# Patient Record
Sex: Female | Born: 1950 | Race: White | Hispanic: No | Marital: Married | Smoking: Never smoker
Health system: Southern US, Community
[De-identification: ages and names within clinical notes are randomized; demographics above are authoritative.]

## PROBLEM LIST (undated history)

## (undated) DIAGNOSIS — G709 Myoneural disorder, unspecified: Secondary | ICD-10-CM

## (undated) DIAGNOSIS — I10 Essential (primary) hypertension: Secondary | ICD-10-CM

## (undated) DIAGNOSIS — M1990 Unspecified osteoarthritis, unspecified site: Secondary | ICD-10-CM

## (undated) DIAGNOSIS — H539 Unspecified visual disturbance: Secondary | ICD-10-CM

## (undated) DIAGNOSIS — Z8489 Family history of other specified conditions: Secondary | ICD-10-CM

## (undated) DIAGNOSIS — F32A Depression, unspecified: Secondary | ICD-10-CM

## (undated) DIAGNOSIS — M79671 Pain in right foot: Secondary | ICD-10-CM

## (undated) DIAGNOSIS — G4700 Insomnia, unspecified: Secondary | ICD-10-CM

## (undated) DIAGNOSIS — E785 Hyperlipidemia, unspecified: Secondary | ICD-10-CM

## (undated) DIAGNOSIS — Z5189 Encounter for other specified aftercare: Secondary | ICD-10-CM

## (undated) DIAGNOSIS — R413 Other amnesia: Secondary | ICD-10-CM

## (undated) DIAGNOSIS — F329 Major depressive disorder, single episode, unspecified: Secondary | ICD-10-CM

## (undated) DIAGNOSIS — H269 Unspecified cataract: Secondary | ICD-10-CM

## (undated) HISTORY — DX: Unspecified osteoarthritis, unspecified site: M19.90

## (undated) HISTORY — DX: Myoneural disorder, unspecified: G70.9

## (undated) HISTORY — PX: FRACTURE SURGERY: SHX138

## (undated) HISTORY — DX: Major depressive disorder, single episode, unspecified: F32.9

## (undated) HISTORY — DX: Other amnesia: R41.3

## (undated) HISTORY — DX: Depression, unspecified: F32.A

## (undated) HISTORY — DX: Insomnia, unspecified: G47.00

## (undated) HISTORY — DX: Unspecified visual disturbance: H53.9

## (undated) HISTORY — DX: Encounter for other specified aftercare: Z51.89

## (undated) HISTORY — DX: Hyperlipidemia, unspecified: E78.5

## (undated) HISTORY — DX: Pain in right foot: M79.671

## (undated) HISTORY — PX: BREAST BIOPSY: SHX20

## (undated) HISTORY — PX: ROTATOR CUFF REPAIR: SHX139

## (undated) HISTORY — PX: EYE SURGERY: SHX253

## (undated) HISTORY — PX: TUBAL LIGATION: SHX77

## (undated) HISTORY — PX: REDUCTION MAMMAPLASTY: SUR839

## (undated) HISTORY — PX: COLONOSCOPY W/ POLYPECTOMY: SHX1380

## (undated) HISTORY — PX: TONSILLECTOMY: SHX5217

---

## 2005-05-06 HISTORY — PX: BREAST REDUCTION SURGERY: SHX8

## 2006-05-06 HISTORY — PX: COLONOSCOPY: SHX174

## 2011-05-07 HISTORY — PX: COLONOSCOPY W/ POLYPECTOMY: SHX1380

## 2011-11-15 HISTORY — PX: COLONOSCOPY W/ POLYPECTOMY: SHX1380

## 2011-11-15 LAB — HM COLONOSCOPY

## 2014-06-24 ENCOUNTER — Encounter: Payer: Self-pay | Admitting: Podiatrist

## 2014-06-24 ENCOUNTER — Ambulatory Visit (INDEPENDENT_AMBULATORY_CARE_PROVIDER_SITE_OTHER): Payer: BLUE CROSS/BLUE SHIELD | Admitting: Podiatrist

## 2014-06-24 ENCOUNTER — Ambulatory Visit (INDEPENDENT_AMBULATORY_CARE_PROVIDER_SITE_OTHER): Payer: BLUE CROSS/BLUE SHIELD

## 2014-06-24 VITALS — BP 154/87 | HR 69 | Resp 16

## 2014-06-24 DIAGNOSIS — S92302A Fracture of unspecified metatarsal bone(s), left foot, initial encounter for closed fracture: Secondary | ICD-10-CM

## 2014-06-24 DIAGNOSIS — M8430XA Stress fracture, unspecified site, initial encounter for fracture: Secondary | ICD-10-CM

## 2014-06-24 NOTE — Patient Instructions (Signed)
Metatarsal Stress Fracture When too much stress is put on the foot, as in running and jumping sports, the center shaft of the bones of the forefoot is very susceptible to stress fractures (break in bone). This is because of repetitive stress on the bone. This injury is more common if osteoporosis is present or if inadequate running shoes are used. Rapid increase in running distances are often the cause. Running distances should be gradually increased to avoid this problem. Shoes should be used which adequately cushion the foot. Shoes should absorb the shocks of the activity.  DIAGNOSIS  Usually the diagnosis is made by history. The foot progressively becomes sorer with activities. X-rays may be negative (show no break) within the first 2 to 3 weeks of the beginning of pain. A later X-ray may show signs of healing bone (callus formation). A bone scan or MRI will usually make the diagnosis earlier. TREATMENT AND HOME CARE INSTRUCTIONS  Treatment may or may not include a cast, removable fracture boot, or walking shoe. Casts are used for short periods of time to prevent muscle atrophy (muscle wasting).  Activities should be stopped until further advised by your caregiver.  Wear shoes with adequate shock absorbing abilities.  Alternative exercise may be undertaken while waiting for healing. These may include bicycling and swimming, or as your caregiver suggests. If you do not have a cast or splint:  You may walk on your injured foot as tolerated or advised.  Do not put any weight on your injured foot for as long as directed by your caregiver. Slowly increase the amount of time you walk on the foot as the pain allows or as advised.  Use crutches until you can bear weight without pain. A gradual increase in weight bearing may help.  Apply ice to the injury for 15-20 minutes each hour while awake for the first 2 days. Put the ice in a plastic bag and place a towel between the bag of ice and your  skin.  Only take over-the-counter or prescription medicines for pain, discomfort, or fever as directed by your caregiver. SEEK IMMEDIATE MEDICAL CARE IF:   Pain is becoming worse rather than better, or if pain is uncontrolled with medications.  You have increased swelling or redness in the foot. MAKE SURE YOU:   Understand these instructions.  Will watch your condition.  Will get help right away if you are not doing well or get worse. Document Released: 04/19/2000 Document Revised: 07/15/2011 Document Reviewed: 02/16/2008 ExitCare Patient Information 2015 [employer]. This information is not intended to replace advice given to you by your health care provider. Make sure you discuss any questions you have with your health care provider.  

## 2014-06-24 NOTE — Progress Notes (Signed)
   Subjective:    Patient ID: Brittany Salinas, female    DOB: 09-27-1950, 64 y.o.   MRN: [id number]  HPI Comments: "I was hiking and hurt my foot"  Patient c/o aching forefoot left since December 2015. She had went hiking and come home and noticed foot was hurting. Went to doc-xrayed said had fracture. She was in a boot a little over 1 month. Tried to go without the boot, was still very painful. Takes Advil prn.     Review of Systems  Eyes: Positive for visual disturbance.  Gastrointestinal: Positive for constipation.  Musculoskeletal: Positive for arthralgias.  Allergic/Immunologic: Positive for environmental allergies.  All other systems reviewed and are negative.      Objective:   Physical Exam  Patient is awake, alert, and oriented x 3.  In no acute distress.  Vascular status is intact with palpable pedal pulses at 2/4 DP and PT bilateral and capillary refill time within normal limits. Neurological sensation is also intact bilaterally via Semmes Weinstein monofilament at 5/5 sites. Light touch, vibratory sensation, Achilles tendon reflex is intact no neuroma type symptomatology is elicited.. Dermatological exam reveals skin color, turger and texture as normal. No open lesions present.  Musculature intact with dorsiflexion, plantarflexion, inversion, eversion. Pain on palpation submetatarsal 2 and 3 of the left foot are noted with pressure. No pain with tuning fork is noted. X-rays are suspicious for stress fracture second metatarsal neck left however unable to be confirmed. She states she was diagnosed previously with a fracture and therefore it likely was a stress fracture that gone on to heal.     Assessment & Plan:  Healing stress fracture left foot  Plan: Recommended continuation of the boot for 2-4 more weeks and then weaning slowly into a good supportive hiking shoe or sneaker. If her foot continues to be painful she will call and we may need to consider an MRI.

## 2014-10-12 ENCOUNTER — Other Ambulatory Visit (HOSPITAL_COMMUNITY): Payer: Self-pay | Admitting: Family Medicine

## 2014-10-12 DIAGNOSIS — Z1231 Encounter for screening mammogram for malignant neoplasm of breast: Secondary | ICD-10-CM

## 2014-10-19 ENCOUNTER — Other Ambulatory Visit (HOSPITAL_COMMUNITY): Payer: Self-pay | Admitting: Family Medicine

## 2014-10-19 ENCOUNTER — Ambulatory Visit (HOSPITAL_COMMUNITY)
Admission: RE | Admit: 2014-10-19 | Discharge: 2014-10-19 | Disposition: A | Discharge status: Home / Self Care | Payer: BLUE CROSS/BLUE SHIELD | Source: Ambulatory Visit | Attending: Family Medicine | Admitting: Family Medicine

## 2014-10-19 DIAGNOSIS — Z1231 Encounter for screening mammogram for malignant neoplasm of breast: Secondary | ICD-10-CM | POA: Insufficient documentation

## 2015-10-23 ENCOUNTER — Other Ambulatory Visit: Payer: Self-pay | Admitting: Family Medicine

## 2015-10-23 DIAGNOSIS — Z1231 Encounter for screening mammogram for malignant neoplasm of breast: Secondary | ICD-10-CM

## 2015-11-08 ENCOUNTER — Ambulatory Visit
Admission: RE | Admit: 2015-11-08 | Discharge: 2015-11-08 | Disposition: A | Discharge status: Home / Self Care | Payer: BLUE CROSS/BLUE SHIELD | Source: Ambulatory Visit | Attending: Family Medicine | Admitting: Family Medicine

## 2015-11-08 DIAGNOSIS — Z1231 Encounter for screening mammogram for malignant neoplasm of breast: Secondary | ICD-10-CM

## 2016-03-07 ENCOUNTER — Other Ambulatory Visit: Payer: Self-pay | Admitting: *Deleted

## 2016-03-07 DIAGNOSIS — M8580 Other specified disorders of bone density and structure, unspecified site: Secondary | ICD-10-CM

## 2016-03-13 ENCOUNTER — Other Ambulatory Visit: Payer: Self-pay | Admitting: Family Medicine

## 2016-03-13 ENCOUNTER — Ambulatory Visit
Admission: RE | Admit: 2016-03-13 | Discharge: 2016-03-13 | Disposition: A | Discharge status: Home / Self Care | Payer: BLUE CROSS/BLUE SHIELD | Source: Ambulatory Visit | Attending: Internal Medicine | Admitting: Internal Medicine

## 2016-03-13 DIAGNOSIS — M8580 Other specified disorders of bone density and structure, unspecified site: Secondary | ICD-10-CM

## 2016-04-18 ENCOUNTER — Encounter: Payer: Self-pay | Admitting: Neurology

## 2016-04-18 ENCOUNTER — Ambulatory Visit (INDEPENDENT_AMBULATORY_CARE_PROVIDER_SITE_OTHER): Payer: BLUE CROSS/BLUE SHIELD | Admitting: Neurology

## 2016-04-18 DIAGNOSIS — R269 Unspecified abnormalities of gait and mobility: Secondary | ICD-10-CM | POA: Diagnosis not present

## 2016-04-18 DIAGNOSIS — R413 Other amnesia: Secondary | ICD-10-CM | POA: Diagnosis not present

## 2016-04-18 DIAGNOSIS — R2681 Unsteadiness on feet: Secondary | ICD-10-CM | POA: Insufficient documentation

## 2016-04-18 DIAGNOSIS — R4189 Other symptoms and signs involving cognitive functions and awareness: Secondary | ICD-10-CM | POA: Insufficient documentation

## 2016-04-18 DIAGNOSIS — F418 Other specified anxiety disorders: Secondary | ICD-10-CM | POA: Diagnosis not present

## 2016-04-18 DIAGNOSIS — G4700 Insomnia, unspecified: Secondary | ICD-10-CM | POA: Diagnosis not present

## 2016-04-18 DIAGNOSIS — F329 Major depressive disorder, single episode, unspecified: Secondary | ICD-10-CM

## 2016-04-18 HISTORY — DX: Other symptoms and signs involving cognitive functions and awareness: R41.89

## 2016-04-18 HISTORY — DX: Unspecified abnormalities of gait and mobility: R26.9

## 2016-04-18 HISTORY — DX: Major depressive disorder, single episode, unspecified: F32.9

## 2016-04-18 NOTE — Progress Notes (Addendum)
[facility]  PATIENT: Brittany Salinas DOB: 1951/02/29  REFERRING DOCTOR OR PCP:  Ann Maki [facility]) SOURCE: patient, notes and labs from Dr. Florene Glen  _________________________________   HISTORICAL  CHIEF COMPLAINT:  Chief Complaint  Patient presents with  . Memory Loss    Sts. she has noticed more difficulty recalling past events (for ex. family vacations, family members who are deceased) over the last 4-5 yrs.  Sts. she also has difficulty remembering upcoming events. "I make lists all the time just because I'm afraid I'm going to forget something important."  Sts. she has had intermitten dizziness onset about 6 mos. ago.  Mainly when she is walking and makes a sudden turn.  Sometimes with sitting from a lying position.  MOCA and orthosatic VS done today/fim  . Dizziness    HISTORY OF PRESENT ILLNESS:  I had the pleasure seeing you patient, Brittany Salinas, at [facility] for neurologic consultation regarding her memory difficulties.   As you know, she is a 65 year old woman who has had more difficulty with memory over the last couple of years.   She has noted difficulty remembering recent events and she has trouble planning and executing a plan.   She needs to write down lists.   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    Often, if she has trouble remembering an item, she will recall with a hint or cue.   She used to read a lot but she can't recall titles and authors.  She is not getting lost but she sometimes feels she is in unfamiliar location while driving and needs to think more to get to her destination.    She has not left appliances on.    She remembers to lock her door.  She has had depression and has been off/on antidepressants x 30 years.   She stops when she feels better and then a few years later may restart.    The last time, Wellbutrin was  added 3 years ago when she felt more depressed.  She had loss of interest in pleasurable activities, apathy, increased appetite.     She is not sleeping well. Melatonin and magnesium have helped.   She averages 7 hours sleep/night.    She had left shoulder pain and had difficulty getting comfortable.   This is better but sleep is still poor. She falls asleep easily but has trouble getting back to sleep.   Sleep is wore than a few years ago.   She snores some but has never had OSA symptoms of gasping or pauses (husband has OSA).  She denies any major difficulties with her gait is sometimes feels a little off balance. She denies any difficulty with her bladder.  She has no major medical issues.    No thyroid disease or DM.    Montreal cognitive assessment was administered. She scored 26/30 losing 3 points for delayed recall. However, with category cues she was 5/5. She also lost a point for difficulty with visual spatial task.  Montreal Cognitive Assessment  04/18/2016  Visuospatial/ Executive (0/5) 4  Naming (0/3) 3  Attention: Read list of digits (0/2) 2  Attention: Read list of letters (0/1) 1  Attention: Serial 7 subtraction starting at 100 (0/3) 3  Language: Repeat phrase (0/2) 2  Language : Fluency (0/1) 1  Abstraction (0/2) 2  Delayed Recall (0/5) 2  Orientation (0/6) 6  Total 26  Adjusted Score (based on education) 26     REVIEW OF SYSTEMS: Constitutional: No fevers, chills, sweats, or change in appetite.   Insomnia Eyes: No visual changes, Salinas vision, eye pain Ear, nose and throat: No hearing loss, ear pain, nasal congestion, sore throat Cardiovascular: No chest pain, palpitations Respiratory: No shortness of breath at rest or with exertion.   No wheezes GastrointestinaI: No nausea, vomiting, diarrhea, abdominal pain, fecal incontinence Genitourinary: No dysuria, urinary retention or frequency.  No nocturia. Musculoskeletal: No neck pain, back pain Integumentary: No  rash, pruritus, skin lesions Neurological: as above Psychiatric: Notes depression at this time.  No anxiety Endocrine: No palpitations, diaphoresis, change in appetite, change in weigh or increased thirst Hematologic/Lymphatic: No anemia, purpura, petechiae. Allergic/Immunologic: No itchy/runny eyes, nasal congestion, recent allergic reactions, rashes  ALLERGIES: Allergies  Allergen Reactions  . Codeine     HOME MEDICATIONS:  Current Outpatient Prescriptions:  .  aspirin 81 MG tablet, Take 81 mg by mouth daily., Disp: , Rfl:  .  buPROPion (WELLBUTRIN XL) 300 MG 24 hr tablet, , Disp: , Rfl:  .  Multiple Vitamin (MULTIVITAMIN) capsule, Take 1 capsule by mouth daily., Disp: , Rfl:  .  NIACIN PO, Take by mouth., Disp: , Rfl:  .  Omega-3 Fatty Acids (FISH OIL PO), Take by mouth., Disp: , Rfl:  .  Red Yeast Rice Extract (RED YEAST RICE PO), Take by mouth., Disp: , Rfl:  .  DULoxetine (CYMBALTA) 60 MG capsule, Take 60 mg by mouth daily., Disp: , Rfl:   PAST MEDICAL HISTORY: Past Medical History:  Diagnosis Date  . Memory loss   . Vision abnormalities     PAST SURGICAL HISTORY: Past Surgical History:  Procedure Laterality Date  . BREAST REDUCTION SURGERY  2007  . CESAREAN SECTION     4 c-sections  . ROTATOR CUFF REPAIR Bilateral    right in 2010, left in 2017.    FAMILY HISTORY: Family History  Problem Relation Age of Onset  . Heart defect Mother   . Heart attack Father     SOCIAL HISTORY:  Social History   Social History  . Marital status: Married    Spouse name: N/A  . Number of children: N/A  . Years of education: N/A   Occupational History  . Not on file.   Social History Main Topics  . Smoking status: Never Smoker  . Smokeless tobacco: Not on file  . Alcohol use 0.0 oz/week  . Drug use: Unknown  . Sexual activity: Not on file   Other Topics Concern  . Not on file   Social History Narrative  . No narrative on file     PHYSICAL EXAM  Vitals:     04/18/16 1043  BP: (!) 158/90  Pulse: 64  Resp: 20  Weight: 176 lb 8 oz (80.1 kg)    Height:  5'5"   General: The patient is well-developed and well-nourished and in no acute distress  Eyes:  Funduscopic exam shows normal optic discs and retinal vessels.  Neck: The neck is supple, no carotid bruits are noted.  The neck is nontender.  Cardiovascular: The heart has a regular rate and rhythm with a normal S1 and S2. There were no murmurs, gallops or rubs. Lungs are clear to auscultation.  Skin: Extremities are without significant edema.  Musculoskeletal:  Back is nontender  Neurologic Exam  Mental status: The patient is alert and oriented x 3 at the time of the examination. Montreal cognitive assessment  was administered. She scored 26/30 losing 3 points for delayed recall. However, with category cues she was 5/5. She also lost a point for difficulty with visual spatial task.   Speech is normal.  Cranial nerves: Extraocular movements are full. Pupils are equal, round, and reactive to light and accomodation.  Visual fields are full.  Facial symmetry is present. There is good facial sensation to soft touch bilaterally.Facial strength is normal.  Trapezius and sternocleidomastoid strength is normal. No dysarthria is noted.  The tongue is midline, and the patient has symmetric elevation of the soft palate. No obvious hearing deficits are noted.  Motor:  Muscle bulk is normal.   Tone is normal. Strength is  5 / 5 in all 4 extremities.   Sensory: Sensory testing is intact to pinprick, soft touch and vibration sensation in arms but decreased vibration in toes.  .  Coordination: Cerebellar testing reveals good finger-nose-finger and heel-to-shin bilaterally.  Gait and station: Station is normal.   Gait is normal. Tandem gait is wide.  Romberg is negative.   Reflexes: Deep tendon reflexes are symmetric and normal in arms but increased at knees with spread.   Plantar responses are  flexor.    DIAGNOSTIC DATA (LABS, IMAGING, TESTING) - I reviewed patient records, labs, notes, testing and imaging myself where available.     ASSESSMENT AND PLAN  Memory loss - Plan: MR BRAIN WO CONTRAST, TSH, Sedimentation rate, Vitamin B12  Insomnia, unspecified type  Depression with anxiety  Gait disturbance - Plan: MR BRAIN WO CONTRAST   In summary, Brittany Salinas is a 65 year old woman who has noted decreased focus and attention over the past couple of years.   She scored a 26/30 on the Montral cognitive assessment but 3 of the 4 points that she lost was due to delayed recall. With a category cue, she was 5 out of 5 in that task implying that she has more difficulty with focus and attention and with storage of information.      Besides the mild cognitive impairment, she also had increased reflexes at her knees with spread, reduced vibration sensation in her legs, a mild gait disturbance with a wide tandem gait.    I will check an MRI of the brain to better evaluate the memory issues and the abnormal findings on examination to rule out normal pressure hydrocephalus, and MS.    Additionally we will check an ESR, TSH and B12.  Based on findings of the MRI and labs, additional studies may be necessary.    I offered to prescribe the medication to help with her insomnia but she would prefer to hold off at this time.  She will return to see me in 3-4 months or sooner if there are new or worsening neurologic symptoms.  Thank you for asking me to see Mrs. Elks for neurologic consultation. Please let me know if I can be of further assistance with her or other patients in the future.   Richard A. Felecia Shelling, MD, PhD 00/34/9179, 15:05 AM Certified in Neurology, Clinical Neurophysiology, Sleep Medicine, Pain Medicine and [facility] [address] [place], [place] [address] [phone]  Addendum:   Note faxed to Dr. Sharalyn Ink A. Felecia Shelling, MD,  PhD

## 2016-04-19 ENCOUNTER — Telehealth: Payer: Self-pay | Admitting: *Deleted

## 2016-04-19 LAB — VITAMIN B12: Vitamin B-12: 304 pg/mL (ref 232–1245)

## 2016-04-19 LAB — SEDIMENTATION RATE: SED RATE: 4 mm/h (ref 0–40)

## 2016-04-19 LAB — TSH: TSH: 3.12 u[IU]/mL (ref 0.450–4.500)

## 2016-04-19 NOTE — Telephone Encounter (Signed)
LMOM (identified vm) that per RAS, labwork done in our office is normal; she does not need to return this call unless she has questions/fim

## 2016-04-19 NOTE — Telephone Encounter (Signed)
-----   Message from Britt Bottom, MD sent at 04/19/2016 10:29 AM EST ----- Please let her know that the lab work was normal.

## 2016-04-19 NOTE — Progress Notes (Signed)
TODAY'S OV NOTES HAVE  BEEN FAXED TO [place] FAX# [phone], WITH FAX CONFIRMATION RECEIVED/fim

## 2016-05-02 ENCOUNTER — Ambulatory Visit
Admission: RE | Admit: 2016-05-02 | Discharge: 2016-05-02 | Disposition: A | Discharge status: Home / Self Care | Payer: BLUE CROSS/BLUE SHIELD | Source: Ambulatory Visit | Attending: Neurology | Admitting: Neurology

## 2016-05-02 DIAGNOSIS — R413 Other amnesia: Secondary | ICD-10-CM

## 2016-05-02 DIAGNOSIS — R269 Unspecified abnormalities of gait and mobility: Secondary | ICD-10-CM

## 2016-05-08 ENCOUNTER — Telehealth: Payer: Self-pay | Admitting: *Deleted

## 2016-05-08 NOTE — Telephone Encounter (Signed)
I have spoken with Brittany Salinas this morning and per RAS, explained that MRI brain showed atrophy, age related changes, nothing acute.  He will re-eval her at next ov and if memory is worsening, will discuss tx. options.  She verbalized understanding of same/fim

## 2016-05-08 NOTE — Telephone Encounter (Signed)
-----   Message from Britt Bottom, MD sent at 05/08/2016  9:48 AM EST ----- Please let her know that the MRI of the brain showed some atrophy and some age related changes.   Labwork was fine.    We will reevaluate her at the next visit and consider treatment if memory is worsening.

## 2016-08-21 ENCOUNTER — Encounter: Payer: Self-pay | Admitting: Neurology

## 2016-08-21 ENCOUNTER — Ambulatory Visit (INDEPENDENT_AMBULATORY_CARE_PROVIDER_SITE_OTHER): Payer: BLUE CROSS/BLUE SHIELD | Admitting: Neurology

## 2016-08-21 ENCOUNTER — Encounter (INDEPENDENT_AMBULATORY_CARE_PROVIDER_SITE_OTHER): Payer: Self-pay

## 2016-08-21 VITALS — BP 150/82 | HR 59 | Resp 16 | Ht 65.0 in | Wt 178.5 lb

## 2016-08-21 DIAGNOSIS — G4700 Insomnia, unspecified: Secondary | ICD-10-CM

## 2016-08-21 DIAGNOSIS — R413 Other amnesia: Secondary | ICD-10-CM

## 2016-08-21 DIAGNOSIS — F418 Other specified anxiety disorders: Secondary | ICD-10-CM | POA: Diagnosis not present

## 2016-08-21 DIAGNOSIS — R269 Unspecified abnormalities of gait and mobility: Secondary | ICD-10-CM

## 2016-08-21 NOTE — Progress Notes (Signed)
[facility]  PATIENT: Brittany Salinas DOB: 1950/11/09  REFERRING DOCTOR OR PCP:  Ann Maki [facility]) SOURCE: patient, notes and labs from Dr. Florene Glen  _________________________________   HISTORICAL  CHIEF COMPLAINT:  Chief Complaint  Patient presents with  . Memory Loss    Sts. she has times when she feels memory is ok, and times when she feels it isn't.  Sts. she often loses things, for example, misplaces mail once she has retrieved it from the mailbox/fim    HISTORY OF PRESENT ILLNESS:   Brittany Salinas is a 66 year old woman who Has had trouble with her memory over the past couple of years.  She has noted difficulty remembering recent events and she has trouble planning and executing a plan.   She needs to write down lists.   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    Often, if she has trouble remembering an item, she will recall with a hint or cue.   She used to read a lot but she can't recall titles and authors.  She is not getting lost but she sometimes feels she is in unfamiliar location while driving and needs to think more to get to her destination.    She has not left appliances on.    She remembers to lock her door.  She reports mild depression.   She has been off/on antidepressants x 30 years.  She is currently on Wellbutrin (has been on x several years).   Before treatment, she had loss of interest in pleasurable activities, apathy, increased appetite.     She falls asleep easily but has 1/7 nights where she wakes up and can;t fall back asleep.    Melatonin and magnesium have helped.   She averages 7 hours sleep/night.   When she wakes up, she feels refreshed.    She snores some but has never had OSA symptoms of gasping or pauses (husband has OSA).  She denies any major difficulties with her gait is sometimes feels a little off balance. She denies any difficulty  with her bladder.  She has no major medical issues.    No thyroid disease or DM.    At the last visit, Montreal cognitive assessment was administered. She scored 26/30 losing 3 points for delayed recall. However, with category cues she was 5/5. She also lost a point for difficulty with visual spatial task.  Montreal Cognitive Assessment  04/18/2016  Visuospatial/ Executive (0/5) 4  Naming (0/3) 3  Attention: Read list of digits (0/2) 2  Attention: Read list of letters (0/1) 1  Attention: Serial 7 subtraction starting at 100 (0/3) 3  Language: Repeat phrase (0/2) 2  Language : Fluency (0/1) 1  Abstraction (0/2) 2  Delayed Recall (0/5) 2  Orientation (0/6) 6  Total 26  Adjusted Score (based on education) 26     REVIEW OF SYSTEMS: Constitutional: No fevers, chills, sweats, or change in appetite.   Insomnia Eyes: No visual changes, double vision, eye pain Ear, nose and throat: No hearing loss, ear pain, nasal congestion, sore throat Cardiovascular: No chest pain, palpitations Respiratory: No shortness of breath at rest or with exertion.   No wheezes GastrointestinaI: No nausea, vomiting, diarrhea, abdominal pain, fecal incontinence Genitourinary: No dysuria, urinary retention or frequency.  No nocturia. Musculoskeletal: No neck pain, back pain Integumentary: No rash, pruritus, skin lesions Neurological: as above Psychiatric: Notes depression at this time.  No anxiety Endocrine:  No palpitations, diaphoresis, change in appetite, change in weigh or increased thirst Hematologic/Lymphatic: No anemia, purpura, petechiae. Allergic/Immunologic: No itchy/runny eyes, nasal congestion, recent allergic reactions, rashes  ALLERGIES: Allergies  Allergen Reactions  . Codeine     HOME MEDICATIONS:  Current Outpatient Prescriptions:  .  aspirin 81 MG tablet, Take 81 mg by mouth daily., Disp: , Rfl:  .  buPROPion (WELLBUTRIN XL) 300 MG 24 hr tablet, , Disp: , Rfl:  .  ESTRING 2 MG  vaginal ring, , Disp: , Rfl:  .  metoprolol succinate (TOPROL-XL) 50 MG 24 hr tablet, , Disp: , Rfl:  .  Multiple Vitamin (MULTIVITAMIN) capsule, Take 1 capsule by mouth daily., Disp: , Rfl:  .  NIACIN PO, Take by mouth., Disp: , Rfl:  .  Omega-3 Fatty Acids (FISH OIL PO), Take by mouth., Disp: , Rfl:  .  Red Yeast Rice Extract (RED YEAST RICE PO), Take by mouth., Disp: , Rfl:   PAST MEDICAL HISTORY: Past Medical History:  Diagnosis Date  . Memory loss   . Vision abnormalities     PAST SURGICAL HISTORY: Past Surgical History:  Procedure Laterality Date  . BREAST REDUCTION SURGERY  2007  . CESAREAN SECTION     4 c-sections  . ROTATOR CUFF REPAIR Bilateral    right in 2010, left in 2017.    FAMILY HISTORY: Family History  Problem Relation Age of Onset  . Heart defect Mother   . Heart attack Father     SOCIAL HISTORY:  Social History   Social History  . Marital status: Married    Spouse name: N/A  . Number of children: N/A  . Years of education: N/A   Occupational History  . Not on file.   Social History Main Topics  . Smoking status: Never Smoker  . Smokeless tobacco: Never Used  . Alcohol use 0.0 oz/week  . Drug use: Unknown  . Sexual activity: Not on file   Other Topics Concern  . Not on file   Social History Narrative  . No narrative on file     PHYSICAL EXAM  Vitals:   08/21/16 1305  BP: (!) 150/82  Pulse: (!) 59  Resp: 16  Weight: 178 lb 8 oz (81 kg)  Height: 5\' 5"  (1.651 m)    Height:  5'5"   General: The patient is well-developed and well-nourished and in no acute distress   Neurologic Exam  Mental status: The patient is alert and oriented x 3 at the time of the examination.  Attention and recall (3/3) seemed appropriate   ([id number]) during interview.   Clock slightly mis-spaced numbers.    Speech is normal.  Cranial nerves: Extraocular movements are full.  Normal facial strength and sensation..  Trapezius and  sternocleidomastoid strength is normal. No dysarthria is noted.  The tongue is midline, and the patient has symmetric elevation of the soft palate. No obvious hearing deficits are noted.  Motor:  Muscle bulk is normal.   Tone is normal. Strength is  5 / 5 in all 4 extremities.   Sensory: Sensory testing is intact to pinprick, soft touch and vibration sensation in arms but decreased vibration in toes.  .  Coordination: Cerebellar testing reveals good finger-nose-finger and heel-to-shin bilaterally.  Gait and station: Station is normal.   Gait is normal. Tandem gait is wide.  Romberg is negative.   Reflexes: Deep tendon reflexes are symmetric and normal in arms but increased at knees with spread.  DIAGNOSTIC DATA (LABS, IMAGING, TESTING) - I reviewed patient records, labs, notes, testing and imaging myself where available.     ASSESSMENT AND PLAN  Memory loss  Gait disturbance  Depression with anxiety  Insomnia, unspecified type   1.    We discussed AD/MCI trials.  They would do PET scan at screen which might definitely diagnose her. 2.   Consider Aricept  --- she prefers to hold off 3.   RTC 6 months, redo MoCA or MMSE at that visit. Call sooner if problems  Nevaen Tredway A. Felecia Shelling, MD, PhD 8/48/5927, 6:39 PM Certified in Neurology, Clinical Neurophysiology, Sleep Medicine, Pain Medicine and Neuroimaging  [facility] [address] [place], [place] [address] [phone]  Addendum:   Note faxed to Dr. Sharalyn Ink A. Felecia Shelling, MD, PhD

## 2016-11-25 ENCOUNTER — Other Ambulatory Visit: Payer: Self-pay | Admitting: Family Medicine

## 2016-11-25 DIAGNOSIS — Z1231 Encounter for screening mammogram for malignant neoplasm of breast: Secondary | ICD-10-CM

## 2016-12-06 ENCOUNTER — Ambulatory Visit
Admission: RE | Admit: 2016-12-06 | Discharge: 2016-12-06 | Disposition: A | Discharge status: Home / Self Care | Payer: BLUE CROSS/BLUE SHIELD | Source: Ambulatory Visit | Attending: Family Medicine | Admitting: Family Medicine

## 2016-12-06 DIAGNOSIS — Z1231 Encounter for screening mammogram for malignant neoplasm of breast: Secondary | ICD-10-CM

## 2017-02-19 ENCOUNTER — Telehealth: Payer: Self-pay | Admitting: *Deleted

## 2017-02-19 NOTE — Telephone Encounter (Signed)
Spoke with Suha and r/s her 02/20/17 appt. with RAS to 03/11/17 (due to provider absence.)/fim

## 2017-02-20 ENCOUNTER — Ambulatory Visit: Payer: BLUE CROSS/BLUE SHIELD | Admitting: Neurology

## 2017-03-11 ENCOUNTER — Ambulatory Visit: Payer: Self-pay | Admitting: Neurology

## 2017-05-13 ENCOUNTER — Encounter: Payer: Self-pay | Admitting: Neurology

## 2017-05-13 ENCOUNTER — Encounter (INDEPENDENT_AMBULATORY_CARE_PROVIDER_SITE_OTHER): Payer: Self-pay

## 2017-05-13 ENCOUNTER — Ambulatory Visit (INDEPENDENT_AMBULATORY_CARE_PROVIDER_SITE_OTHER): Payer: PPO | Admitting: Neurology

## 2017-05-13 DIAGNOSIS — R413 Other amnesia: Secondary | ICD-10-CM

## 2017-05-13 DIAGNOSIS — M25562 Pain in left knee: Secondary | ICD-10-CM

## 2017-05-13 DIAGNOSIS — F418 Other specified anxiety disorders: Secondary | ICD-10-CM

## 2017-05-13 DIAGNOSIS — G4700 Insomnia, unspecified: Secondary | ICD-10-CM | POA: Diagnosis not present

## 2017-05-13 HISTORY — DX: Pain in left knee: M25.562

## 2017-05-13 NOTE — Progress Notes (Signed)
[facility]  PATIENT: Brittany Salinas DOB: [birth year]  REFERRING DOCTOR OR PCP:  Ann Maki [facility]) SOURCE: patient, notes and labs from Dr. Florene Glen  _________________________________   HISTORICAL  CHIEF COMPLAINT:  Chief Complaint  Patient presents with  . Memory Loss    Sts. memory is about the same.  Good days and bad.  MOCA done today/fim    HISTORY OF PRESENT ILLNESS:   Brittany Salinas is a 68 year old woman who was reportedly difficulty with memory.  Update 05/13/2017: She feels she is doing about the same and also feels memory is doing about the same.  However, she notes that she has more good days than bad day now.   She scored 29/30 on the [facility] today (normal).    She still notes difficulty remembering what she reads.      She is sleeping better since she started taking melatonin 10 mg time release.   She falls asleep easily and stays asleep better now.      Mood is doing about the same with some depression.   She feels she gets some benefit from Welbutrin.   She has fewer tearfulness spells.     She takes fish oil, red yeast rice extract and niacin.   Her left knee has been bothering her more.      From 04/18/2016: She has noted difficulty remembering recent events and she has trouble planning and executing a plan.   She needs to write down lists.   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    Often, if she has trouble remembering an item, she will recall with a hint or cue.   She used to read a lot but she can't recall titles and authors.  She is not getting lost but she sometimes feels she is in unfamiliar location while driving and needs to think more to get to her destination.    She has not left appliances on.    She remembers to lock her door.  She reports mild depression.   She has been off/on antidepressants x 30 years.  She is currently on Wellbutrin  (has been on x several years).   Before treatment, she had loss of interest in pleasurable activities, apathy, increased appetite.     She falls asleep easily but has 1/7 nights where she wakes up and can;t fall back asleep.    Melatonin and magnesium have helped.   She averages 7 hours sleep/night.   When she wakes up, she feels refreshed.    She snores some but has never had OSA symptoms of gasping or pauses (husband has OSA).  She denies any major difficulties with her gait is sometimes feels a little off balance. She denies any difficulty with her bladder.  She has no major medical issues.    No thyroid disease or DM.    At the last visit, [place] cognitive assessment was administered. She scored 26/30 losing 3 points for delayed recall. However, with category cues she was 5/5. She also lost a point for difficulty with visual spatial task.  [place] Cognitive Assessment  05/13/2017 04/18/2016  Visuospatial/ Executive (0/5) 4 4  Naming (0/3) 3 3  Attention: Read list of digits (0/2) 2 2  Attention: Read list of letters (0/1) 1 1  Attention: Serial 7 subtraction starting at 100 (0/3) 3 3  Language: Repeat phrase (0/2) 2 2  Language : Fluency (0/1) 1 1  Abstraction (0/2) 2 2  Delayed Recall (0/5) 5 2  Orientation (0/6) 6 6  Total 29 26  Adjusted Score (based on education) 29 26     REVIEW OF SYSTEMS: Constitutional: No fevers, chills, sweats, or change in appetite.   Insomnia Eyes: No visual changes, double vision, eye pain Ear, nose and throat: No hearing loss, ear pain, nasal congestion, sore throat Cardiovascular: No chest pain, palpitations Respiratory: No shortness of breath at rest or with exertion.   No wheezes GastrointestinaI: No nausea, vomiting, diarrhea, abdominal pain, fecal incontinence Genitourinary: No dysuria, urinary retention or frequency.  No nocturia. Musculoskeletal: No neck pain, back pain Integumentary: No rash, pruritus, skin lesions Neurological: as  above Psychiatric: Notes depression at this time.  No anxiety Endocrine: No palpitations, diaphoresis, change in appetite, change in weigh or increased thirst Hematologic/Lymphatic: No anemia, purpura, petechiae. Allergic/Immunologic: No itchy/runny eyes, nasal congestion, recent allergic reactions, rashes  ALLERGIES: Allergies  Allergen Reactions  . Codeine     HOME MEDICATIONS:  Current Outpatient Medications:  .  aspirin 81 MG tablet, Take 81 mg by mouth daily., Disp: , Rfl:  .  buPROPion (WELLBUTRIN XL) 300 MG 24 hr tablet, , Disp: , Rfl:  .  ESTRING 2 MG vaginal ring, , Disp: , Rfl:  .  Melatonin 10 MG TABS, Take 10 mg by mouth at bedtime., Disp: , Rfl:  .  metoprolol succinate (TOPROL-XL) 50 MG 24 hr tablet, , Disp: , Rfl:  .  Multiple Vitamin (MULTIVITAMIN) capsule, Take 1 capsule by mouth daily., Disp: , Rfl:  .  NIACIN PO, Take by mouth., Disp: , Rfl:  .  Omega-3 Fatty Acids (FISH OIL PO), Take by mouth., Disp: , Rfl:  .  Red Yeast Rice Extract (RED YEAST RICE PO), Take by mouth., Disp: , Rfl:  .  rosuvastatin (CRESTOR) 10 MG tablet, Take 10 mg by mouth daily., Disp: , Rfl:   PAST MEDICAL HISTORY: Past Medical History:  Diagnosis Date  . Memory loss   . Vision abnormalities     PAST SURGICAL HISTORY: Past Surgical History:  Procedure Laterality Date  . BREAST BIOPSY Right    benign  . BREAST REDUCTION SURGERY  2007  . CESAREAN SECTION     4 c-sections  . REDUCTION MAMMAPLASTY Bilateral   . ROTATOR CUFF REPAIR Bilateral    right in 2010, left in 2017.    FAMILY HISTORY: Family History  Problem Relation Age of Onset  . Heart defect Mother   . Heart attack Father   . Breast cancer Maternal Aunt     SOCIAL HISTORY:  Social History   Socioeconomic History  . Marital status: Married    Spouse name: Not on file  . Number of children: Not on file  . Years of education: Not on file  . Highest education level: Not on file  Social Needs  . Financial  resource strain: Not on file  . Food insecurity - worry: Not on file  . Food insecurity - inability: Not on file  . Transportation needs - medical: Not on file  . Transportation needs - non-medical: Not on file  Occupational History  . Not on file  Tobacco Use  . Smoking status: Never Smoker  . Smokeless tobacco: Never Used  Substance and Sexual Activity  . Alcohol use: Yes    Alcohol/week: 0.0 oz  . Drug use: Not on file  . Sexual activity: Not on file  Other Topics Concern  . Not on file  Social History Narrative  . Not on file     PHYSICAL EXAM  There were no vitals filed for this visit.  Height:  5'5"   General: The patient is well-developed and well-nourished and in no acute distress   Neurologic Exam  Mental status: The patient is alert and oriented x 3 at the time of the examination.  She scored 29/30 on the MoCa test (normal)  Clock is drawn well.    Speech is normal.  Cranial nerves: Extraocular movements are full.  Normal facial strength and sensation..  Trapezius and sternocleidomastoid strength is normal. No dysarthria is noted.  The tongue is midline, and the patient has symmetric elevation of the soft palate. No obvious hearing deficits are noted.  Motor:  Muscle bulk is normal.   Tone is normal. Strength is  5 / 5 in all 4 extremities.   Sensory: Sensory testing is intact to touch  Coordination: Cerebellar testing reveals good finger-nose-finger  Gait and station: Station is normal.   Gait and tandem walk normal..   Reflexes: Deep tendon reflexes are 2 and symmetric in the arms and 3 at the knees.Marland Kitchen       DIAGNOSTIC DATA (LABS, IMAGING, TESTING) - I reviewed patient records, labs, notes, testing and imaging myself where available.     ASSESSMENT AND PLAN  Memory loss  Insomnia, unspecified type  Depression with anxiety  Left knee pain, unspecified chronicity   1.   She scored within the normal range on the [place] cognitive assessment  today. I had a long discussion with her that I believe that her cognitive Cody's were more likely due to depression and insomnia then to a degenerative process such as Alzheimer's.  2.   Continue melatonin for insomnia and Wellbutrin for depression.  3.    If left knee pain worsens she should see orthopedics  RTC prn worsening memory or other neurologic issues.  Alee Gressman A. Felecia Shelling, MD, PhD 06/14/209, 1:55 PM Certified in Neurology, Clinical Neurophysiology, Sleep Medicine, Pain Medicine and Neuroimaging  [facility] [address], [place], [place] [address] [phone]  Addendum:   Note faxed to Dr. Sharalyn Ink A. Felecia Shelling, MD, PhD

## 2017-05-29 ENCOUNTER — Telehealth: Payer: Self-pay

## 2017-05-29 NOTE — Telephone Encounter (Signed)
Copied from [place] [id number]. Topic: General - Other >> May 29, 2017 12:01 PM Yvette Rack wrote: Reason for CRM: patient would like to become a New Patient to Dr Charlett Blake states that she was referred to her by one of her patients Abel Presto not family member of patients

## 2017-05-29 NOTE — Telephone Encounter (Signed)
Please advise 

## 2017-05-29 NOTE — Telephone Encounter (Signed)
I am willing to take her on as a patient but she has to know it will be awhile for a new patient appt. If she needs something urgent she might want to try someone else in the office

## 2017-06-02 DIAGNOSIS — M25562 Pain in left knee: Secondary | ICD-10-CM | POA: Diagnosis not present

## 2017-06-03 NOTE — Telephone Encounter (Signed)
Made appt for 07/08/17 at 3:30pm with Dr. Charlett Blake. She preferred a woman and didn't want to risk getting in with Dr. Charlett Blake by seeing someone else. If this appt time is not good with Dr. Charlett Blake please let me know and I will reschedule.

## 2017-06-03 NOTE — Telephone Encounter (Signed)
[place]- you will inform Pt of Dr. Rhae Lerner decision please? If she is okay with waiting okay to schedule.

## 2017-06-03 NOTE — Telephone Encounter (Signed)
Noted! Thank you

## 2017-07-08 ENCOUNTER — Ambulatory Visit (INDEPENDENT_AMBULATORY_CARE_PROVIDER_SITE_OTHER): Payer: PPO | Admitting: Family Medicine

## 2017-07-08 ENCOUNTER — Encounter: Payer: Self-pay | Admitting: Family Medicine

## 2017-07-08 VITALS — BP 140/90 | HR 64 | Temp 98.2°F | Resp 18 | Wt 179.6 lb

## 2017-07-08 DIAGNOSIS — F329 Major depressive disorder, single episode, unspecified: Secondary | ICD-10-CM

## 2017-07-08 DIAGNOSIS — R413 Other amnesia: Secondary | ICD-10-CM | POA: Diagnosis not present

## 2017-07-08 DIAGNOSIS — G4700 Insomnia, unspecified: Secondary | ICD-10-CM

## 2017-07-08 DIAGNOSIS — F32A Depression, unspecified: Secondary | ICD-10-CM | POA: Insufficient documentation

## 2017-07-08 DIAGNOSIS — I10 Essential (primary) hypertension: Secondary | ICD-10-CM | POA: Diagnosis not present

## 2017-07-08 DIAGNOSIS — F418 Other specified anxiety disorders: Secondary | ICD-10-CM

## 2017-07-08 MED ORDER — ROSUVASTATIN CALCIUM 10 MG PO TABS: 10.0000 mg | ORAL_TABLET | Freq: Every day | ORAL | 1 refills | Status: DC

## 2017-07-08 MED ORDER — BUPROPION HCL ER (XL) 300 MG PO TB24: 300.0000 mg | ORAL_TABLET | Freq: Every day | ORAL | 1 refills | Status: DC

## 2017-07-08 MED ORDER — METOPROLOL SUCCINATE ER 50 MG PO TB24: 50.0000 mg | ORAL_TABLET | Freq: Every day | ORAL | 1 refills | Status: DC

## 2017-07-08 NOTE — Assessment & Plan Note (Addendum)
Metoprolol 50 mg daily Encouraged heart healthy diet such as the DASH diet and exercise as tolerated. Have requested records from her previous PMD.

## 2017-07-08 NOTE — Progress Notes (Signed)
Subjective:  I acted as a Education administrator for Dr. Charlett Blake. [place], [place]  Patient ID: Brittany Salinas, female    DOB: 07-18-1950, 67 y.o.   MRN: [id number]  No chief complaint on file.   HPI  Patient is in today for to establish care. She has a past medical history of hypertension, depression, insomnia, and elevated BMI. She uses Melatonin for sleep but notes it is not working as well as it used to. She has been concerned about her memory but after a work up with neurology she has accepted that it is likely more related to stress and depression than to an organic cause. She denies any recent febrile illness, injury or hospitalizations. She tries to maintain a heart healthy diet and stay active. She particularly likes working in her garden. Denies CP/palp/SOB/HA/congestion/fevers/GI or GU c/o. Taking meds as prescribed  Patient Care Team: Mosie Lukes, MD as PCP - General (Family Medicine) Teena Irani, MD (Inactive) as Consulting Physician (Gastroenterology) Jola Schmidt, MD as Consulting Physician (Ophthalmology) Reather Converse, [place] as Referring Physician (Chiropractic Medicine)   Past Medical History:  Diagnosis Date  . Arthritis    OA  . Depression   . Hyperlipidemia   . Insomnia   . Memory loss   . Vision abnormalities     Past Surgical History:  Procedure Laterality Date  . BREAST BIOPSY Right    benign  . BREAST REDUCTION SURGERY  2007  . CESAREAN SECTION     4 c-sections  . REDUCTION MAMMAPLASTY Bilateral   . ROTATOR CUFF REPAIR Bilateral    right in 2010, left in 2017.  . TONSILLECTOMY      Family History  Problem Relation Age of Onset  . Heart defect Mother        valvular heart disease  . Heart attack Father   . Hyperlipidemia Father   . Hypertension Father   . Breast cancer Maternal Aunt   . Marfan syndrome Sister   . Hypertension Brother   . Other Brother        vertigo  . Psoriasis Daughter   . Mental illness Son        depression alcohol in  past  . Depression Son   . Cancer Maternal Grandmother        stomach  . Stroke Maternal Grandfather   . Marfan syndrome Maternal Grandfather        ?  Marland Kitchen Arrhythmia Son   . Alcohol abuse Son        h/o drug abuse    Social History   Socioeconomic History  . Marital status: Married    Spouse name: Not on file  . Number of children: Not on file  . Years of education: Not on file  . Highest education level: Not on file  Social Needs  . Financial resource strain: Not on file  . Food insecurity - worry: Not on file  . Food insecurity - inability: Not on file  . Transportation needs - medical: Not on file  . Transportation needs - non-medical: Not on file  Occupational History  . Not on file  Tobacco Use  . Smoking status: Never Smoker  . Smokeless tobacco: Never Used  Substance and Sexual Activity  . Alcohol use: Yes    Alcohol/week: 0.0 oz  . Drug use: No  . Sexual activity: Not on file  Other Topics Concern  . Not on file  Social History Narrative   Works as Geophysicist/field seismologist, lives with husband  and dog, no dietary, no cigarettes, drug use. Uses occasional light alcohol    Outpatient Medications Prior to Visit  Medication Sig Dispense Refill  . aspirin 81 MG tablet Take 81 mg by mouth daily.    Marland Kitchen ESTRING 2 MG vaginal ring     . Melatonin 10 MG TABS Take 10 mg by mouth at bedtime.    . Multiple Vitamin (MULTIVITAMIN) capsule Take 1 capsule by mouth daily.    Marland Kitchen NIACIN PO Take by mouth.    . Omega-3 Fatty Acids (FISH OIL PO) Take by mouth.    Marland Kitchen buPROPion (WELLBUTRIN XL) 300 MG 24 hr tablet     . metoprolol succinate (TOPROL-XL) 50 MG 24 hr tablet     . Red Yeast Rice Extract (RED YEAST RICE PO) Take by mouth.    . rosuvastatin (CRESTOR) 10 MG tablet Take 10 mg by mouth daily.     No facility-administered medications prior to visit.     Allergies  Allergen Reactions  . Codeine     Review of Systems  Constitutional: Negative for chills, fever and  malaise/fatigue.  HENT: Negative for congestion and hearing loss.   Eyes: Negative for discharge.  Respiratory: Negative for cough, sputum production and shortness of breath.   Cardiovascular: Negative for chest pain, palpitations and leg swelling.  Gastrointestinal: Negative for abdominal pain, blood in stool, constipation, diarrhea, heartburn, nausea and vomiting.  Genitourinary: Negative for dysuria, frequency, hematuria and urgency.  Musculoskeletal: Positive for joint pain. Negative for back pain, falls and myalgias.  Skin: Negative for rash.  Neurological: Negative for dizziness, sensory change, loss of consciousness, weakness and headaches.  Endo/Heme/Allergies: Negative for environmental allergies. Does not bruise/bleed easily.  Psychiatric/Behavioral: Positive for depression and memory loss. Negative for suicidal ideas. The patient is nervous/anxious and has insomnia.        Objective:    Physical Exam  Constitutional: She is oriented to person, place, and time. She appears well-developed and well-nourished. No distress.  HENT:  Head: Normocephalic and atraumatic.  Eyes: Conjunctivae are normal.  Neck: Neck supple. No thyromegaly present.  Cardiovascular: Normal rate, regular rhythm and normal heart sounds.  No murmur heard. Pulmonary/Chest: Effort normal and breath sounds normal. No respiratory distress.  Abdominal: Soft. Bowel sounds are normal. She exhibits no distension and no mass. There is no tenderness.  Musculoskeletal: She exhibits no edema.  Lymphadenopathy:    She has no cervical adenopathy.  Neurological: She is alert and oriented to person, place, and time.  Skin: Skin is warm and dry.  Psychiatric: She has a normal mood and affect. Her behavior is normal.    BP 140/90 (BP Location: Left Arm, Patient Position: Sitting, Cuff Size: Normal)   Pulse 64   Temp 98.2 F (36.8 C) (Oral)   Resp 18   Wt 179 lb 9.6 oz (81.5 kg)   SpO2 98%   BMI 29.89 kg/m  Wt  Readings from Last 3 Encounters:  07/08/17 179 lb 9.6 oz (81.5 kg)  08/21/16 178 lb 8 oz (81 kg)  04/18/16 176 lb 8 oz (80.1 kg)   BP Readings from Last 3 Encounters:  07/08/17 140/90  08/21/16 (!) 150/82  04/18/16 (!) 158/90      There is no immunization history on file for this patient.  Health Maintenance  Topic Date Due  . Hepatitis C Screening  1950/08/09  . HIV Screening  08/29/1966  . TETANUS/TDAP  08/29/1970  . PAP SMEAR  08/28/1972  . COLONOSCOPY  08/28/2001  . PNA vac Low Risk Adult (1 of 2 - PCV13) 08/28/2016  . MAMMOGRAM  12/07/2018  . DEXA SCAN  Completed  . INFLUENZA VACCINE  Discontinued    Lab Results  Component Value Date   TSH 3.120 04/18/2016    Lab Results  Component Value Date   TSH 3.120 04/18/2016   No results found for: WBC, HGB, HCT, MCV, PLT No results found for: NA, K, CHLORIDE, CO2, GLUCOSE, BUN, CREATININE, BILITOT, ALKPHOS, AST, ALT, PROT, ALBUMIN, CALCIUM, ANIONGAP, EGFR, GFR No results found for: CHOL No results found for: HDL No results found for: LDLCALC No results found for: TRIG No results found for: CHOLHDL No results found for: HGBA1C       Assessment & Plan:   Problem List Items Addressed This Visit    Memory loss    Has had a workup with neurology and has been reassured that her memory concerns are more stress related than organic most likely. She agrees to try and stay active, eat clean, get regular exercise, sleep regularly, learn something new and report if she become more worried.       Insomnia    Encouraged good sleep hygiene such as dark, quiet room. No blue/green glowing lights such as computer screens in bedroom. No alcohol or stimulants in evening. Cut down on caffeine as able. Regular exercise is helpful but not just prior to bed time. May use Melatonin and/or L Tryptophan.       Depression with anxiety - Primary    Offered referral to [facility] for counseling. She declines medication adjustments  at this time. Continue Wellbutrin for now.       Relevant Medications   buPROPion (WELLBUTRIN XL) 300 MG 24 hr tablet   Other Relevant Orders   Ambulatory referral to [facility]: Depression   Relevant Medications   buPROPion (WELLBUTRIN XL) 300 MG 24 hr tablet   Hypertension    Metoprolol 50 mg daily Encouraged heart healthy diet such as the DASH diet and exercise as tolerated. Have requested records from her previous PMD.       Relevant Medications   metoprolol succinate (TOPROL-XL) 50 MG 24 hr tablet   rosuvastatin (CRESTOR) 10 MG tablet      I have discontinued Joy Mennen's Red Yeast Rice Extract (RED YEAST RICE PO). I have also changed her metoprolol succinate, buPROPion, and rosuvastatin. Additionally, I am having her maintain her aspirin, multivitamin, NIACIN PO, Omega-3 Fatty Acids (FISH OIL PO), ESTRING, and Melatonin.  Meds ordered this encounter  Medications  . metoprolol succinate (TOPROL-XL) 50 MG 24 hr tablet    Sig: Take 1 tablet (50 mg total) by mouth daily.    Dispense:  90 tablet    Refill:  1  . buPROPion (WELLBUTRIN XL) 300 MG 24 hr tablet    Sig: Take 1 tablet (300 mg total) by mouth daily.    Dispense:  90 tablet    Refill:  1  . rosuvastatin (CRESTOR) 10 MG tablet    Sig: Take 1 tablet (10 mg total) by mouth daily.    Dispense:  90 tablet    Refill:  1    CMA served as Education administrator during this visit. History, Physical and Plan performed by medical provider. Documentation and orders reviewed and attested to.  Penni Homans, MD

## 2017-07-08 NOTE — Patient Instructions (Addendum)
L Tryptophan caps for sleep or warm milk with cookies  Check bp daily if runs consistently over 140/90 then add a second Metoprolol each day and let us know  Preventive Care 65 Years and Older, Female Preventive care refers to lifestyle choices and visits with your health care provider that can promote health and wellness. What does preventive care include?  A yearly physical exam. This is also called an annual well check.  Dental exams once or twice a year.  Routine eye exams. Ask your health care provider how often you should have your eyes checked.  Personal lifestyle choices, including: ? Daily care of your teeth and gums. ? Regular physical activity. ? Eating a healthy diet. ? Avoiding tobacco and drug use. ? Limiting alcohol use. ? Practicing safe sex. ? Taking low-dose aspirin every day. ? Taking vitamin and mineral supplements as recommended by your health care provider. What happens during an annual well check? The services and screenings done by your health care provider during your annual well check will depend on your age, overall health, lifestyle risk factors, and family history of disease. Counseling Your health care provider may ask you questions about your:  Alcohol use.  Tobacco use.  Drug use.  Emotional well-being.  Home and relationship well-being.  Sexual activity.  Eating habits.  History of falls.  Memory and ability to understand (cognition).  Work and work Statistician.  Reproductive health.  Screening You may have the following tests or measurements:  Height, weight, and BMI.  Blood pressure.  Lipid and cholesterol levels. These may be checked every 5 years, or more frequently if you are over 62 years old.  Skin check.  Lung cancer screening. You may have this screening every year starting at age 71 if you have a 30-pack-year history of smoking and currently smoke or have quit within the past 15 years.  Fecal occult blood test  (FOBT) of the stool. You may have this test every year starting at age 50.  Flexible sigmoidoscopy or colonoscopy. You may have a sigmoidoscopy every 5 years or a colonoscopy every 10 years starting at age 39.  Hepatitis C blood test.  Hepatitis B blood test.  Sexually transmitted disease (STD) testing.  Diabetes screening. This is done by checking your blood sugar (glucose) after you have not eaten for a while (fasting). You may have this done every 1-3 years.  Bone density scan. This is done to screen for osteoporosis. You may have this done starting at age 43.  Mammogram. This may be done every 1-2 years. Talk to your health care provider about how often you should have regular mammograms.  Talk with your health care provider about your test results, treatment options, and if necessary, the need for more tests. Vaccines Your health care provider may recommend certain vaccines, such as:  Influenza vaccine. This is recommended every year.  Tetanus, diphtheria, and acellular pertussis (Tdap, Td) vaccine. You may need a Td booster every 10 years.  Varicella vaccine. You may need this if you have not been vaccinated.  Zoster vaccine. You may need this after age 93.  Measles, mumps, and rubella (MMR) vaccine. You may need at least one dose of MMR if you were born in 1957 or later. You may also need a second dose.  Pneumococcal 13-valent conjugate (PCV13) vaccine. One dose is recommended after age 21.  Pneumococcal polysaccharide (PPSV23) vaccine. One dose is recommended after age 47.  Meningococcal vaccine. You may need this if you  have certain conditions.  Hepatitis A vaccine. You may need this if you have certain conditions or if you travel or work in places where you may be exposed to hepatitis A.  Hepatitis B vaccine. You may need this if you have certain conditions or if you travel or work in places where you may be exposed to hepatitis B.  Haemophilus influenzae type b  (Hib) vaccine. You may need this if you have certain conditions.  Talk to your health care provider about which screenings and vaccines you need and how often you need them. This information is not intended to replace advice given to you by your health care provider. Make sure you discuss any questions you have with your health care provider. Document Released: 05/19/2015 Document Revised: 01/10/2016 Document Reviewed: 02/21/2015 Elsevier Interactive Patient Education  [employer].

## 2017-07-09 NOTE — Assessment & Plan Note (Signed)
Encouraged good sleep hygiene such as dark, quiet room. No blue/green glowing lights such as computer screens in bedroom. No alcohol or stimulants in evening. Cut down on caffeine as able. Regular exercise is helpful but not just prior to bed time. May use Melatonin and/or L Tryptophan.

## 2017-07-09 NOTE — Assessment & Plan Note (Signed)
Has had a workup with neurology and has been reassured that her memory concerns are more stress related than organic most likely. She agrees to try and stay active, eat clean, get regular exercise, sleep regularly, learn something new and report if she become more worried.

## 2017-07-09 NOTE — Assessment & Plan Note (Signed)
Offered referral to [facility] for counseling. She declines medication adjustments at this time. Continue Wellbutrin for now.

## 2017-08-11 DIAGNOSIS — N390 Urinary tract infection, site not specified: Secondary | ICD-10-CM | POA: Diagnosis not present

## 2017-08-18 ENCOUNTER — Ambulatory Visit: Payer: PPO | Admitting: Psychology

## 2017-10-09 ENCOUNTER — Encounter: Payer: Self-pay | Admitting: Family Medicine

## 2017-10-09 ENCOUNTER — Ambulatory Visit (INDEPENDENT_AMBULATORY_CARE_PROVIDER_SITE_OTHER): Payer: PPO | Admitting: Family Medicine

## 2017-10-09 DIAGNOSIS — F418 Other specified anxiety disorders: Secondary | ICD-10-CM

## 2017-10-09 DIAGNOSIS — M79671 Pain in right foot: Secondary | ICD-10-CM

## 2017-10-09 DIAGNOSIS — E785 Hyperlipidemia, unspecified: Secondary | ICD-10-CM | POA: Insufficient documentation

## 2017-10-09 DIAGNOSIS — K635 Polyp of colon: Secondary | ICD-10-CM | POA: Diagnosis not present

## 2017-10-09 DIAGNOSIS — I10 Essential (primary) hypertension: Secondary | ICD-10-CM

## 2017-10-09 HISTORY — DX: Pain in right foot: M79.671

## 2017-10-09 HISTORY — DX: Polyp of colon: K63.5

## 2017-10-09 LAB — COMPREHENSIVE METABOLIC PANEL
ALT: 16 U/L (ref 0–35)
AST: 18 U/L (ref 0–37)
Albumin: 4.1 g/dL (ref 3.5–5.2)
Alkaline Phosphatase: 65 U/L (ref 39–117)
BILIRUBIN TOTAL: 0.6 mg/dL (ref 0.2–1.2)
BUN: 12 mg/dL (ref 6–23)
CO2: 28 mEq/L (ref 19–32)
CREATININE: 0.78 mg/dL (ref 0.40–1.20)
Calcium: 9.3 mg/dL (ref 8.4–10.5)
Chloride: 106 mEq/L (ref 96–112)
GFR: 78.51 mL/min (ref >60.00)
Glucose, Bld: 99 mg/dL (ref 70–99)
Potassium: 4.7 mEq/L (ref 3.5–5.1)
Sodium: 141 mEq/L (ref 135–145)
Total Protein: 6.5 g/dL (ref 6.0–8.3)

## 2017-10-09 LAB — LIPID PANEL
CHOLESTEROL: 143 mg/dL (ref 0–200)
HDL: 43.4 mg/dL (ref >39.00)
LDL Cholesterol: 70 mg/dL (ref 0–99)
NonHDL: 99.57
Total CHOL/HDL Ratio: 3
Triglycerides: 148 mg/dL (ref 0.0–149.0)
VLDL: 29.6 mg/dL (ref 0.0–40.0)

## 2017-10-09 LAB — TSH: TSH: 2.88 u[IU]/mL (ref 0.35–4.50)

## 2017-10-09 LAB — CBC
HEMATOCRIT: 37.6 % (ref 36.0–46.0)
Hemoglobin: 12.7 g/dL (ref 12.0–15.0)
MCHC: 33.7 g/dL (ref 30.0–36.0)
MCV: 90.4 fl (ref 78.0–100.0)
Platelets: 211 10*3/uL (ref 150.0–400.0)
RBC: 4.16 Mil/uL (ref 3.87–5.11)
RDW: 12.7 % (ref 11.5–15.5)
WBC: 5.7 10*3/uL (ref 4.0–10.5)

## 2017-10-09 MED ORDER — METOPROLOL SUCCINATE ER 50 MG PO TB24: 50.0000 mg | ORAL_TABLET | Freq: Every day | ORAL | 1 refills | Status: DC

## 2017-10-09 MED ORDER — BUPROPION HCL ER (XL) 300 MG PO TB24: 300.0000 mg | ORAL_TABLET | Freq: Every day | ORAL | 1 refills | Status: DC

## 2017-10-09 MED ORDER — ROSUVASTATIN CALCIUM 10 MG PO TABS: 10.0000 mg | ORAL_TABLET | Freq: Every day | ORAL | 1 refills | Status: DC

## 2017-10-09 NOTE — Assessment & Plan Note (Signed)
She believes it is time for a repeat colonoscopy have requested records and am referring her back to Dr Amedeo Plenty whom she has seen in past

## 2017-10-09 NOTE — Assessment & Plan Note (Signed)
Well controlled, no changes to meds. Encouraged heart healthy diet such as the DASH diet and exercise as tolerated.  °

## 2017-10-09 NOTE — Assessment & Plan Note (Signed)
Doing better with the increased sun and time in the garden is having her first appt with [facility] tomorrow. Wellbutrin refilled

## 2017-10-09 NOTE — Progress Notes (Signed)
Subjective:  I acted as a Education administrator for Dr. Charlett Blake. [place], [place]  Patient ID: Brittany Salinas, female    DOB: [birth year], 67 y.o.   MRN: [id number]  No chief complaint on file.   HPI  Patient is in today for 3 month follow up and she is doing well. No recent febrile illness or hospitalizations. Is having trouble with increased right foot pain.  This is the foot that historically has had a bunion and some arthritis with irregular toes.  And she splints between the toes her pain is better.  This past month there is been no injury but her pain is increased is most notable at the base of the third and fourth toes.  She denies any redness or warmth.  She has not tried any significant over-the-counter meds. Denies CP/palp/SOB/HA/congestion/fevers/GI or GU c/o. Taking meds as prescribed  Patient Care Team: Mosie Lukes, MD as PCP - General (Family Medicine) Teena Irani, MD (Inactive) as Consulting Physician (Gastroenterology) Jola Schmidt, MD as Consulting Physician (Ophthalmology) Reather Converse, [place] as Referring Physician (Chiropractic Medicine)   Past Medical History:  Diagnosis Date  . Arthritis    OA  . Depression   . Hyperlipidemia   . Insomnia   . Memory loss   . Right foot pain 10/09/2017  . Vision abnormalities     Past Surgical History:  Procedure Laterality Date  . BREAST BIOPSY Right    benign  . BREAST REDUCTION SURGERY  2007  . CESAREAN SECTION     4 c-sections  . REDUCTION MAMMAPLASTY Bilateral   . ROTATOR CUFF REPAIR Bilateral    right in 2010, left in 2017.  . TONSILLECTOMY      Family History  Problem Relation Age of Onset  . Heart defect Mother        valvular heart disease  . Heart attack Father   . Hyperlipidemia Father   . Hypertension Father   . Breast cancer Maternal Aunt   . Marfan syndrome Sister   . Hypertension Brother   . Other Brother        vertigo  . Psoriasis Daughter   . Mental illness Son        depression alcohol in past    . Depression Son   . Cancer Maternal Grandmother        stomach  . Stroke Maternal Grandfather   . Marfan syndrome Maternal Grandfather        ?  Marland Kitchen Arrhythmia Son   . Alcohol abuse Son        h/o drug abuse    Social History   Socioeconomic History  . Marital status: Married    Spouse name: Not on file  . Number of children: Not on file  . Years of education: Not on file  . Highest education level: Not on file  Occupational History  . Not on file  Social Needs  . Financial resource strain: Not on file  . Food insecurity:    Worry: Not on file    Inability: Not on file  . Transportation needs:    Medical: Not on file    Non-medical: Not on file  Tobacco Use  . Smoking status: Never Smoker  . Smokeless tobacco: Never Used  Substance and Sexual Activity  . Alcohol use: Yes    Alcohol/week: 0.0 oz  . Drug use: No  . Sexual activity: Not on file  Lifestyle  . Physical activity:    Days per week: Not on  file    Minutes per session: Not on file  . Stress: Not on file  Relationships  . Social connections:    Talks on phone: Not on file    Gets together: Not on file    Attends religious service: Not on file    Active member of club or organization: Not on file    Attends meetings of clubs or organizations: Not on file    Relationship status: Not on file  . Intimate partner violence:    Fear of current or ex partner: Not on file    Emotionally abused: Not on file    Physically abused: Not on file    Forced sexual activity: Not on file  Other Topics Concern  . 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 Uses occasional light alcohol    Outpatient Medications Prior to Visit  Medication Sig Dispense Refill  . aspirin 81 MG tablet Take 81 mg by mouth daily.    Marland Kitchen ESTRING 2 MG vaginal ring     . Melatonin 10 MG TABS Take 10 mg by mouth at bedtime.    . Multiple Vitamin  (MULTIVITAMIN) capsule Take 1 capsule by mouth daily.    Marland Kitchen NIACIN PO Take by mouth.    . Omega-3 Fatty Acids (FISH OIL PO) Take by mouth.    Marland Kitchen buPROPion (WELLBUTRIN XL) 300 MG 24 hr tablet Take 1 tablet (300 mg total) by mouth daily. 90 tablet 1  . metoprolol succinate (TOPROL-XL) 50 MG 24 hr tablet Take 1 tablet (50 mg total) by mouth daily. 90 tablet 1  . rosuvastatin (CRESTOR) 10 MG tablet Take 1 tablet (10 mg total) by mouth daily. 90 tablet 1   No facility-administered medications prior to visit.     Allergies  Allergen Reactions  . Codeine     Review of Systems  Constitutional: Negative for fever and malaise/fatigue.  HENT: Positive for tinnitus. Negative for congestion.   Eyes: Negative for blurred vision.  Respiratory: Negative for shortness of breath.   Cardiovascular: Negative for chest pain, palpitations and leg swelling.  Gastrointestinal: Negative for abdominal pain, blood in stool and nausea.  Genitourinary: Negative for dysuria and frequency.  Musculoskeletal: Positive for joint pain. Negative for falls.  Skin: Negative for rash.  Neurological: Negative for dizziness, loss of consciousness and headaches.  Endo/Heme/Allergies: Negative for environmental allergies.  Psychiatric/Behavioral: Negative for depression. The patient is not nervous/anxious.        Objective:    Physical Exam  Constitutional: She is oriented to person, place, and time. She appears well-developed and well-nourished. No distress.  HENT:  Head: Normocephalic and atraumatic.  Nose: Nose normal.  Eyes: Right eye exhibits no discharge. Left eye exhibits no discharge.  Neck: Normal range of motion. Neck supple.  Cardiovascular: Normal rate and regular rhythm.  No murmur heard. Pulmonary/Chest: Effort normal and breath sounds normal.  Abdominal: Soft. Bowel sounds are normal. There is no tenderness.  Musculoskeletal: She exhibits no edema.  Neurological: She is alert and oriented to person,  place, and time.  Skin: Skin is warm and dry.  Psychiatric: She has a normal mood and affect.  Nursing note and vitals reviewed.   BP 120/70 (BP Location: Left Arm, Patient Position: Sitting, Cuff Size: Normal)   Pulse (!) 59   Temp 98.1 F (36.7 C) (Oral)   Resp 18   Wt 176 lb 6.4  oz (80 kg)   HC 18" (45.7 cm)   SpO2 98%   BMI 29.35 kg/m  Wt Readings from Last 3 Encounters:  10/09/17 176 lb 6.4 oz (80 kg)  07/08/17 179 lb 9.6 oz (81.5 kg)  08/21/16 178 lb 8 oz (81 kg)   BP Readings from Last 3 Encounters:  10/09/17 120/70  07/08/17 140/90  08/21/16 (!) 150/82      There is no immunization history on file for this patient.  Health Maintenance  Topic Date Due  . Hepatitis C Screening  27-May-1950  . TETANUS/TDAP  08/29/1970  . COLONOSCOPY  08/28/2001  . PNA vac Low Risk Adult (1 of 2 - PCV13) 08/28/2016  . MAMMOGRAM  12/07/2018  . DEXA SCAN  Completed  . INFLUENZA VACCINE  Discontinued    Lab Results  Component Value Date   TSH 3.120 04/18/2016    Lab Results  Component Value Date   TSH 3.120 04/18/2016   No results found for: WBC, HGB, HCT, MCV, PLT No results found for: NA, K, CHLORIDE, CO2, GLUCOSE, BUN, CREATININE, BILITOT, ALKPHOS, AST, ALT, PROT, ALBUMIN, CALCIUM, ANIONGAP, EGFR, GFR No results found for: CHOL No results found for: HDL No results found for: LDLCALC No results found for: TRIG No results found for: CHOLHDL No results found for: HGBA1C       Assessment & Plan:   Problem List Items Addressed This Visit    Depression with anxiety    Doing better with the increased sun and time in the garden is having her first appt with [facility] tomorrow. Wellbutrin refilled      Relevant Medications   buPROPion (WELLBUTRIN XL) 300 MG 24 hr tablet   Hypertension    Well controlled, no changes to meds. Encouraged heart healthy diet such as the DASH diet and exercise as tolerated.       Relevant Medications   metoprolol succinate (TOPROL-XL) 50 MG 24  hr tablet   rosuvastatin (CRESTOR) 10 MG tablet   Other Relevant Orders   CBC   Comprehensive metabolic panel   TSH   CBC   Comprehensive metabolic panel   TSH   Hyperlipidemia    Tolerating statin, encouraged heart healthy diet, avoid trans fats, minimize simple carbs and saturated fats. Increase exercise as tolerated      Relevant Medications   metoprolol succinate (TOPROL-XL) 50 MG 24 hr tablet   rosuvastatin (CRESTOR) 10 MG tablet   Other Relevant Orders   Lipid panel   Lipid panel   Right foot pain    Worst at base of 3rd and 4th metatarsals over past month. 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 Good shoes help but extensive walking makes it worse. No redness or warmth. Will try Lidocaine gel and ice. Use good shoes. Will consider referral to podiatry if worsens      Colon polyp    She believes it is time for a repeat colonoscopy have requested records and am referring her back to Dr Amedeo Plenty whom she has seen in past      Relevant Orders   Ambulatory referral to Gastroenterology      I am having Audley Hose maintain her aspirin, multivitamin, NIACIN PO, Omega-3 Fatty Acids (FISH OIL PO), ESTRING, Melatonin, buPROPion, metoprolol succinate, and rosuvastatin.  Meds ordered this encounter  Medications  . buPROPion (WELLBUTRIN XL) 300 MG 24 hr tablet    Sig: Take 1 tablet (300 mg total) by mouth daily.  Dispense:  90 tablet    Refill:  1  . metoprolol succinate (TOPROL-XL) 50 MG 24 hr tablet    Sig: Take 1 tablet (50 mg total) by mouth daily.    Dispense:  90 tablet    Refill:  1  . rosuvastatin (CRESTOR) 10 MG tablet    Sig: Take 1 tablet (10 mg total) by mouth daily.    Dispense:  90 tablet    Refill:  1    CMA served as Education administrator during this visit. History, Physical and Plan performed by medical provider. Documentation and orders reviewed and attested to.  Penni Homans, MD

## 2017-10-09 NOTE — Assessment & Plan Note (Signed)
Tolerating statin, encouraged heart healthy diet, avoid trans fats, minimize simple carbs and saturated fats. Increase exercise as tolerated 

## 2017-10-09 NOTE — Patient Instructions (Signed)
Lidocaine gel, ICY Hot, [place] or Aspercreme companies all make it.  Foot Pain Many things can cause foot pain. Some common causes are:  An injury.  A sprain.  Arthritis.  Blisters.  Bunions.  Follow these instructions at home: Pay attention to any changes in your symptoms. Take these actions to help with your discomfort:  If directed, put ice on the affected area: ? Put ice in a plastic bag. ? Place a towel between your skin and the bag. ? Leave the ice on for 15-20 minutes, 3?4 times a day for 2 days.  Take over-the-counter and prescription medicines only as told by your health care provider.  Wear comfortable, supportive shoes that fit you well. Do not wear high heels.  Do not stand or walk for long periods of time.  Do not lift a lot of weight. This can put added pressure on your feet.  Do stretches to relieve foot pain and stiffness as told by your health care provider.  Rub your foot gently.  Keep your feet clean and dry.  Contact a health care provider if:  Your pain does not get better after a few days of self-care.  Your pain gets worse.  You cannot stand on your foot. Get help right away if:  Your foot is numb or tingling.  Your foot or toes are swollen.  Your foot or toes turn white or blue.  You have warmth and redness along your foot. This information is not intended to replace advice given to you by your health care provider. Make sure you discuss any questions you have with your health care provider. Document Released: 05/19/2015 Document Revised: 09/28/2015 Document Reviewed: 05/18/2014 Elsevier Interactive Patient Education  [employer].

## 2017-10-09 NOTE — Assessment & Plan Note (Signed)
Worst at base of 3rd and 4th metatarsals over past month. 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 Good shoes help but extensive walking makes it worse. No redness or warmth. Will try Lidocaine gel and ice. Use good shoes. Will consider referral to podiatry if worsens

## 2017-10-10 ENCOUNTER — Ambulatory Visit (INDEPENDENT_AMBULATORY_CARE_PROVIDER_SITE_OTHER): Payer: PPO | Admitting: Psychology

## 2017-10-10 DIAGNOSIS — F331 Major depressive disorder, recurrent, moderate: Secondary | ICD-10-CM

## 2017-10-21 ENCOUNTER — Ambulatory Visit (INDEPENDENT_AMBULATORY_CARE_PROVIDER_SITE_OTHER): Payer: PPO | Admitting: Psychology

## 2017-10-21 DIAGNOSIS — F331 Major depressive disorder, recurrent, moderate: Secondary | ICD-10-CM

## 2017-10-31 ENCOUNTER — Encounter: Payer: Self-pay | Admitting: Family Medicine

## 2017-11-09 DIAGNOSIS — N390 Urinary tract infection, site not specified: Secondary | ICD-10-CM | POA: Diagnosis not present

## 2017-11-14 DIAGNOSIS — S20212A Contusion of left front wall of thorax, initial encounter: Secondary | ICD-10-CM | POA: Diagnosis not present

## 2017-11-20 ENCOUNTER — Ambulatory Visit (INDEPENDENT_AMBULATORY_CARE_PROVIDER_SITE_OTHER): Payer: PPO | Admitting: Psychology

## 2017-11-20 DIAGNOSIS — F331 Major depressive disorder, recurrent, moderate: Secondary | ICD-10-CM

## 2017-12-02 ENCOUNTER — Ambulatory Visit (INDEPENDENT_AMBULATORY_CARE_PROVIDER_SITE_OTHER): Payer: PPO | Admitting: Psychology

## 2017-12-02 DIAGNOSIS — F331 Major depressive disorder, recurrent, moderate: Secondary | ICD-10-CM | POA: Diagnosis not present

## 2017-12-16 ENCOUNTER — Ambulatory Visit (INDEPENDENT_AMBULATORY_CARE_PROVIDER_SITE_OTHER): Payer: PPO | Admitting: Psychology

## 2017-12-16 DIAGNOSIS — F331 Major depressive disorder, recurrent, moderate: Secondary | ICD-10-CM

## 2017-12-24 ENCOUNTER — Encounter: Payer: Self-pay | Admitting: Family Medicine

## 2017-12-29 NOTE — Telephone Encounter (Signed)
I am showing that she saw Dr. Amedeo Plenty at [facility], however it shows that she refused the appt when [facility] contacted her to schedule again.

## 2017-12-30 ENCOUNTER — Ambulatory Visit: Payer: PPO | Admitting: Psychology

## 2017-12-30 DIAGNOSIS — F331 Major depressive disorder, recurrent, moderate: Secondary | ICD-10-CM

## 2018-01-13 ENCOUNTER — Ambulatory Visit: Payer: PPO | Admitting: Psychology

## 2018-01-13 DIAGNOSIS — F331 Major depressive disorder, recurrent, moderate: Secondary | ICD-10-CM | POA: Diagnosis not present

## 2018-01-27 ENCOUNTER — Ambulatory Visit: Payer: PPO | Admitting: Psychology

## 2018-01-27 DIAGNOSIS — F331 Major depressive disorder, recurrent, moderate: Secondary | ICD-10-CM | POA: Diagnosis not present

## 2018-02-10 ENCOUNTER — Ambulatory Visit: Payer: PPO | Admitting: Psychology

## 2018-02-10 DIAGNOSIS — F331 Major depressive disorder, recurrent, moderate: Secondary | ICD-10-CM

## 2018-02-11 DIAGNOSIS — D235 Other benign neoplasm of skin of trunk: Secondary | ICD-10-CM | POA: Diagnosis not present

## 2018-02-11 DIAGNOSIS — D225 Melanocytic nevi of trunk: Secondary | ICD-10-CM | POA: Diagnosis not present

## 2018-02-11 DIAGNOSIS — L718 Other rosacea: Secondary | ICD-10-CM | POA: Diagnosis not present

## 2018-02-11 DIAGNOSIS — D485 Neoplasm of uncertain behavior of skin: Secondary | ICD-10-CM | POA: Diagnosis not present

## 2018-02-11 DIAGNOSIS — L821 Other seborrheic keratosis: Secondary | ICD-10-CM | POA: Diagnosis not present

## 2018-02-11 DIAGNOSIS — D2271 Melanocytic nevi of right lower limb, including hip: Secondary | ICD-10-CM | POA: Diagnosis not present

## 2018-02-16 ENCOUNTER — Telehealth: Payer: Self-pay | Admitting: *Deleted

## 2018-02-16 NOTE — Telephone Encounter (Signed)
Received Dermatopathology Report results from [facility]; forwarded to provider/SLS 10/14

## 2018-02-24 ENCOUNTER — Ambulatory Visit: Payer: PPO | Admitting: Psychology

## 2018-03-02 ENCOUNTER — Ambulatory Visit (INDEPENDENT_AMBULATORY_CARE_PROVIDER_SITE_OTHER): Payer: PPO | Admitting: Psychology

## 2018-03-02 DIAGNOSIS — F331 Major depressive disorder, recurrent, moderate: Secondary | ICD-10-CM | POA: Diagnosis not present

## 2018-03-17 ENCOUNTER — Ambulatory Visit (INDEPENDENT_AMBULATORY_CARE_PROVIDER_SITE_OTHER): Payer: PPO | Admitting: Psychology

## 2018-03-17 DIAGNOSIS — F331 Major depressive disorder, recurrent, moderate: Secondary | ICD-10-CM | POA: Diagnosis not present

## 2018-03-30 ENCOUNTER — Ambulatory Visit (INDEPENDENT_AMBULATORY_CARE_PROVIDER_SITE_OTHER): Payer: PPO | Admitting: Psychology

## 2018-03-30 DIAGNOSIS — F331 Major depressive disorder, recurrent, moderate: Secondary | ICD-10-CM | POA: Diagnosis not present

## 2018-04-13 ENCOUNTER — Encounter (HOSPITAL_BASED_OUTPATIENT_CLINIC_OR_DEPARTMENT_OTHER): Payer: Self-pay

## 2018-04-13 ENCOUNTER — Encounter: Payer: Self-pay | Admitting: Family Medicine

## 2018-04-13 ENCOUNTER — Ambulatory Visit (HOSPITAL_BASED_OUTPATIENT_CLINIC_OR_DEPARTMENT_OTHER)
Admission: RE | Admit: 2018-04-13 | Discharge: 2018-04-13 | Disposition: A | Discharge status: Home / Self Care | Payer: PPO | Source: Ambulatory Visit | Attending: Family Medicine | Admitting: Family Medicine

## 2018-04-13 ENCOUNTER — Ambulatory Visit (INDEPENDENT_AMBULATORY_CARE_PROVIDER_SITE_OTHER): Payer: PPO | Admitting: Family Medicine

## 2018-04-13 ENCOUNTER — Other Ambulatory Visit (HOSPITAL_COMMUNITY)
Admission: RE | Admit: 2018-04-13 | Discharge: 2018-04-13 | Disposition: A | Discharge status: Home / Self Care | Payer: PPO | Source: Ambulatory Visit | Attending: Family Medicine | Admitting: Family Medicine

## 2018-04-13 VITALS — BP 142/90 | HR 64 | Temp 98.1°F | Resp 18 | Ht 64.0 in | Wt 181.2 lb

## 2018-04-13 DIAGNOSIS — E785 Hyperlipidemia, unspecified: Secondary | ICD-10-CM

## 2018-04-13 DIAGNOSIS — Z0000 Encounter for general adult medical examination without abnormal findings: Secondary | ICD-10-CM | POA: Diagnosis not present

## 2018-04-13 DIAGNOSIS — Z124 Encounter for screening for malignant neoplasm of cervix: Secondary | ICD-10-CM | POA: Diagnosis not present

## 2018-04-13 DIAGNOSIS — F418 Other specified anxiety disorders: Secondary | ICD-10-CM | POA: Diagnosis not present

## 2018-04-13 DIAGNOSIS — E669 Obesity, unspecified: Secondary | ICD-10-CM

## 2018-04-13 DIAGNOSIS — I10 Essential (primary) hypertension: Secondary | ICD-10-CM

## 2018-04-13 DIAGNOSIS — Z1231 Encounter for screening mammogram for malignant neoplasm of breast: Secondary | ICD-10-CM | POA: Insufficient documentation

## 2018-04-13 DIAGNOSIS — Z1239 Encounter for other screening for malignant neoplasm of breast: Secondary | ICD-10-CM

## 2018-04-13 DIAGNOSIS — K635 Polyp of colon: Secondary | ICD-10-CM

## 2018-04-13 DIAGNOSIS — N760 Acute vaginitis: Secondary | ICD-10-CM | POA: Insufficient documentation

## 2018-04-13 DIAGNOSIS — N761 Subacute and chronic vaginitis: Secondary | ICD-10-CM

## 2018-04-13 HISTORY — DX: Obesity, unspecified: E66.9

## 2018-04-13 HISTORY — DX: Acute vaginitis: N76.0

## 2018-04-13 LAB — COMPREHENSIVE METABOLIC PANEL
ALBUMIN: 4.4 g/dL (ref 3.5–5.2)
ALT: 15 U/L (ref 0–35)
AST: 17 U/L (ref 0–37)
Alkaline Phosphatase: 73 U/L (ref 39–117)
BUN: 11 mg/dL (ref 6–23)
CALCIUM: 9.1 mg/dL (ref 8.4–10.5)
CO2: 27 mEq/L (ref 19–32)
Chloride: 105 mEq/L (ref 96–112)
Creatinine, Ser: 0.73 mg/dL (ref 0.40–1.20)
GFR: 84.61 mL/min (ref >60.00)
Glucose, Bld: 89 mg/dL (ref 70–99)
Potassium: 3.7 mEq/L (ref 3.5–5.1)
Sodium: 140 mEq/L (ref 135–145)
Total Bilirubin: 0.6 mg/dL (ref 0.2–1.2)
Total Protein: 6.9 g/dL (ref 6.0–8.3)

## 2018-04-13 LAB — LIPID PANEL
Cholesterol: 167 mg/dL (ref 0–200)
HDL: 53.9 mg/dL (ref >39.00)
NonHDL: 112.76
TRIGLYCERIDES: 222 mg/dL — AB (ref 0.0–149.0)
Total CHOL/HDL Ratio: 3
VLDL: 44.4 mg/dL — ABNORMAL HIGH (ref 0.0–40.0)

## 2018-04-13 LAB — CBC
HCT: 41.1 % (ref 36.0–46.0)
HEMOGLOBIN: 13.7 g/dL (ref 12.0–15.0)
MCHC: 33.4 g/dL (ref 30.0–36.0)
MCV: 89.9 fl (ref 78.0–100.0)
Platelets: 218 10*3/uL (ref 150.0–400.0)
RBC: 4.57 Mil/uL (ref 3.87–5.11)
RDW: 12.4 % (ref 11.5–15.5)
WBC: 7.2 10*3/uL (ref 4.0–10.5)

## 2018-04-13 LAB — TSH: TSH: 2.89 u[IU]/mL (ref 0.35–4.50)

## 2018-04-13 LAB — LDL CHOLESTEROL, DIRECT: Direct LDL: 86 mg/dL

## 2018-04-13 MED ORDER — ESTRING 2 MG VA RING: 2.0000 mg | VAGINAL_RING | VAGINAL | 3 refills | Status: DC

## 2018-04-13 NOTE — Assessment & Plan Note (Addendum)
Pap today, some thickening noted on right labia.if patient willing will refer to gynecology for further consideration.

## 2018-04-13 NOTE — Assessment & Plan Note (Addendum)
Patient encouraged to maintain heart healthy diet, regular exercise, adequate sleep. Consider daily probiotics. Take medications as prescribed. Flu shot given

## 2018-04-13 NOTE — Assessment & Plan Note (Signed)
Encouraged heart healthy diet, increase exercise, avoid trans fats, consider a krill oil cap daily 

## 2018-04-13 NOTE — Assessment & Plan Note (Signed)
On Wellbutrin and struggling some but she thinks it is part of her weight gain making her depressed and eat more.

## 2018-04-13 NOTE — Assessment & Plan Note (Signed)
Last colonoscopy at [facility] in 2013 in [place] was told to follow up in 3 years. Wants to transfer here. Will request old records again.

## 2018-04-13 NOTE — Assessment & Plan Note (Addendum)
Well controlled, no changes to meds. Encouraged heart healthy diet such as the DASH diet and exercise as tolerated. Improved  Recheck

## 2018-04-13 NOTE — Patient Instructions (Addendum)
BP<145/95 or call Shingrix 2 shots over 2-6 months at pharmacy  [place] stands for "Dietary Approaches to Stop Hypertension." The DASH eating plan is a healthy eating plan that has been shown to reduce high blood pressure (hypertension). It may also reduce your risk for type 2 diabetes, heart disease, and stroke. The DASH eating plan may also help with weight loss. What are tips for following this plan? General guidelines  Avoid eating more than 2,300 mg (milligrams) of salt (sodium) a day. If you have hypertension, you may need to reduce your sodium intake to 1,500 mg a day.  Limit alcohol intake to no more than 1 drink a day for nonpregnant women and 2 drinks a day for men. One drink equals 12 oz of beer, 5 oz of wine, or 1 oz of hard liquor.  Work with your health care provider to maintain a healthy body weight or to lose weight. Ask what an ideal weight is for you.  Get at least 30 minutes of exercise that causes your heart to beat faster (aerobic exercise) most days of the week. Activities may include walking, swimming, or biking.  Work with your health care provider or diet and nutrition specialist (dietitian) to adjust your eating plan to your individual calorie needs. Reading food labels  Check food labels for the amount of sodium per serving. Choose foods with less than 5 percent of the Daily Value of sodium. Generally, foods with less than 300 mg of sodium per serving fit into this eating plan.  To find whole grains, look for the word "whole" as the first word in the ingredient list. Shopping  Buy products labeled as "low-sodium" or "no salt added."  Buy fresh foods. Avoid canned foods and premade or frozen meals. Cooking  Avoid adding salt when cooking. Use salt-free seasonings or herbs instead of table salt or sea salt. Check with your health care provider or pharmacist before using salt substitutes.  Do not fry foods. Cook foods using healthy methods such as  baking, boiling, grilling, and broiling instead.  Cook with heart-healthy oils, such as olive, canola, soybean, or sunflower oil. Meal planning   Eat a balanced diet that includes: ? 5 or more servings of fruits and vegetables each day. At each meal, try to fill half of your plate with fruits and vegetables. ? Up to 6-8 servings of whole grains each day. ? Less than 6 oz of lean meat, poultry, or fish each day. A 3-oz serving of meat is about the same size as a deck of cards. One egg equals 1 oz. ? 2 servings of low-fat dairy each day. ? A serving of nuts, seeds, or beans 5 times each week. ? Heart-healthy fats. Healthy fats called Omega-3 fatty acids are found in foods such as flaxseeds and coldwater fish, like sardines, salmon, and mackerel.  Limit how much you eat of the following: ? Canned or prepackaged foods. ? Food that is high in trans fat, such as fried foods. ? Food that is high in saturated fat, such as fatty meat. ? Sweets, desserts, sugary drinks, and other foods with added sugar. ? Full-fat dairy products.  Do not salt foods before eating.  Try to eat at least 2 vegetarian meals each week.  Eat more home-cooked food and less restaurant, buffet, and fast food.  When eating at a restaurant, ask that your food be prepared with less salt or no salt, if possible. What foods are recommended? The items listed  may not be a complete list. Talk with your dietitian about what dietary choices are best for you. Grains Whole-grain or whole-wheat bread. Whole-grain or whole-wheat pasta. Brown rice. [place]. Bulgur. Whole-grain and low-sodium cereals. Pita bread. Low-fat, low-sodium crackers. Whole-wheat flour tortillas. Vegetables Fresh or frozen vegetables (raw, steamed, roasted, or grilled). Low-sodium or reduced-sodium tomato and vegetable juice. Low-sodium or reduced-sodium tomato sauce and tomato paste. Low-sodium or reduced-sodium canned vegetables. Fruits All fresh,  dried, or frozen fruit. Canned fruit in natural juice (without added sugar). Meat and other protein foods Skinless chicken or [place]. Ground chicken or [place]. Pork with fat trimmed off. Fish and seafood. Egg whites. Dried beans, peas, or lentils. Unsalted nuts, nut butters, and seeds. Unsalted canned beans. Lean cuts of beef with fat trimmed off. Low-sodium, lean deli meat. Dairy Low-fat (1%) or fat-free (skim) milk. Fat-free, low-fat, or reduced-fat cheeses. Nonfat, low-sodium ricotta or cottage cheese. Low-fat or nonfat yogurt. Low-fat, low-sodium cheese. Fats and oils Soft margarine without trans fats. Vegetable oil. Low-fat, reduced-fat, or light mayonnaise and salad dressings (reduced-sodium). Canola, safflower, olive, soybean, and sunflower oils. Avocado. Seasoning and other foods Herbs. Spices. Seasoning mixes without salt. Unsalted popcorn and pretzels. Fat-free sweets. What foods are not recommended? The items listed may not be a complete list. Talk with your dietitian about what dietary choices are best for you. Grains Baked goods made with fat, such as croissants, muffins, or some breads. Dry pasta or rice meal packs. Vegetables Creamed or fried vegetables. Vegetables in a cheese sauce. Regular canned vegetables (not low-sodium or reduced-sodium). Regular canned tomato sauce and paste (not low-sodium or reduced-sodium). Regular tomato and vegetable juice (not low-sodium or reduced-sodium). Angie Fava. Olives. Fruits Canned fruit in a light or heavy syrup. Fried fruit. Fruit in cream or butter sauce. Meat and other protein foods Fatty cuts of meat. Ribs. Fried meat. Berniece Salines. Sausage. Bologna and other processed lunch meats. Salami. Fatback. Hotdogs. Bratwurst. Salted nuts and seeds. Canned beans with added salt. Canned or smoked fish. Whole eggs or egg yolks. Chicken or [place] with skin. Dairy Whole or 2% milk, cream, and half-and-half. Whole or full-fat cream cheese. Whole-fat or sweetened  yogurt. Full-fat cheese. Nondairy creamers. Whipped toppings. Processed cheese and cheese spreads. Fats and oils Butter. Stick margarine. Lard. Shortening. Ghee. Bacon fat. Tropical oils, such as coconut, palm kernel, or palm oil. Seasoning and other foods Salted popcorn and pretzels. Onion salt, garlic salt, seasoned salt, table salt, and sea salt. Worcestershire sauce. Tartar sauce. Barbecue sauce. Teriyaki sauce. Soy sauce, including reduced-sodium. Steak sauce. Canned and packaged gravies. Fish sauce. Oyster sauce. Cocktail sauce. Horseradish that you find on the shelf. Ketchup. Mustard. Meat flavorings and tenderizers. Bouillon cubes. Hot sauce and Tabasco sauce. Premade or packaged marinades. Premade or packaged taco seasonings. Relishes. Regular salad dressings. Where to find more information:  National Heart, Lung, and [place]: https://wilson-eaton.com/  American Heart Association: www.heart.org Summary  The DASH eating plan is a healthy eating plan that has been shown to reduce high blood pressure (hypertension). It may also reduce your risk for type 2 diabetes, heart disease, and stroke.  With the DASH eating plan, you should limit salt (sodium) intake to 2,300 mg a day. If you have hypertension, you may need to reduce your sodium intake to 1,500 mg a day.  When on the DASH eating plan, aim to eat more fresh fruits and vegetables, whole grains, lean proteins, low-fat dairy, and heart-healthy fats.  Work with your health care provider or diet  and nutrition specialist (dietitian) to adjust your eating plan to your individual calorie needs. This information is not intended to replace advice given to you by your health care provider. Make sure you discuss any questions you have with your health care provider. Document Released: 04/11/2011 Document Revised: 04/15/2016 Document Reviewed: 04/15/2016 Elsevier Interactive Patient Education  [employer].

## 2018-04-13 NOTE — Assessment & Plan Note (Signed)
Check yeast and BV

## 2018-04-13 NOTE — Progress Notes (Signed)
Subjective:    Patient ID: Brittany Salinas, female    DOB: 10/14/1950, 67 y.o.   MRN: [id number]  No chief complaint on file.   HPI Patient is in today for annual preventative exam, gynecologic exam and follow up on chronic medical concerns including hypertension and hyperlipidemia. She is noting increased weight and decreased mood. She is tired and confirms anhedonia. She had one fall on her left side but no permanaent injury. She is noting some mild vaginal discharge but no itching or burning. She is noting some thickness on right labia but no pain. Manages her activities of daily living well. Not exercising regularly. Denies CP/palp/SOB/HA/congestion/fevers/GI or GU c/o. Taking meds as prescribed  Past Medical History:  Diagnosis Date  . Arthritis    OA  . Depression   . Hyperlipidemia   . Insomnia   . Memory loss   . Right foot pain 10/09/2017  . Vision abnormalities     Past Surgical History:  Procedure Laterality Date  . BREAST BIOPSY Right    benign  . BREAST REDUCTION SURGERY  2007  . CESAREAN SECTION     4 c-sections  . REDUCTION MAMMAPLASTY Bilateral   . ROTATOR CUFF REPAIR Bilateral    right in 2010, left in 2017.  . TONSILLECTOMY      Family History  Problem Relation Age of Onset  . Heart defect Mother        valvular heart disease  . Heart attack Father   . Hyperlipidemia Father   . Hypertension Father   . Breast cancer Maternal Aunt   . Marfan syndrome Sister   . Hypertension Brother   . Other Brother        vertigo  . Psoriasis Daughter   . Mental illness Son        depression alcohol in past  . Depression Son   . Cancer Maternal Grandmother        stomach  . Stroke Maternal Grandfather   . Marfan syndrome Maternal Grandfather        ?  Marland Kitchen Arrhythmia Son   . Alcohol abuse Son        h/o drug abuse    Social History   Socioeconomic History  . Marital status: Married    Spouse name: Not on file  . Number of children: Not on file  . Years  of education: Not on file  . Highest education level: Not on file  Occupational History  . Not on file  Social Needs  . Financial resource strain: Not on file  . Food insecurity:    Worry: Not on file    Inability: Not on file  . Transportation needs:    Medical: Not on file    Non-medical: Not on file  Tobacco Use  . Smoking status: Never Smoker  . Smokeless tobacco: Never Used  Substance and Sexual Activity  . Alcohol use: Yes    Alcohol/week: 0.0 standard drinks  . Drug use: No  . Sexual activity: Not on file  Lifestyle  . Physical activity:    Days per week: Not on file    Minutes per session: Not on file  . Stress: Not on file  Relationships  . Social connections:    Talks on phone: Not on file    Gets together: Not on file    Attends religious service: Not on file    Active member of club or organization: Not on file    Attends meetings of  clubs or organizations: Not on file    Relationship status: Not on file  . Intimate partner violence:    Fear of current or ex partner: Not on file    Emotionally abused: Not on file    Physically abused: Not on file    Forced sexual activity: Not on file  Other Topics Concern  . 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 Uses occasional light alcohol    Outpatient Medications Prior to Visit  Medication Sig Dispense Refill  . aspirin 81 MG tablet Take 81 mg by mouth daily.    Marland Kitchen buPROPion (WELLBUTRIN XL) 300 MG 24 hr tablet Take 1 tablet (300 mg total) by mouth daily. 90 tablet 1  . Melatonin 10 MG TABS Take 10 mg by mouth at bedtime.    . metoprolol succinate (TOPROL-XL) 50 MG 24 hr tablet Take 1 tablet (50 mg total) by mouth daily. 90 tablet 1  . Multiple Vitamin (MULTIVITAMIN) capsule Take 1 capsule by mouth daily.    Marland Kitchen NIACIN PO Take by mouth.    . Omega-3 Fatty Acids (FISH OIL PO) Take by mouth.    . rosuvastatin (CRESTOR) 10 MG  tablet Take 1 tablet (10 mg total) by mouth daily. 90 tablet 1  . ESTRING 2 MG vaginal ring      No facility-administered medications prior to visit.     Allergies  Allergen Reactions  . Codeine     Review of Systems  Constitutional: Positive for malaise/fatigue. Negative for chills and fever.  HENT: Negative for congestion and hearing loss.   Eyes: Negative for discharge.  Respiratory: Negative for cough, sputum production and shortness of breath.   Cardiovascular: Negative for chest pain, palpitations and leg swelling.  Gastrointestinal: Negative for abdominal pain, blood in stool, constipation, diarrhea, heartburn, nausea and vomiting.  Genitourinary: Negative for dysuria, frequency, hematuria and urgency.  Musculoskeletal: Negative for back pain, falls and myalgias.  Skin: Negative for rash.  Neurological: Negative for dizziness, sensory change, loss of consciousness, weakness and headaches.  Endo/Heme/Allergies: Negative for environmental allergies. Does not bruise/bleed easily.  Psychiatric/Behavioral: Positive for depression. Negative for suicidal ideas. The patient is not nervous/anxious and does not have insomnia.        Objective:    Physical Exam Constitutional:      General: She is not in acute distress.    Appearance: She is not diaphoretic.  HENT:     Head: Normocephalic and atraumatic.     Right Ear: External ear normal.     Left Ear: External ear normal.     Nose: Nose normal.     Mouth/Throat:     Mouth: Oropharynx is clear and moist.     Pharynx: No oropharyngeal exudate.  Eyes:     General: No scleral icterus.       Right eye: No discharge.        Left eye: No discharge.     Conjunctiva/sclera: Conjunctivae normal.     Pupils: Pupils are equal, round, and reactive to light.  Neck:     Musculoskeletal: Normal range of motion and neck supple.     Thyroid: No thyromegaly.  Cardiovascular:     Rate and Rhythm: Normal rate and regular rhythm.      Pulses: Intact distal pulses.     Heart sounds: Normal heart sounds. No murmur.  Pulmonary:     Effort: Pulmonary  effort is normal. No respiratory distress.     Breath sounds: Normal breath sounds. No wheezing or rales.  Abdominal:     General: Bowel sounds are normal. There is no distension.     Palpations: Abdomen is soft. There is no mass.     Tenderness: There is no abdominal tenderness.  Genitourinary:    Vagina: Vaginal discharge present.     Uterus: Normal.      Comments: Anterior right labia adherent and thickened. Musculoskeletal: Normal range of motion.        General: No tenderness or edema.  Lymphadenopathy:     Cervical: No cervical adenopathy.  Skin:    General: Skin is warm and dry.     Findings: No rash.  Neurological:     Mental Status: She is alert and oriented to person, place, and time.     Cranial Nerves: No cranial nerve deficit.     Coordination: Coordination normal.     Deep Tendon Reflexes: Reflexes are normal and symmetric. Reflexes normal.     BP (!) 142/90   Pulse 64   Temp 98.1 F (36.7 C) (Oral)   Resp 18   Ht 5\' 4"  (1.626 m)   Wt 181 lb 3.2 oz (82.2 kg)   SpO2 98%   BMI 31.10 kg/m  Wt Readings from Last 3 Encounters:  04/13/18 181 lb 3.2 oz (82.2 kg)  10/09/17 176 lb 6.4 oz (80 kg)  07/08/17 179 lb 9.6 oz (81.5 kg)     Lab Results  Component Value Date   WBC 7.2 04/13/2018   HGB 13.7 04/13/2018   HCT 41.1 04/13/2018   PLT 218.0 04/13/2018   GLUCOSE 89 04/13/2018   CHOL 167 04/13/2018   TRIG 222.0 (H) 04/13/2018   HDL 53.90 04/13/2018   LDLDIRECT 86.0 04/13/2018   LDLCALC 70 10/09/2017   ALT 15 04/13/2018   AST 17 04/13/2018   NA 140 04/13/2018   K 3.7 04/13/2018   CL 105 04/13/2018   CREATININE 0.73 04/13/2018   BUN 11 04/13/2018   CO2 27 04/13/2018   TSH 2.89 04/13/2018    Lab Results  Component Value Date   TSH 2.89 04/13/2018   Lab Results  Component Value Date   WBC 7.2 04/13/2018   HGB 13.7 04/13/2018    HCT 41.1 04/13/2018   MCV 89.9 04/13/2018   PLT 218.0 04/13/2018   Lab Results  Component Value Date   NA 140 04/13/2018   K 3.7 04/13/2018   CO2 27 04/13/2018   GLUCOSE 89 04/13/2018   BUN 11 04/13/2018   CREATININE 0.73 04/13/2018   BILITOT 0.6 04/13/2018   ALKPHOS 73 04/13/2018   AST 17 04/13/2018   ALT 15 04/13/2018   PROT 6.9 04/13/2018   ALBUMIN 4.4 04/13/2018   CALCIUM 9.1 04/13/2018   GFR 84.61 04/13/2018   Lab Results  Component Value Date   CHOL 167 04/13/2018   Lab Results  Component Value Date   HDL 53.90 04/13/2018   Lab Results  Component Value Date   LDLCALC 70 10/09/2017   Lab Results  Component Value Date   TRIG 222.0 (H) 04/13/2018   Lab Results  Component Value Date   CHOLHDL 3 04/13/2018   No results found for: HGBA1C     Assessment & Plan:   Problem List Items Addressed This Visit    Depression with anxiety    On Wellbutrin and struggling some but she thinks it is part of her weight gain  making her depressed and eat more.       Hypertension    Well controlled, no changes to meds. Encouraged heart healthy diet such as the DASH diet and exercise as tolerated. Improved  Recheck       Relevant Orders   TSH (Completed)   Comprehensive metabolic panel (Completed)   CBC (Completed)   Hyperlipidemia    Encouraged heart healthy diet, increase exercise, avoid trans fats, consider a krill oil cap daily      Relevant Orders   Lipid panel (Completed)   Colon polyp    Last colonoscopy at [facility] in 2013 in [place] was told to follow up in 3 years. Wants to transfer here. Will request old records again.       Relevant Orders   Ambulatory referral to Gastroenterology   Preventative health care    Patient encouraged to maintain heart healthy diet, regular exercise, adequate sleep. Consider daily probiotics. Take medications as prescribed. Flu shot given      Cervical cancer screening    Pap today, some thickening noted on right  labia.if patient willing will refer to gynecology for further consideration.       Relevant Orders   Cytology - PAP( [place]) (Completed)   Obesity    Encouraged DASH diet, decrease po intake and increase exercise as tolerated. Needs 7-8 hours of sleep nightly. Avoid trans fats, eat small, frequent meals every 4-5 hours with lean proteins, complex carbs and healthy fats. Minimize simple carbs, referred to Healthy weight and wellness      Vaginitis    Check yeast and BV       Other Visit Diagnoses    Breast cancer screening    -  Primary   Relevant Orders   MM 3D SCREEN BREAST BILATERAL (Completed)      I have changed Marionette Metheny's ESTRING. I am also having her maintain her aspirin, multivitamin, NIACIN PO, Omega-3 Fatty Acids (FISH OIL PO), Melatonin, buPROPion, metoprolol succinate, and rosuvastatin.  Meds ordered this encounter  Medications  . ESTRING 2 MG vaginal ring    Sig: Place 2 mg vaginally every 3 (three) months.    Dispense:  1 each    Refill:  3      Penni Homans, MD

## 2018-04-13 NOTE — Assessment & Plan Note (Signed)
Encouraged DASH diet, decrease po intake and increase exercise as tolerated. Needs 7-8 hours of sleep nightly. Avoid trans fats, eat small, frequent meals every 4-5 hours with lean proteins, complex carbs and healthy fats. Minimize simple carbs, referred to Healthy weight and wellness

## 2018-04-14 ENCOUNTER — Ambulatory Visit: Payer: PPO | Admitting: *Deleted

## 2018-04-16 LAB — CYTOLOGY - PAP
Adequacy: ABSENT
Candida vaginitis: NEGATIVE
Diagnosis: NEGATIVE
Neisseria Gonorrhea: NEGATIVE

## 2018-04-21 ENCOUNTER — Ambulatory Visit (INDEPENDENT_AMBULATORY_CARE_PROVIDER_SITE_OTHER): Payer: PPO | Admitting: Psychology

## 2018-04-21 DIAGNOSIS — F331 Major depressive disorder, recurrent, moderate: Secondary | ICD-10-CM | POA: Diagnosis not present

## 2018-04-27 ENCOUNTER — Other Ambulatory Visit: Payer: Self-pay | Admitting: Family Medicine

## 2018-04-27 DIAGNOSIS — H43812 Vitreous degeneration, left eye: Secondary | ICD-10-CM | POA: Diagnosis not present

## 2018-04-27 DIAGNOSIS — H4312 Vitreous hemorrhage, left eye: Secondary | ICD-10-CM | POA: Diagnosis not present

## 2018-05-01 DIAGNOSIS — H4312 Vitreous hemorrhage, left eye: Secondary | ICD-10-CM | POA: Diagnosis not present

## 2018-05-11 ENCOUNTER — Ambulatory Visit (INDEPENDENT_AMBULATORY_CARE_PROVIDER_SITE_OTHER): Payer: PPO | Admitting: Psychology

## 2018-05-11 DIAGNOSIS — F331 Major depressive disorder, recurrent, moderate: Secondary | ICD-10-CM

## 2018-05-18 DIAGNOSIS — H43812 Vitreous degeneration, left eye: Secondary | ICD-10-CM | POA: Diagnosis not present

## 2018-05-26 ENCOUNTER — Ambulatory Visit: Payer: PPO | Admitting: Psychology

## 2018-05-28 ENCOUNTER — Ambulatory Visit: Payer: PPO | Admitting: Psychology

## 2018-05-29 ENCOUNTER — Ambulatory Visit (INDEPENDENT_AMBULATORY_CARE_PROVIDER_SITE_OTHER): Payer: PPO | Admitting: Psychology

## 2018-05-29 DIAGNOSIS — F331 Major depressive disorder, recurrent, moderate: Secondary | ICD-10-CM

## 2018-06-09 DIAGNOSIS — H04123 Dry eye syndrome of bilateral lacrimal glands: Secondary | ICD-10-CM | POA: Diagnosis not present

## 2018-06-09 DIAGNOSIS — H2513 Age-related nuclear cataract, bilateral: Secondary | ICD-10-CM | POA: Diagnosis not present

## 2018-06-10 ENCOUNTER — Ambulatory Visit: Payer: PPO | Admitting: Psychology

## 2018-06-12 ENCOUNTER — Ambulatory Visit: Payer: PPO | Admitting: Psychology

## 2018-06-23 ENCOUNTER — Other Ambulatory Visit: Payer: Self-pay | Admitting: Family Medicine

## 2018-06-25 ENCOUNTER — Ambulatory Visit (INDEPENDENT_AMBULATORY_CARE_PROVIDER_SITE_OTHER): Payer: PPO | Admitting: Psychology

## 2018-06-25 DIAGNOSIS — F331 Major depressive disorder, recurrent, moderate: Secondary | ICD-10-CM

## 2018-07-01 ENCOUNTER — Encounter: Payer: Self-pay | Admitting: Family Medicine

## 2018-07-08 ENCOUNTER — Ambulatory Visit (INDEPENDENT_AMBULATORY_CARE_PROVIDER_SITE_OTHER): Payer: PPO | Admitting: Psychology

## 2018-07-08 DIAGNOSIS — F331 Major depressive disorder, recurrent, moderate: Secondary | ICD-10-CM

## 2018-07-22 ENCOUNTER — Ambulatory Visit: Payer: PPO | Admitting: Psychology

## 2018-08-06 ENCOUNTER — Other Ambulatory Visit: Payer: Self-pay | Admitting: Family Medicine

## 2018-08-06 ENCOUNTER — Ambulatory Visit: Payer: PPO | Admitting: Psychology

## 2018-11-12 ENCOUNTER — Other Ambulatory Visit: Payer: Self-pay

## 2018-11-16 ENCOUNTER — Ambulatory Visit (INDEPENDENT_AMBULATORY_CARE_PROVIDER_SITE_OTHER): Payer: PPO | Admitting: Family Medicine

## 2018-11-16 ENCOUNTER — Encounter: Payer: Self-pay | Admitting: Family Medicine

## 2018-11-16 ENCOUNTER — Other Ambulatory Visit (HOSPITAL_COMMUNITY)
Admission: RE | Admit: 2018-11-16 | Discharge: 2018-11-16 | Disposition: A | Discharge status: Home / Self Care | Payer: PPO | Source: Ambulatory Visit | Attending: Family Medicine | Admitting: Family Medicine

## 2018-11-16 ENCOUNTER — Other Ambulatory Visit: Payer: Self-pay

## 2018-11-16 VITALS — BP 140/84 | HR 58 | Temp 98.3°F | Resp 18 | Wt 178.4 lb

## 2018-11-16 DIAGNOSIS — F418 Other specified anxiety disorders: Secondary | ICD-10-CM

## 2018-11-16 DIAGNOSIS — Z124 Encounter for screening for malignant neoplasm of cervix: Secondary | ICD-10-CM | POA: Diagnosis not present

## 2018-11-16 DIAGNOSIS — N761 Subacute and chronic vaginitis: Secondary | ICD-10-CM

## 2018-11-16 DIAGNOSIS — N941 Unspecified dyspareunia: Secondary | ICD-10-CM | POA: Insufficient documentation

## 2018-11-16 DIAGNOSIS — I10 Essential (primary) hypertension: Secondary | ICD-10-CM

## 2018-11-16 HISTORY — DX: Unspecified dyspareunia: N94.10

## 2018-11-16 MED ORDER — METRONIDAZOLE 500 MG PO TABS: 500.0000 mg | ORAL_TABLET | Freq: Two times a day (BID) | ORAL | 0 refills | Status: DC

## 2018-11-16 MED ORDER — SERTRALINE HCL 50 MG PO TABS: ORAL_TABLET | ORAL | 3 refills | Status: DC

## 2018-11-16 NOTE — Assessment & Plan Note (Signed)
UTD

## 2018-11-16 NOTE — Progress Notes (Signed)
Subjective:    Patient ID: Brittany Salinas, female    DOB: [birth year], 68 y.o.   MRN: [id number]  No chief complaint on file.   HPI Patient is in today for follow-up on chronic concerns including recurrent vaginitis, depression, hypertension, hyperlipidemia and more.  She acknowledges she is having trouble with recurrent vaginitis noting a smelly yellowish discharge at times.  No belly or back pain although there has been a hint of dysuria this week off and on.  She reports her blood pressure systolic are 222L to 798X at home.  She notes she is generally not sexually active due to the disc.  Brittany Salinas.  She stopped her hormonal therapy says she did not feel they were helping and she reports no significant change. Denies CP/palp/SOB/HA/congestion/fevers/GI or GU c/o. Taking meds as prescribed. Her depression is much worse and she endorses anhedonia but not suicidal ideation. Denies CP/palp/SOB/HA/congestion/fevers/GI or GU c/o. Taking meds as prescribed  Past Medical History:  Diagnosis Date  . Arthritis    OA  . Depression   . Hyperlipidemia   . Insomnia   . Memory loss   . Right foot pain 10/09/2017  . Vision abnormalities     Past Surgical History:  Procedure Laterality Date  . BREAST BIOPSY Right    benign  . BREAST REDUCTION SURGERY  2007  . CESAREAN SECTION     4 c-sections  . REDUCTION MAMMAPLASTY Bilateral   . ROTATOR CUFF REPAIR Bilateral    right in 2010, left in 2017.  . TONSILLECTOMY      Family History  Problem Relation Age of Onset  . Heart defect Mother        valvular heart disease  . Heart attack Father   . Hyperlipidemia Father   . Hypertension Father   . Breast cancer Maternal Aunt   . Marfan syndrome Sister   . Hypertension Brother   . Other Brother        vertigo  . Psoriasis Daughter   . Mental illness Son        depression alcohol in past  . Depression Son   . Cancer Maternal Grandmother        stomach  . Stroke Maternal Grandfather   . Marfan  syndrome Maternal Grandfather        ?  Marland Kitchen Arrhythmia Son   . Alcohol abuse Son        h/o drug abuse    Social History   Socioeconomic History  . Marital status: Married    Spouse name: Not on file  . Number of children: Not on file  . Years of education: Not on file  . Highest education level: Not on file  Occupational History  . Not on file  Social Needs  . Financial resource strain: Not on file  . Food insecurity    Worry: Not on file    Inability: Not on file  . Transportation needs    Medical: Not on file    Non-medical: Not on file  Tobacco Use  . Smoking status: Never Smoker  . Smokeless tobacco: Never Used  Substance and Sexual Activity  . Alcohol use: Yes    Alcohol/week: 0.0 standard drinks  . Drug use: No  . Sexual activity: Not on file  Lifestyle  . Physical activity    Days per week: Not on file    Minutes per session: Not on file  . Stress: Not on file  Relationships  . Social connections  Talks on phone: Not on file    Gets together: Not on file    Attends religious service: Not on file    Active member of club or organization: Not on file    Attends meetings of clubs or organizations: Not on file    Relationship status: Not on file  . Intimate partner violence    Fear of current or ex partner: Not on file    Emotionally abused: Not on file    Physically abused: Not on file    Forced sexual activity: Not on file  Other Topics Concern  . 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 Uses occasional light alcohol    Outpatient Medications Prior to Visit  Medication Sig Dispense Refill  . aspirin 81 MG tablet Take 81 mg by mouth daily.    Marland Kitchen buPROPion (WELLBUTRIN XL) 300 MG 24 hr tablet TAKE 1 TABLET BY MOUTH EVERY DAY 90 tablet 1  . Melatonin 10 MG TABS Take 10 mg by mouth at bedtime.    . metoprolol succinate (TOPROL-XL) 50 MG 24 hr tablet TAKE 1 TABLET  BY MOUTH EVERY DAY 90 tablet 1  . Multiple Vitamin (MULTIVITAMIN) capsule Take 1 capsule by mouth daily.    Marland Kitchen NIACIN PO Take by mouth.    . Omega-3 Fatty Acids (FISH OIL PO) Take by mouth.    . rosuvastatin (CRESTOR) 10 MG tablet TAKE 1 TABLET BY MOUTH EVERY DAY 90 tablet 1  . ESTRING 2 MG vaginal ring Place 2 mg vaginally every 3 (three) months. 1 each 3   No facility-administered medications prior to visit.     Allergies  Allergen Reactions  . Codeine     Review of Systems  Constitutional: Positive for malaise/fatigue. Negative for fever.  HENT: Negative for congestion.   Eyes: Negative for blurred vision.  Respiratory: Negative for shortness of breath.   Cardiovascular: Negative for chest pain, palpitations and leg swelling.  Gastrointestinal: Negative for abdominal pain, blood in stool and nausea.  Genitourinary: Positive for dysuria. Negative for frequency, hematuria and urgency.  Musculoskeletal: Negative for falls.  Skin: Negative for rash.  Neurological: Negative for dizziness, loss of consciousness and headaches.  Endo/Heme/Allergies: Negative for environmental allergies.  Psychiatric/Behavioral: Positive for depression. The patient is nervous/anxious and has insomnia.        Objective:    Physical Exam Constitutional:      General: She is not in acute distress.    Appearance: She is well-developed.  HENT:     Head: Normocephalic and atraumatic.  Eyes:     Conjunctiva/sclera: Conjunctivae normal.  Neck:     Musculoskeletal: Neck supple.     Thyroid: No thyromegaly.  Cardiovascular:     Rate and Rhythm: Normal rate and regular rhythm.     Heart sounds: Normal heart sounds. No murmur.  Pulmonary:     Effort: Pulmonary effort is normal. No respiratory distress.     Breath sounds: Normal breath sounds.  Abdominal:     General: Bowel sounds are normal. There is no distension.     Palpations: Abdomen is soft. There is no mass.     Tenderness: There is no  abdominal tenderness.  Genitourinary:    General: Normal vulva.     Vagina: Vaginal discharge present.  Lymphadenopathy:     Cervical: No cervical adenopathy.  Skin:    General: Skin is warm and dry.  Neurological:     Mental Status: She is alert and oriented to person, place, and time.  Psychiatric:        Behavior: Behavior normal.     BP 140/84   Pulse (!) 58   Temp 98.3 F (36.8 C) (Oral)   Resp 18   Wt 178 lb 6.4 oz (80.9 kg)   SpO2 98%   BMI 30.62 kg/m  Wt Readings from Last 3 Encounters:  11/16/18 178 lb 6.4 oz (80.9 kg)  04/13/18 181 lb 3.2 oz (82.2 kg)  10/09/17 176 lb 6.4 oz (80 kg)    Diabetic Foot Exam - Simple   No data filed     Lab Results  Component Value Date   WBC 7.2 04/13/2018   HGB 13.7 04/13/2018   HCT 41.1 04/13/2018   PLT 218.0 04/13/2018   GLUCOSE 89 04/13/2018   CHOL 167 04/13/2018   TRIG 222.0 (H) 04/13/2018   HDL 53.90 04/13/2018   LDLDIRECT 86.0 04/13/2018   LDLCALC 70 10/09/2017   ALT 15 04/13/2018   AST 17 04/13/2018   NA 140 04/13/2018   K 3.7 04/13/2018   CL 105 04/13/2018   CREATININE 0.73 04/13/2018   BUN 11 04/13/2018   CO2 27 04/13/2018   TSH 2.89 04/13/2018    Lab Results  Component Value Date   TSH 2.89 04/13/2018   Lab Results  Component Value Date   WBC 7.2 04/13/2018   HGB 13.7 04/13/2018   HCT 41.1 04/13/2018   MCV 89.9 04/13/2018   PLT 218.0 04/13/2018   Lab Results  Component Value Date   NA 140 04/13/2018   K 3.7 04/13/2018   CO2 27 04/13/2018   GLUCOSE 89 04/13/2018   BUN 11 04/13/2018   CREATININE 0.73 04/13/2018   BILITOT 0.6 04/13/2018   ALKPHOS 73 04/13/2018   AST 17 04/13/2018   ALT 15 04/13/2018   PROT 6.9 04/13/2018   ALBUMIN 4.4 04/13/2018   CALCIUM 9.1 04/13/2018   GFR 84.61 04/13/2018   Lab Results  Component Value Date   CHOL 167 04/13/2018   Lab Results  Component Value Date   HDL 53.90 04/13/2018   Lab Results  Component Value Date   LDLCALC 70 10/09/2017   Lab  Results  Component Value Date   TRIG 222.0 (H) 04/13/2018   Lab Results  Component Value Date   CHOLHDL 3 04/13/2018   No results found for: HGBA1C     Assessment & Plan:   Problem List Items Addressed This Visit    Depression with anxiety    Worsening during the pandemic 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 She will continue Wellbutrin and we will add a low dose of Sertraline 25 mg daily x 7 days then increase to 50 mg daily report if any concerns      Relevant Medications   sertraline (ZOLOFT) 50 MG tablet   Hypertension    Well controlled, no changes to meds. Encouraged heart healthy diet such as the DASH diet and exercise as tolerated. She reports systolic BP in the 226J and 130s at home.       Cervical cancer screening    UTD      Vaginitis - Primary    greyish yellow discharge likely BV will treat with Flagyl and send culture for testing. Minimize simple carbs and use cotton undergarments.       Relevant Orders   Cervicovaginal ancillary only(  [place])   Dyspareunia in female    She felt the Estring was helping for awhile but when it stopped helping she just quit using and her symptoms are still present but tolerable. Let us know if wants to take hormonal therapies again or would like a referral to GYN for further evaluation.          I have discontinued [employer]. I am also having her start on sertraline and metroNIDAZOLE. Additionally, I am having her maintain her aspirin, multivitamin, NIACIN PO, Omega-3 Fatty Acids (FISH OIL PO), Melatonin, buPROPion, rosuvastatin, and metoprolol succinate.  Meds ordered this encounter  Medications  . sertraline (ZOLOFT) 50 MG tablet    Sig: 1/2 tab po dail y x 7 days then 1 tab po daily    Dispense:  30 tablet    Refill:  3  . metroNIDAZOLE (FLAGYL) 500 MG tablet    Sig: Take 1 tablet (500 mg total) by mouth 2 (two) times daily.    Dispense:  14 tablet     Refill:  0     Penni Homans, MD

## 2018-11-16 NOTE — Patient Instructions (Signed)
Vaginitis  Vaginitis is irritation and swelling (inflammation) of the vagina. It happens when normal bacteria and yeast in the vagina grow too much. There are many types of this condition. Treatment will depend on the type you have. Follow these instructions at home: Lifestyle  Keep your vagina area clean and dry. ? Avoid using soap. ? Rinse the area with water.  Do not do the following until your doctor says it is okay: ? Wash and clean out the vagina (douche). ? Use tampons. ? Have sex.  Wipe from front to back after going to the bathroom.  Let air reach your vagina. ? Wear cotton underwear. ? Do not wear: ? Underwear while you sleep. ? Tight pants. ? Thong underwear. ? Underwear or nylons without a cotton panel. ? Take off any wet clothing, such as bathing suits, as soon as possible.  Use gentle, non-scented products. Do not use things that can irritate the vagina, such as fabric softeners. Avoid the following products if they are scented: ? Feminine sprays. ? Detergents. ? Tampons. ? Feminine hygiene products. ? Soaps or bubble baths.  Practice safe sex and use condoms. General instructions  Take over-the-counter and prescription medicines only as told by your doctor.  If you were prescribed an antibiotic medicine, take or use it as told by your doctor. Do not stop taking or using the antibiotic even if you start to feel better.  Keep all follow-up visits as told by your doctor. This is important. Contact a doctor if:  You have pain in your belly.  You have a fever.  Your symptoms last for more than 2-3 days. Get help right away if:  You have a fever and your symptoms get worse all of a sudden. Summary  Vaginitis is irritation and swelling of the vagina. It can happen when the normal bacteria and yeast in the vagina grow too much. There are many types.  Treatment will depend on the type you have.  Do not douche, use tampons , or have sex until your health  care provider approves. When you can return to sex, practice safe sex and use condoms. This information is not intended to replace advice given to you by your health care provider. Make sure you discuss any questions you have with your health care provider. Document Released: 07/19/2008 Document Revised: 04/04/2017 Document Reviewed: 05/14/2016 Elsevier Patient Education  2020 [employer].

## 2018-11-16 NOTE — Assessment & Plan Note (Signed)
She felt the Estring was helping for awhile but when it stopped helping she just quit using and her symptoms are still present but tolerable. Let us know if wants to take hormonal therapies again or would like a referral to GYN for further evaluation.

## 2018-11-16 NOTE — Assessment & Plan Note (Signed)
greyish yellow discharge likely BV will treat with Flagyl and send culture for testing. Minimize simple carbs and use cotton undergarments.

## 2018-11-16 NOTE — Assessment & Plan Note (Signed)
Well controlled, no changes to meds. Encouraged heart healthy diet such as the DASH diet and exercise as tolerated. She reports systolic BP in the 030D and 130s at home.

## 2018-11-16 NOTE — Assessment & Plan Note (Signed)
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 She will continue Wellbutrin and we will add a low dose of Sertraline 25 mg daily x 7 days then increase to 50 mg daily report if any concerns

## 2018-11-18 LAB — CERVICOVAGINAL ANCILLARY ONLY
Bacterial vaginitis: POSITIVE — AB
Candida vaginitis: NEGATIVE

## 2018-11-26 ENCOUNTER — Telehealth: Payer: PPO | Admitting: Family

## 2018-11-26 DIAGNOSIS — M545 Low back pain, unspecified: Secondary | ICD-10-CM

## 2018-11-26 MED ORDER — BACLOFEN 10 MG PO TABS: 10.0000 mg | ORAL_TABLET | Freq: Three times a day (TID) | ORAL | 0 refills | Status: DC | PRN

## 2018-11-26 NOTE — Progress Notes (Signed)
Greater than 5 minutes, yet less than 10 minutes of time have been spent researching, coordinating, and implementing care for this patient today.  Thank you for the details you included in the comment boxes. Those details are very helpful in determining the best course of treatment for you and help [place] to provide the best care.  We are sorry that you are not feeling well.  Here is how we plan to help!  Based on what you have shared with me it looks like you mostly have acute back pain.  Acute back pain is defined as musculoskeletal pain that can resolve in 1-3 weeks with conservative treatment.  I have prescribed Baclofen 10 mg every eight hours as needed which is a muscle relaxer  Some patients experience stomach irritation or in increased heartburn with anti-inflammatory drugs.  Please keep in mind that muscle relaxer's can cause fatigue and should not be taken while at work or driving.  Back pain is very common.  The pain often gets better over time.  The cause of back pain is usually not dangerous.  Most people can learn to manage their back pain on their own.  Home Care  Stay active.  Start with short walks on flat ground if you can.  Try to walk farther each day.  Do not sit, drive or stand in one place for more than 30 minutes.  Do not stay in bed.  Do not avoid exercise or work.  Activity can help your back heal faster.  Be careful when you bend or lift an object.  Bend at your knees, keep the object close to you, and do not twist.  Sleep on a firm mattress.  Lie on your side, and bend your knees.  If you lie on your back, put a pillow under your knees.  Only take medicines as told by your doctor.  Put ice on the injured area.  Put ice in a plastic bag  Place a towel between your skin and the bag  Leave the ice on for 15-20 minutes, 3-4 times a day for the first 2-3 days. 210 After that, you can switch between ice and heat packs.  Ask your doctor about back exercises or  massage.  Avoid feeling anxious or stressed.  Find good ways to deal with stress, such as exercise.  Get Help Right Way If:  Your pain does not go away with rest or medicine.  Your pain does not go away in 1 week.  You have new problems.  You do not feel well.  The pain spreads into your legs.  You cannot control when you poop (bowel movement) or pee (urinate)  You feel sick to your stomach (nauseous) or throw up (vomit)  You have belly (abdominal) pain.  You feel like you may pass out (faint).  If you develop a fever.  Make Sure you:  Understand these instructions.  Will watch your condition  Will get help right away if you are not doing well or get worse.  Your e-visit answers were reviewed by a board certified advanced clinical practitioner to complete your personal care plan.  Depending on the condition, your plan could have included both over the counter or prescription medications.  If there is a problem please reply  once you have received a response from your provider.  Your safety is important to [place].  If you have drug allergies check your prescription carefully.    You can use MyChart to ask questions about  today's visit, request a non-urgent call back, or ask for a work or school excuse for 24 hours related to this e-Visit. If it has been greater than 24 hours you will need to follow up with your provider, or enter a new e-Visit to address those concerns.  You will get an e-mail in the next two days asking about your experience.  I hope that your e-visit has been valuable and will speed your recovery. Thank you for using e-visits.

## 2018-12-08 ENCOUNTER — Other Ambulatory Visit: Payer: Self-pay | Admitting: Family Medicine

## 2018-12-15 ENCOUNTER — Telehealth: Payer: PPO | Admitting: Family

## 2018-12-15 DIAGNOSIS — M5440 Lumbago with sciatica, unspecified side: Secondary | ICD-10-CM

## 2018-12-15 NOTE — Progress Notes (Signed)
Based on what you shared with me, I feel your condition warrants further evaluation and I recommend that you be seen for a face to face office visit.  Because your back pain continues, we need to see you.   NOTE: If you entered your credit card information for this eVisit, you will not be charged. You may see a "hold" on your card for the $35 but that hold will drop off and you will not have a charge processed.  If you are having a true medical emergency please call 911.     For an urgent face to face visit, [place] has five urgent care centers for your convenience:    [email or web address] to reserve your spot online an avoid wait times  [facility] (New Address!) [address] [place], [place] [address] *Just off [address], across the road from [place] hours of operation: Monday-Friday, 12 PM to 6 PM  Closed Saturday & Sunday   The following sites will take your insurance:  . [facility]    [phone]                  Get Driving Directions  [address], [place] [address] . 10 am to 8 pm Monday-Friday . 12 pm to 8 pm Saturday-Sunday   . [facility] at [place]                  Get Driving Directions  [address], [place], [place] [address] . 8 am to 8 pm Monday-Friday . 9 am to 6 pm Saturday . 11 am to 6 pm Sunday   . [facility] at [place]                  Get Driving Directions   [address], [place] [address] . 8 am to 8 pm Monday-Friday . 8 am to 4 pm Saturday-Sunday    . [facility] at [place]                    Get Driving Directions  [phone]  [address] [place], [place] [address]  . Monday-Friday, 12 PM to 6 PM    Your e-visit answers were reviewed by a board certified advanced clinical practitioner to complete  your personal care plan.  Thank you for using e-Visits.

## 2018-12-18 ENCOUNTER — Other Ambulatory Visit: Payer: Self-pay | Admitting: Family Medicine

## 2019-01-08 ENCOUNTER — Other Ambulatory Visit: Payer: Self-pay | Admitting: Family Medicine

## 2019-01-26 ENCOUNTER — Ambulatory Visit: Payer: PPO | Admitting: Family Medicine

## 2019-01-26 ENCOUNTER — Other Ambulatory Visit: Payer: Self-pay

## 2019-01-26 ENCOUNTER — Ambulatory Visit (INDEPENDENT_AMBULATORY_CARE_PROVIDER_SITE_OTHER): Payer: PPO | Admitting: Family Medicine

## 2019-01-26 DIAGNOSIS — E785 Hyperlipidemia, unspecified: Secondary | ICD-10-CM

## 2019-01-26 DIAGNOSIS — Z23 Encounter for immunization: Secondary | ICD-10-CM

## 2019-01-26 DIAGNOSIS — L0291 Cutaneous abscess, unspecified: Secondary | ICD-10-CM

## 2019-01-26 DIAGNOSIS — I10 Essential (primary) hypertension: Secondary | ICD-10-CM

## 2019-01-26 DIAGNOSIS — F418 Other specified anxiety disorders: Secondary | ICD-10-CM | POA: Diagnosis not present

## 2019-01-26 DIAGNOSIS — R42 Dizziness and giddiness: Secondary | ICD-10-CM | POA: Diagnosis not present

## 2019-01-26 LAB — COMPREHENSIVE METABOLIC PANEL
ALT: 21 U/L (ref 0–35)
AST: 22 U/L (ref 0–37)
Albumin: 4.2 g/dL (ref 3.5–5.2)
Alkaline Phosphatase: 64 U/L (ref 39–117)
BUN: 13 mg/dL (ref 6–23)
CO2: 27 mEq/L (ref 19–32)
Calcium: 9.4 mg/dL (ref 8.4–10.5)
Chloride: 106 mEq/L (ref 96–112)
Creatinine, Ser: 0.73 mg/dL (ref 0.40–1.20)
GFR: 79.42 mL/min (ref >60.00)
Glucose, Bld: 84 mg/dL (ref 70–99)
Potassium: 4.4 mEq/L (ref 3.5–5.1)
Sodium: 139 mEq/L (ref 135–145)
Total Bilirubin: 0.6 mg/dL (ref 0.2–1.2)
Total Protein: 6.7 g/dL (ref 6.0–8.3)

## 2019-01-26 LAB — SEDIMENTATION RATE: Sed Rate: 10 mm/hr (ref 0–30)

## 2019-01-26 LAB — CBC WITH DIFFERENTIAL/PLATELET
Basophils Absolute: 0 10*3/uL (ref 0.0–0.1)
Basophils Relative: 0.7 % (ref 0.0–3.0)
Eosinophils Absolute: 0.2 10*3/uL (ref 0.0–0.7)
Eosinophils Relative: 2.4 % (ref 0.0–5.0)
HCT: 39.3 % (ref 36.0–46.0)
Hemoglobin: 13.2 g/dL (ref 12.0–15.0)
Lymphocytes Relative: 31.2 % (ref 12.0–46.0)
Lymphs Abs: 2.2 10*3/uL (ref 0.7–4.0)
MCHC: 33.5 g/dL (ref 30.0–36.0)
MCV: 90.2 fl (ref 78.0–100.0)
Monocytes Absolute: 0.5 10*3/uL (ref 0.1–1.0)
Monocytes Relative: 6.7 % (ref 3.0–12.0)
Neutro Abs: 4.2 10*3/uL (ref 1.4–7.7)
Neutrophils Relative %: 59 % (ref 43.0–77.0)
Platelets: 225 10*3/uL (ref 150.0–400.0)
RBC: 4.35 Mil/uL (ref 3.87–5.11)
RDW: 12.9 % (ref 11.5–15.5)
WBC: 7.1 10*3/uL (ref 4.0–10.5)

## 2019-01-26 LAB — LIPID PANEL
Cholesterol: 170 mg/dL (ref 0–200)
HDL: 52.4 mg/dL (ref >39.00)
LDL Cholesterol: 83 mg/dL (ref 0–99)
NonHDL: 117.5
Total CHOL/HDL Ratio: 3
Triglycerides: 171 mg/dL — ABNORMAL HIGH (ref 0.0–149.0)
VLDL: 34.2 mg/dL (ref 0.0–40.0)

## 2019-01-26 LAB — TSH: TSH: 2.91 u[IU]/mL (ref 0.35–4.50)

## 2019-01-26 MED ORDER — MECLIZINE HCL 12.5 MG PO TABS: 12.5000 mg | ORAL_TABLET | Freq: Three times a day (TID) | ORAL | 0 refills | Status: DC | PRN

## 2019-01-26 NOTE — Assessment & Plan Note (Signed)
Well controlled, no changes to meds. Encouraged heart healthy diet such as the DASH diet and exercise as tolerated.  °

## 2019-01-26 NOTE — Progress Notes (Signed)
Subjective:    Patient ID: Mariame Durrette, female    DOB: 1951/01/12, 68 y.o.   MRN: [id number]  No chief complaint on file.   HPI Patient is in today for evaluation of chronic medical concerns including hyperlipidemia, hypertension, depression and anxiety. She feels well she has been pleased with her response to the addition of Sertraline. She notes an improvement in anxiety and anhedonia. She does still have intermittent issues with disequilibrium that has led to some nausea and debility. No falls or injury. Denies CP/palp/SOB/HA/congestion/fevers or GU c/o. Taking meds as prescribed  Past Medical History:  Diagnosis Date  . Arthritis    OA  . Depression   . Hyperlipidemia   . Insomnia   . Memory loss   . Right foot pain 10/09/2017  . Vision abnormalities     Past Surgical History:  Procedure Laterality Date  . BREAST BIOPSY Right    benign  . BREAST REDUCTION SURGERY  2007  . CESAREAN SECTION     4 c-sections  . REDUCTION MAMMAPLASTY Bilateral   . ROTATOR CUFF REPAIR Bilateral    right in 2010, left in 2017.  . TONSILLECTOMY      Family History  Problem Relation Age of Onset  . Heart defect Mother        valvular heart disease  . Heart attack Father   . Hyperlipidemia Father   . Hypertension Father   . Breast cancer Maternal Aunt   . Marfan syndrome Sister   . Hypertension Brother   . Other Brother        vertigo  . Psoriasis Daughter   . Mental illness Son        depression alcohol in past  . Depression Son   . Cancer Maternal Grandmother        stomach  . Stroke Maternal Grandfather   . Marfan syndrome Maternal Grandfather        ?  Marland Kitchen Arrhythmia Son   . Alcohol abuse Son        h/o drug abuse    Social History   Socioeconomic History  . Marital status: Married    Spouse name: Not on file  . Number of children: Not on file  . Years of education: Not on file  . Highest education level: Not on file  Occupational History  . Not on file  Social  Needs  . Financial resource strain: Not on file  . Food insecurity    Worry: Not on file    Inability: Not on file  . Transportation needs    Medical: Not on file    Non-medical: Not on file  Tobacco Use  . Smoking status: Never Smoker  . Smokeless tobacco: Never Used  Substance and Sexual Activity  . Alcohol use: Yes    Alcohol/week: 0.0 standard drinks  . Drug use: No  . Sexual activity: Not on file  Lifestyle  . Physical activity    Days per week: Not on file    Minutes per session: Not on file  . Stress: Not on file  Relationships  . Social Herbalist on phone: Not on file    Gets together: Not on file    Attends religious service: Not on file    Active member of club or organization: Not on file    Attends meetings of clubs or organizations: Not on file    Relationship status: Not on file  . Intimate partner violence  Fear of current or ex partner: Not on file    Emotionally abused: Not on file    Physically abused: Not on file    Forced sexual activity: Not on file  Other Topics Concern  . 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 Uses occasional light alcohol    Outpatient Medications Prior to Visit  Medication Sig Dispense Refill  . aspirin 81 MG tablet Take 81 mg by mouth daily.    . baclofen (LIORESAL) 10 MG tablet Take 1 tablet (10 mg total) by mouth every 8 (eight) hours as needed for muscle spasms. (Patient not taking: Reported on 01/27/2019) 30 each 0  . buPROPion (WELLBUTRIN XL) 300 MG 24 hr tablet TAKE 1 TABLET BY MOUTH EVERY DAY 90 tablet 1  . Melatonin 10 MG TABS Take 10 mg by mouth at bedtime.    . metoprolol succinate (TOPROL-XL) 50 MG 24 hr tablet TAKE 1 TABLET BY MOUTH EVERY DAY 90 tablet 1  . Multiple Vitamin (MULTIVITAMIN) capsule Take 1 capsule by mouth daily.    Marland Kitchen NIACIN PO Take by mouth.    . Omega-3 Fatty Acids (FISH OIL PO) Take by mouth.     . rosuvastatin (CRESTOR) 10 MG tablet TAKE 1 TABLET BY MOUTH EVERY DAY 90 tablet 1  . sertraline (ZOLOFT) 50 MG tablet TAKE 1/2 TABLET BY MOUTH EVERY DAY FOR 7 DAYS, THEN 1 TAB DAILY 90 tablet 2  . metroNIDAZOLE (FLAGYL) 500 MG tablet Take 1 tablet (500 mg total) by mouth 2 (two) times daily. (Patient not taking: Reported on 01/27/2019) 14 tablet 0   No facility-administered medications prior to visit.     Allergies  Allergen Reactions  . Codeine     Review of Systems  Constitutional: Negative for fever and malaise/fatigue.  HENT: Negative for congestion.   Eyes: Negative for blurred vision.  Respiratory: Negative for shortness of breath.   Cardiovascular: Negative for chest pain, palpitations and leg swelling.  Gastrointestinal: Positive for nausea. Negative for abdominal pain, blood in stool and vomiting.  Genitourinary: Negative for dysuria and frequency.  Musculoskeletal: Negative for falls.  Skin: Negative for rash.  Neurological: Positive for dizziness. Negative for tingling, loss of consciousness and headaches.  Endo/Heme/Allergies: Negative for environmental allergies.  Psychiatric/Behavioral: Negative for depression. The patient is not nervous/anxious.        Objective:    Physical Exam Vitals signs and nursing note reviewed.  Constitutional:      General: She is not in acute distress.    Appearance: She is well-developed.  HENT:     Head: Normocephalic and atraumatic.     Nose: Nose normal.  Eyes:     General:        Right eye: No discharge.        Left eye: No discharge.     Pupils: Pupils are equal, round, and reactive to light.     Comments: 1 beat of nystagmus noted with left lateral gaze  Neck:     Musculoskeletal: Normal range of motion and neck supple.  Cardiovascular:     Rate and Rhythm: Normal rate and regular rhythm.     Heart sounds: No murmur.  Pulmonary:     Effort: Pulmonary effort is normal.     Breath sounds: Normal breath sounds.   Abdominal:     General: Bowel sounds are normal.     Palpations: Abdomen is  soft.     Tenderness: There is no abdominal tenderness.  Skin:    General: Skin is warm and dry.  Neurological:     Mental Status: She is alert and oriented to person, place, and time.     There were no vitals taken for this visit. Wt Readings from Last 3 Encounters:  11/16/18 178 lb 6.4 oz (80.9 kg)  04/13/18 181 lb 3.2 oz (82.2 kg)  10/09/17 176 lb 6.4 oz (80 kg)    Diabetic Foot Exam - Simple   No data filed     Lab Results  Component Value Date   WBC 7.1 01/26/2019   HGB 13.2 01/26/2019   HCT 39.3 01/26/2019   PLT 225.0 01/26/2019   GLUCOSE 84 01/26/2019   CHOL 170 01/26/2019   TRIG 171.0 (H) 01/26/2019   HDL 52.40 01/26/2019   LDLDIRECT 86.0 04/13/2018   LDLCALC 83 01/26/2019   ALT 21 01/26/2019   AST 22 01/26/2019   NA 139 01/26/2019   K 4.4 01/26/2019   CL 106 01/26/2019   CREATININE 0.73 01/26/2019   BUN 13 01/26/2019   CO2 27 01/26/2019   TSH 2.91 01/26/2019    Lab Results  Component Value Date   TSH 2.91 01/26/2019   Lab Results  Component Value Date   WBC 7.1 01/26/2019   HGB 13.2 01/26/2019   HCT 39.3 01/26/2019   MCV 90.2 01/26/2019   PLT 225.0 01/26/2019   Lab Results  Component Value Date   NA 139 01/26/2019   K 4.4 01/26/2019   CO2 27 01/26/2019   GLUCOSE 84 01/26/2019   BUN 13 01/26/2019   CREATININE 0.73 01/26/2019   BILITOT 0.6 01/26/2019   ALKPHOS 64 01/26/2019   AST 22 01/26/2019   ALT 21 01/26/2019   PROT 6.7 01/26/2019   ALBUMIN 4.2 01/26/2019   CALCIUM 9.4 01/26/2019   GFR 79.42 01/26/2019   Lab Results  Component Value Date   CHOL 170 01/26/2019   Lab Results  Component Value Date   HDL 52.40 01/26/2019   Lab Results  Component Value Date   LDLCALC 83 01/26/2019   Lab Results  Component Value Date   TRIG 171.0 (H) 01/26/2019   Lab Results  Component Value Date   CHOLHDL 3 01/26/2019   No results found for: HGBA1C      Assessment & Plan:   Problem List Items Addressed This Visit    Depression with anxiety    Doing well on the combination of Wellbutrin and Sertraline. No changes today.      Hypertension    Well controlled, no changes to meds. Encouraged heart healthy diet such as the DASH diet and exercise as tolerated.       Relevant Orders   CBC with Differential/Platelet (Completed)   TSH (Completed)   Hyperlipidemia    Encouraged heart healthy diet, increase exercise, avoid trans fats, consider a krill oil cap daily      Relevant Orders   Comprehensive metabolic panel (Completed)   Lipid panel (Completed)   RESOLVED: Abscess   Vertigo    Started on Meclizine prn and encouraged to hydrate and alter activity to move slower. Referred to PT for disequilibrium        Other Visit Diagnoses    Needs flu shot    -  Primary   Relevant Orders   Flu Vaccine QUAD High Dose(Fluad) (Completed)   Dizziness       Relevant Orders   CBC with Differential/Platelet (  Completed)   Sedimentation rate (Completed)   Ambulatory referral to Physical Therapy      I have discontinued Kortnee Sentell's metroNIDAZOLE. I am also having her start on meclizine. Additionally, I am having her maintain her aspirin, multivitamin, NIACIN PO, Omega-3 Fatty Acids (FISH OIL PO), Melatonin, metoprolol succinate, baclofen, rosuvastatin, buPROPion, and sertraline.  Meds ordered this encounter  Medications  . meclizine (ANTIVERT) 12.5 MG tablet    Sig: Take 1 tablet (12.5 mg total) by mouth 3 (three) times daily as needed for dizziness.    Dispense:  30 tablet    Refill:  0     Penni Homans, MD

## 2019-01-26 NOTE — Patient Instructions (Signed)
Dizziness Dizziness is a common problem. It is a feeling of unsteadiness or light-headedness. You may feel like you are about to faint. Dizziness can lead to injury if you stumble or fall. Anyone can become dizzy, but dizziness is more common in older adults. This condition can be caused by a number of things, including medicines, dehydration, or illness. Follow these instructions at home: Eating and drinking  Drink enough fluid to keep your urine clear or pale yellow. This helps to keep you from becoming dehydrated. Try to drink more clear fluids, such as water.  Do not drink alcohol.  Limit your caffeine intake if told to do so by your health care provider. Check ingredients and nutrition facts to see if a food or beverage contains caffeine.  Limit your salt (sodium) intake if told to do so by your health care provider. Check ingredients and nutrition facts to see if a food or beverage contains sodium. Activity  Avoid making quick movements. ? Rise slowly from chairs and steady yourself until you feel okay. ? In the morning, first sit up on the side of the bed. When you feel okay, stand slowly while you hold onto something until you know that your balance is fine.  If you need to stand in one place for a long time, move your legs often. Tighten and relax the muscles in your legs while you are standing.  Do not drive or use heavy machinery if you feel dizzy.  Avoid bending down if you feel dizzy. Place items in your home so that they are easy for you to reach without leaning over. Lifestyle  Do not use any products that contain nicotine or tobacco, such as cigarettes and e-cigarettes. If you need help quitting, ask your health care provider.  Try to reduce your stress level by using methods such as yoga or meditation. Talk with your health care provider if you need help to manage your stress. General instructions  Watch your dizziness for any changes.  Take over-the-counter and  prescription medicines only as told by your health care provider. Talk with your health care provider if you think that your dizziness is caused by a medicine that you are taking.  Tell a friend or a family member that you are feeling dizzy. If he or she notices any changes in your behavior, have this person call your health care provider.  Keep all follow-up visits as told by your health care provider. This is important. Contact a health care provider if:  Your dizziness does not go away.  Your dizziness or light-headedness gets worse.  You feel nauseous.  You have reduced hearing.  You have new symptoms.  You are unsteady on your feet or you feel like the room is spinning. Get help right away if:  You vomit or have diarrhea and are unable to eat or drink anything.  You have problems talking, walking, swallowing, or using your arms, hands, or legs.  You feel generally weak.  You are not thinking clearly or you have trouble forming sentences. It may take a friend or family member to notice this.  You have chest pain, abdominal pain, shortness of breath, or sweating.  Your vision changes.  You have any bleeding.  You have a severe headache.  You have neck pain or a stiff neck.  You have a fever. These symptoms may represent a serious problem that is an emergency. Do not wait to see if the symptoms will go away. Get medical help   right away. Call your local emergency services (911 in the [place] Do not drive yourself to the hospital. Summary  Dizziness is a feeling of unsteadiness or light-headedness. This condition can be caused by a number of things, including medicines, dehydration, or illness.  Anyone can become dizzy, but dizziness is more common in older adults.  Drink enough fluid to keep your urine clear or pale yellow. Do not drink alcohol.  Avoid making quick movements if you feel dizzy. Monitor your dizziness for any changes. This information is not intended to  replace advice given to you by your health care provider. Make sure you discuss any questions you have with your health care provider. Document Released: 10/16/2000 Document Revised: 04/25/2017 Document Reviewed: 05/25/2016 Elsevier Patient Education  2020 [employer]  

## 2019-01-26 NOTE — Assessment & Plan Note (Deleted)
Has ruptured on her back. Did enlarge after last visit and has now shrunk again after opening and draining copious amounts of pus. No systemic symptoms but notable pain which is now improving. She awaits surgery consult to have lesion surgically removed. Started on Bactrim DS bid and continue antibiotics. Open to air at home and cover when out. Clean with mild soap and water and H2O2

## 2019-01-26 NOTE — Assessment & Plan Note (Signed)
Encouraged heart healthy diet, increase exercise, avoid trans fats, consider a krill oil cap daily 

## 2019-01-27 ENCOUNTER — Ambulatory Visit: Payer: PPO | Attending: Family Medicine | Admitting: Physical Therapy

## 2019-01-27 ENCOUNTER — Encounter: Payer: Self-pay | Admitting: Physical Therapy

## 2019-01-27 DIAGNOSIS — R2681 Unsteadiness on feet: Secondary | ICD-10-CM | POA: Insufficient documentation

## 2019-01-27 DIAGNOSIS — R42 Dizziness and giddiness: Secondary | ICD-10-CM

## 2019-01-27 NOTE — Therapy (Signed)
[facility] [address]  [place], [place], [address] Phone: [phone]   Fax:  [phone]  Physical Therapy Evaluation  Patient Details  Name: Brittany Salinas MRN: [id number] Date of Birth: 1950-12-04 Referring Provider (PT): Penni Homans, MD   Encounter Date: 01/27/2019  PT End of Session - 01/27/19 1529    Visit Number  1    Number of Visits  13    Date for PT Re-Evaluation  03/10/19    Authorization Type  [employer]    PT Start Time  1447    PT Stop Time  1524    PT Time Calculation (min)  37 min    Activity Tolerance  Other (comment)   limited by dizziness   Behavior During Therapy  [facility] for tasks assessed/performed       Past Medical History:  Diagnosis Date  . Arthritis    OA  . Depression   . Hyperlipidemia   . Insomnia   . Memory loss   . Right foot pain 10/09/2017  . Vision abnormalities     Past Surgical History:  Procedure Laterality Date  . BREAST BIOPSY Right    benign  . BREAST REDUCTION SURGERY  2007  . CESAREAN SECTION     4 c-sections  . REDUCTION MAMMAPLASTY Bilateral   . ROTATOR CUFF REPAIR Bilateral    right in 2010, left in 2017.  . TONSILLECTOMY      There were no vitals filed for this visit.   Subjective Assessment - 01/27/19 1448    Subjective  Patient reporting dizziness since September 14th. Was leaning over and standing up repeatedly when working in her garden. Dizziness lasted until she went to bed that day. Felt normal when she woke up, until she sat up in bed. Denies spinning but feels like she is not sure where she is in space. Sensation is becoming less frequent. Feels like this comes on spontaneously and without a position change. Sudden changes in head movement would sometimes bring this on. Has been getting nauseous. Denies recent illness, infection, fall. Has taken meclizine once with slight improvement. Denies tinnitus, hearing loss, otalgia. Denies dizziness  or pain currently.    Pertinent History  vision abnormalities, R foot pain, memory loss, HLD, depression, B RTC repair    Limitations  Reading;Lifting;Standing;Walking;House hold activities    Patient Stated Goals  feel balanced again    Currently in Pain?  No/denies         [facility] PT Assessment - 01/27/19 1456      Assessment   Medical Diagnosis  Dizziness    Referring Provider (PT)  Penni Homans, MD    Onset Date/Surgical Date  01/18/19    Next MD Visit  not scheduled    Prior Therapy  no      Precautions   Precautions  None      Balance Screen   Has the patient fallen in the past 6 months  No    Has the patient had a decrease in activity level because of a fear of falling?   No    Is the patient reluctant to leave their home because of a fear of falling?   No      Home Environment   Living Environment  Private residence    Living Arrangements  Spouse/significant other    Available Help at Discharge  Family    Type of [place]  Stairs to enter    [employer] of Steps  1    Entrance Stairs-Rails  None    Home Layout  Two level    Alternate Level Stairs-Number of Steps  12    Alternate Level Stairs-Rails  Right      Prior Function   Level of Independence  Independent    Vocation  Part time employment    Vocation Requirements  working on farm- crawling, walking, bending over    Leisure  reading and knitting      Ambulation/Gait   Assistive device  None    Gait Pattern  Within Functional Limits;Step-through pattern    Ambulation Surface  Level;Indoor    Gait velocity  mildly decreased            Vestibular Assessment - 01/27/19 1500      Oculomotor Exam   Spontaneous  Absent    Gaze-induced   Absent    Smooth Pursuits  Intact   mild dizziness with change in direction   Saccades  Undershoots   slight undershooting superiorly and dizziness   Comment  convergence- mild dizziness and blurriness ~1 foot away      Oculomotor  Exam-Fixation Suppressed    Left Head Impulse  negative    Right Head Impulse  mildly positive      Vestibulo-Ocular Reflex   VOR 1 Head Only (x 1 viewing)  horizontal VOR- difficulty focusing with R head turn; vertical: dizziness    VOR Cancellation  Corrective saccades   mild corrective saccades with R head turn          Objective measurements completed on examination: See above findings.     PT Education - 01/27/19 1527    Education Details  prognosis, POC, HEP; edu on vestibular hypofunction    Person(s) Educated  Patient    Methods  Explanation;Demonstration;Tactile cues;Verbal cues;Handout    Comprehension  Verbalized understanding;Returned demonstration       PT Short Term Goals - 01/27/19 1539      PT SHORT TERM GOAL #1   Title  Patient to be independent with initial HEP.    Time  3    Period  Weeks    Status  New    Target Date  02/17/19        PT Long Term Goals - 01/27/19 1539      PT LONG TERM GOAL #1   Title  Patient to be independent with advanced HEP.    Time  6    Period  Weeks    Status  New    Target Date  03/10/19      PT LONG TERM GOAL #2   Title  Patient to report 0/10 dizziness with standing horizontal and vertical VOR.    Time  6    Period  Weeks    Status  New    Target Date  03/10/19      PT LONG TERM GOAL #3   Title  Patient to report 0/10 dizziness with bed mobility.    Time  6    Period  Weeks    Status  New    Target Date  03/10/19      PT LONG TERM GOAL #4   Title  Patient to score >19/24 on DGI in order to decrease risk of falls.    Time  6    Period  Weeks    Status  New    Target Date  03/10/19  PT LONG TERM GOAL #5   Title  Patient to demonstrate mild sway with M-CTSIB condition EC/foam surface in order to improve ability to manuever in the dark and on unstable surfaces.    Time  6    Period  Weeks    Status  New    Target Date  03/10/19             Plan - 01/27/19 1530    Clinical Impression  Statement  Patient is a 68y/o F presenting to OPPT with c/o dizziness for the past 9 days. Dizziness lasts nearly all day, with exacerbation when getting in/out of bed, bending forward and standing up, and quick head turns. Denies recent infection, fall, tinnitus, hearing loss, otalgia. Oculomotor exam revealed dizziness with smooth pursuits, abnormal saccadic testing vertically, convergence insufficiency, dizziness and difficulty focusing with R horizontal VOR, dizziness with vertical VOR, corrective saccades with R VOR cancellation, and mildly positive R head impulse test. Required intermittent supine rest breaks d/t dizziness. Patient educated on vestibular hypofunction and VOR-focused HEP. Patient reported understanding. Would benefit form skilled PT services 2x/week for 6 weeks to address aforementioned impairments.    Personal Factors and Comorbidities  Age;Comorbidity 3+;Time since onset of injury/illness/exacerbation;Fitness;Past/Current Experience;Profession    Comorbidities  vision abnormalities, R foot pain, memory loss, HLD, depression, B RTC repair    Examination-Activity Limitations  Bed Mobility;Bend;Squat;Caring for Others;Stairs;Carry;Stand;Transfers;Dressing;Lift;Reach Overhead;Locomotion Level    Examination-Participation Restrictions  Church;Cleaning;Shop;Community Activity;Volunteer;Driving;Yard Work;Interpersonal Relationship;Laundry;Meal Prep    Stability/Clinical Decision Making  Evolving/Moderate complexity    Clinical Decision Making  Moderate    Rehab Potential  Good    PT Frequency  2x / week    PT Duration  6 weeks    PT Treatment/Interventions  ADLs/Self Care Home Management;Canalith Repostioning;Moist Heat;Cryotherapy;Balance training;Therapeutic exercise;Therapeutic activities;Functional mobility training;Stair training;Gait training;DME Instruction;Neuromuscular re-education;Patient/family education;Vestibular    PT Next Visit Plan  reassess HEP; assess DGI and M-CTSIB     Consulted and Agree with Plan of Care  Patient       Patient will benefit from skilled therapeutic intervention in order to improve the following deficits and impairments:  Decreased activity tolerance, Decreased balance, Difficulty walking, Dizziness  Visit Diagnosis: Dizziness and giddiness  Unsteadiness on feet     Problem List Patient Active Problem List   Diagnosis Date Noted  . Abscess 01/26/2019  . Dyspareunia in female 11/16/2018  . Preventative health care 04/13/2018  . Cervical cancer screening 04/13/2018  . Obesity 04/13/2018  . Vaginitis 04/13/2018  . Hyperlipidemia 10/09/2017  . Right foot pain 10/09/2017  . Colon polyp 10/09/2017  . Hypertension 07/08/2017  . Left knee pain 05/13/2017  . Memory loss 04/18/2016  . Insomnia 04/18/2016  . Depression with anxiety 04/18/2016  . Gait disturbance 04/18/2016     Janene Harvey, PT, DPT 01/27/19 3:45 PM   [facility] [address]  [place], [place], [address] Phone: [phone]   Fax:  [phone]  Name: Flossie Haston MRN: [id number] Date of Birth: July 02, 1950

## 2019-01-29 DIAGNOSIS — R42 Dizziness and giddiness: Secondary | ICD-10-CM | POA: Insufficient documentation

## 2019-01-29 HISTORY — DX: Dizziness and giddiness: R42

## 2019-01-29 NOTE — Assessment & Plan Note (Signed)
Started on Meclizine prn and encouraged to hydrate and alter activity to move slower. Referred to PT for disequilibrium

## 2019-01-29 NOTE — Assessment & Plan Note (Signed)
Doing well on the combination of Wellbutrin and Sertraline. No changes today.

## 2019-02-02 ENCOUNTER — Encounter: Payer: Self-pay | Admitting: Physical Therapy

## 2019-02-02 ENCOUNTER — Ambulatory Visit: Payer: PPO | Admitting: Physical Therapy

## 2019-02-02 ENCOUNTER — Other Ambulatory Visit: Payer: Self-pay

## 2019-02-02 DIAGNOSIS — R42 Dizziness and giddiness: Secondary | ICD-10-CM

## 2019-02-02 DIAGNOSIS — R2681 Unsteadiness on feet: Secondary | ICD-10-CM

## 2019-02-02 NOTE — Therapy (Signed)
[facility] [address]  [place], [place], [address] Phone: [phone]   Fax:  [phone]  Physical Therapy Treatment  Patient Details  Name: Brittany Salinas MRN: [id number] Date of Birth: [birth year] Referring Provider (PT): Penni Homans, MD   Encounter Date: 02/02/2019  PT End of Session - 02/02/19 1405    Visit Number  2    Number of Visits  13    Date for PT Re-Evaluation  03/10/19    Authorization Type  [employer]    PT Start Time  1313    PT Stop Time  1406    PT Time Calculation (min)  53 min    Activity Tolerance  Patient tolerated treatment well    Behavior During Therapy  [facility] for tasks assessed/performed       Past Medical History:  Diagnosis Date  . Arthritis    OA  . Depression   . Hyperlipidemia   . Insomnia   . Memory loss   . Right foot pain 10/09/2017  . Vision abnormalities     Past Surgical History:  Procedure Laterality Date  . BREAST BIOPSY Right    benign  . BREAST REDUCTION SURGERY  2007  . CESAREAN SECTION     4 c-sections  . REDUCTION MAMMAPLASTY Bilateral   . ROTATOR CUFF REPAIR Bilateral    right in 2010, left in 2017.  . TONSILLECTOMY      Vitals:    Subjective Assessment - 02/02/19 1314    Subjective  Feels that she is getting better and is having shorter episodes of dizziness. Infrequently has feeling of imbalance. 0/10 dizziness currently. Performing HEP 2x daily.    Pertinent History  vision abnormalities, R foot pain, memory loss, HLD, depression, B RTC repair    Patient Stated Goals  feel balanced again    Currently in Pain?  No/denies         [facility] PT Assessment - 02/02/19 0001      Balance   Balance Assessed  Yes      Static Standing Balance   Static Standing Balance -  Activities   Romberg - Eyes Opened;Romberg - Eyes Closed;Romberg - Eyes Opened, Foam;Romberg - Eyes Closed , Foam    Static Standing - Comment/# of Minutes  midl sway with all  conditions, with exception of EC/foam surface demonstrating mild-moderate sway to R      Standardized Balance Assessment   Standardized Balance Assessment  Dynamic Gait Index      Dynamic Gait Index   Level Surface  Normal    Change in Gait Speed  Normal    Gait with Horizontal Head Turns  Mild Impairment    Gait with Vertical Head Turns  Normal    Gait and Pivot Turn  Moderate Impairment    Step Over Obstacle  Normal    Step Around Obstacles  Normal    Steps  Mild Impairment    Total Score  20         Vestibular Assessment - 02/02/19 0001      Positional Testing   Dix-Hallpike  Dix-Hallpike Right;Dix-Hallpike Left      Dix-Hallpike Right   Dix-Hallpike Right Symptoms  No nystagmus   negative for dizziness and nystagmus     Dix-Hallpike Left   Dix-Hallpike Left Symptoms  No nystagmus   negative for dizziness and nystagmus               Vestibular Treatment/Exercise -  02/02/19 0001      Vestibular Treatment/Exercise   Vestibular Treatment Provided  Gaze;Habituation    Habituation Exercises  [employer]    Gaze Exercises  X1 Viewing Horizontal;X1 Viewing Vertical      [name]   Number of Reps   2    Symptom Description   4-5/10 dizziness to each direction      X1 Viewing Horizontal   Foot Position  sitting, standing    Reps  10    Comments  3/10 dizziness sitting, 2.5 standing      X1 Viewing Vertical   Foot Position  sitting, standing    Reps  10    Comments  no dizziness sitting, 1/10 dizziness standing            PT Education - 02/02/19 1404    Education Details  update to HEP; advised ot perform previously adminstered HEP in standing; edu on test results and clinical importance/interpretation    Person(s) Educated  Patient    Methods  Explanation;Demonstration;Tactile cues;Verbal cues;Handout    Comprehension  Verbalized understanding;Returned demonstration       PT Short Term Goals - 02/02/19 1415      PT SHORT TERM GOAL #1    Title  Patient to be independent with initial HEP.    Time  3    Period  Weeks    Status  Achieved    Target Date  02/17/19        PT Long Term Goals - 02/02/19 1415      PT LONG TERM GOAL #1   Title  Patient to be independent with advanced HEP.    Time  6    Period  Weeks    Status  On-going      PT LONG TERM GOAL #2   Title  Patient to report 0/10 dizziness with standing horizontal and vertical VOR.    Time  6    Period  Weeks    Status  On-going      PT LONG TERM GOAL #3   Title  Patient to report 0/10 dizziness with bed mobility.    Time  6    Period  Weeks    Status  On-going      PT LONG TERM GOAL #4   Title  Patient to score >19/24 on DGI in order to decrease risk of falls.    Time  6    Period  Weeks    Status  Achieved   20/24     PT LONG TERM GOAL #5   Title  Patient to demonstrate mild sway with M-CTSIB condition EC/foam surface in order to improve ability to manuever in the dark and on unstable surfaces.    Time  6    Period  Weeks    Status  Partially Met   demonstrated mild-moderate sway           Plan - 02/02/19 1407    Clinical Impression Statement  Patient arrived to session with report of improvement in length of dizziness episodes. Patient scored 20/24 on DGI, scoring her out of the fall risk category. Demonstrated mild-moderate sway with last condition of M-CTSIB, with most sway to R. Tested R and L Dix Hallpike to clear for BPPV, which were both negative for symptoms and nystagmus. Patient did c/o dizziness on return from supine, thus tested orthostatics which were also negative. Reviewed sitting and progressed to standing, VOR exercises. Patient able to tolerate quick head  movements with mild c/o dizziness. Introduced [employer] which patient was more symptomatic with. Updated HEP with [name] and advised patient to perform previously administered HEP in standing. Patient reported understanding. Allowed for sitting rest break for  symptoms to fully dissipate in order to let patient leave session safely.    Comorbidities  vision abnormalities, R foot pain, memory loss, HLD, depression, B RTC repair    PT Treatment/Interventions  ADLs/Self Care Home Management;Canalith Repostioning;Moist Heat;Cryotherapy;Balance training;Therapeutic exercise;Therapeutic activities;Functional mobility training;Stair training;Gait training;DME Instruction;Neuromuscular re-education;Patient/family education;Vestibular    PT Next Visit Plan  progress gaze stabilization and habituation exercises    Consulted and Agree with Plan of Care  Patient       Patient will benefit from skilled therapeutic intervention in order to improve the following deficits and impairments:  Decreased activity tolerance, Decreased balance, Difficulty walking, Dizziness  Visit Diagnosis: Dizziness and giddiness  Unsteadiness on feet     Problem List Patient Active Problem List   Diagnosis Date Noted  . Vertigo 01/29/2019  . Dyspareunia in female 11/16/2018  . Preventative health care 04/13/2018  . Cervical cancer screening 04/13/2018  . Obesity 04/13/2018  . Vaginitis 04/13/2018  . Hyperlipidemia 10/09/2017  . Right foot pain 10/09/2017  . Colon polyp 10/09/2017  . Hypertension 07/08/2017  . Left knee pain 05/13/2017  . Memory loss 04/18/2016  . Insomnia 04/18/2016  . Depression with anxiety 04/18/2016  . Gait disturbance 04/18/2016    Janene Harvey, PT, DPT 02/02/19 2:20 PM   [facility] [address]  [place], [place], [address] Phone: [phone]   Fax:  [phone]  Name: Melani Brisbane MRN: [id number] Date of Birth: 06-29-1950

## 2019-02-04 ENCOUNTER — Other Ambulatory Visit: Payer: Self-pay | Admitting: Family Medicine

## 2019-02-05 ENCOUNTER — Encounter: Payer: Self-pay | Admitting: Physical Therapy

## 2019-02-05 ENCOUNTER — Other Ambulatory Visit: Payer: Self-pay

## 2019-02-05 ENCOUNTER — Ambulatory Visit: Payer: PPO | Attending: Family Medicine | Admitting: Physical Therapy

## 2019-02-05 DIAGNOSIS — R42 Dizziness and giddiness: Secondary | ICD-10-CM | POA: Diagnosis not present

## 2019-02-05 DIAGNOSIS — R2681 Unsteadiness on feet: Secondary | ICD-10-CM

## 2019-02-05 NOTE — Therapy (Signed)
[facility] [address]  [place], [place], [address] Phone: [phone]   Fax:  [phone]  Physical Therapy Treatment  Patient Details  Name: Brittany Salinas MRN: [id number] Date of Birth: February 24, 1951 Referring Provider (PT): Penni Homans, MD   Encounter Date: 02/05/2019  PT End of Session - 02/05/19 1112    Visit Number  3    Number of Visits  13    Date for PT Re-Evaluation  03/10/19    Authorization Type  [employer]    PT Start Time  0934    PT Stop Time  1018    PT Time Calculation (min)  44 min    Activity Tolerance  Patient tolerated treatment well    Behavior During Therapy  [facility] for tasks assessed/performed       Past Medical History:  Diagnosis Date  . Arthritis    OA  . Depression   . Hyperlipidemia   . Insomnia   . Memory loss   . Right foot pain 10/09/2017  . Vision abnormalities     Past Surgical History:  Procedure Laterality Date  . BREAST BIOPSY Right    benign  . BREAST REDUCTION SURGERY  2007  . CESAREAN SECTION     4 c-sections  . REDUCTION MAMMAPLASTY Bilateral   . ROTATOR CUFF REPAIR Bilateral    right in 2010, left in 2017.  . TONSILLECTOMY      There were no vitals filed for this visit.  Subjective Assessment - 02/05/19 0933    Subjective  Reports that symptoms are about the same. 0/10 dizziness currently.    Pertinent History  vision abnormalities, R foot pain, memory loss, HLD, depression, B RTC repair    Patient Stated Goals  feel balanced again    Currently in Pain?  No/denies                       [facility] Adult PT Treatment/Exercise - 02/05/19 0001      Neuro Re-ed    Neuro Re-ed Details   leaning on R/L forearm + gaze stabilization x5 each side EO and EC; STS on foam + gaze stabilization x10; STS on foam with EC x10; sitting anterior habituation 3x each side with 10 sec hold   4/10 dizziness with anterior habiutuation     Vestibular  Treatment/Exercise - 02/05/19 0001      Vestibular Treatment/Exercise   Habituation Exercises  Brittany Salinas   Number of Reps   3    Symptom Description   1x EO, 2x EC   2-3/10 dizziness with EO; 4-5/10 dizziness with EC           PT Education - 02/05/19 1010    Education Details  update to HEP    Person(s) Educated  Patient    Methods  Explanation;Demonstration;Tactile cues;Verbal cues;Handout    Comprehension  Verbalized understanding;Returned demonstration       PT Short Term Goals - 02/02/19 1415      PT SHORT TERM GOAL #1   Title  Patient to be independent with initial HEP.    Time  3    Period  Weeks    Status  Achieved    Target Date  02/17/19        PT Long Term Goals - 02/02/19 1415      PT LONG TERM GOAL #1   Title  Patient to  be independent with advanced HEP.    Time  6    Period  Weeks    Status  On-going      PT LONG TERM GOAL #2   Title  Patient to report 0/10 dizziness with standing horizontal and vertical VOR.    Time  6    Period  Weeks    Status  On-going      PT LONG TERM GOAL #3   Title  Patient to report 0/10 dizziness with bed mobility.    Time  6    Period  Weeks    Status  On-going      PT LONG TERM GOAL #4   Title  Patient to score >19/24 on DGI in order to decrease risk of falls.    Time  6    Period  Weeks    Status  Achieved   20/24     PT LONG TERM GOAL #5   Title  Patient to demonstrate mild sway with M-CTSIB condition EC/foam surface in order to improve ability to manuever in the dark and on unstable surfaces.    Time  6    Period  Weeks    Status  Partially Met   demonstrated mild-moderate sway           Plan - 02/05/19 1112    Clinical Impression Statement  Patient reporting no new complaints. Reviewed Brittany Salinas with patient reporting improvement in dizziness compared to previous session. Progressed this exercise to eyes closed with patient reporting increased severity of symptoms.  Challenged gaze stabilization in sitting with patient tolerating this well. Again, with more difficulty and c/o disorientation with EC. Introduced STS on foam which patient performed at good speed and demonstrated good stability. Continued to maintain good stability with EC, but reporting more challenge. Introduced sitting anterior habituation exercise as patient continues to report most dizziness episodes with standing back up from leaning forward. Updated HEP with exercises that were well-tolerated today- patient reported understanding. Advised patient to have a seat in the waiting room until dizziness dissipated completely before driving home. Patient agreeable.    Comorbidities  vision abnormalities, R foot pain, memory loss, HLD, depression, B RTC repair    PT Treatment/Interventions  ADLs/Self Care Home Management;Canalith Repostioning;Moist Heat;Cryotherapy;Balance training;Therapeutic exercise;Therapeutic activities;Functional mobility training;Stair training;Gait training;DME Instruction;Neuromuscular re-education;Patient/family education;Vestibular    PT Next Visit Plan  progress gaze stabilization and habituation exercises    Consulted and Agree with Plan of Care  Patient       Patient will benefit from skilled therapeutic intervention in order to improve the following deficits and impairments:  Decreased activity tolerance, Decreased balance, Difficulty walking, Dizziness  Visit Diagnosis: Dizziness and giddiness  Unsteadiness on feet     Problem List Patient Active Problem List   Diagnosis Date Noted  . Vertigo 01/29/2019  . Dyspareunia in female 11/16/2018  . Preventative health care 04/13/2018  . Cervical cancer screening 04/13/2018  . Obesity 04/13/2018  . Vaginitis 04/13/2018  . Hyperlipidemia 10/09/2017  . Right foot pain 10/09/2017  . Colon polyp 10/09/2017  . Hypertension 07/08/2017  . Left knee pain 05/13/2017  . Memory loss 04/18/2016  . Insomnia 04/18/2016  .  Depression with anxiety 04/18/2016  . Gait disturbance 04/18/2016     Janene Harvey, PT, DPT 02/05/19 11:15 AM   [facility] [address]  [place], [place], [address] Phone: [phone]   Fax:  [phone]  Name: Brittany Salinas MRN: [id number] Date of Birth: 1950-07-31

## 2019-02-05 NOTE — Patient Instructions (Addendum)
Brittany Salinas with EC 2x5 each side Leaning side to side on forearm with EC 2x5 each side STS with EC 2x10 Sitting anterior habituation 2x3 each side with 10 sec hold

## 2019-02-15 ENCOUNTER — Encounter: Payer: Self-pay | Admitting: Physical Therapy

## 2019-02-15 ENCOUNTER — Ambulatory Visit: Payer: PPO | Admitting: Physical Therapy

## 2019-02-15 ENCOUNTER — Other Ambulatory Visit: Payer: Self-pay

## 2019-02-15 DIAGNOSIS — R42 Dizziness and giddiness: Secondary | ICD-10-CM

## 2019-02-15 DIAGNOSIS — R2681 Unsteadiness on feet: Secondary | ICD-10-CM

## 2019-02-15 NOTE — Therapy (Signed)
[facility] [address]  [place], [place], [address] Phone: [phone]   Fax:  [phone]  Physical Therapy Treatment  Patient Details  Name: Brittany Salinas MRN: [id number] Date of Birth: [birth year] Referring Provider (PT): Penni Homans, MD   Encounter Date: 02/15/2019  PT End of Session - 02/15/19 1221    Visit Number  4    Number of Visits  13    Date for PT Re-Evaluation  03/10/19    Authorization Type  [employer]    PT Start Time  0933    PT Stop Time  1015    PT Time Calculation (min)  42 min    Equipment Utilized During Treatment  Gait belt    Activity Tolerance  Patient tolerated treatment well    Behavior During Therapy  [facility] for tasks assessed/performed       Past Medical History:  Diagnosis Date  . Arthritis    OA  . Depression   . Hyperlipidemia   . Insomnia   . Memory loss   . Right foot pain 10/09/2017  . Vision abnormalities     Past Surgical History:  Procedure Laterality Date  . BREAST BIOPSY Right    benign  . BREAST REDUCTION SURGERY  2007  . CESAREAN SECTION     4 c-sections  . REDUCTION MAMMAPLASTY Bilateral   . ROTATOR CUFF REPAIR Bilateral    right in 2010, left in 2017.  . TONSILLECTOMY      There were no vitals filed for this visit.  Subjective Assessment - 02/15/19 0937    Subjective  Reports that she is noticing a slow but steady progression. Still has moments when dizziness sneaks up on her. 0/10 dizziness currently.    Pertinent History  vision abnormalities, R foot pain, memory loss, HLD, depression, B RTC repair    Patient Stated Goals  feel balanced again    Currently in Pain?  No/denies        Treatment:  Laruth Bouchard Daroff 1x each side EO- 1/10 dizziness   Laruth Bouchard Daroff 1x each side EC- 2/10 dizziness   Leaning R/L on forearm + gaze stabilization + feet on dynadisc 3x each side- 0/10 dizziness  Leaning R/L on forearm with EC + feet on dynadisc 3x each  side- 2/10 dizziness  Sitting anterior habituation EO 2x each side- 1/10 dizziness  Sitting anterior habituation EC 2x each side- 3/10 dizziness and cues to tuck chin further into chest  STS on foam with EC 10x- 3/10 dizziness and visible consistent wt shift posteriorly   Stepping forward/back with R/L foot + head turns up/down x10 each side- 3/10 dizziness and CGA  Walking around gym to retrieve 10 cones- good tolerance                        PT Education - 02/15/19 1221    Education Details  re-adminsitered and reviewed previous HEP as patient lost it    Person(s) Educated  Patient    Methods  Explanation;Demonstration;Tactile cues;Verbal cues;Handout    Comprehension  Verbalized understanding;Returned demonstration       PT Short Term Goals - 02/02/19 1415      PT SHORT TERM GOAL #1   Title  Patient to be independent with initial HEP.    Time  3    Period  Weeks    Status  Achieved    Target Date  02/17/19  PT Long Term Goals - 02/02/19 1415      PT LONG TERM GOAL #1   Title  Patient to be independent with advanced HEP.    Time  6    Period  Weeks    Status  On-going      PT LONG TERM GOAL #2   Title  Patient to report 0/10 dizziness with standing horizontal and vertical VOR.    Time  6    Period  Weeks    Status  On-going      PT LONG TERM GOAL #3   Title  Patient to report 0/10 dizziness with bed mobility.    Time  6    Period  Weeks    Status  On-going      PT LONG TERM GOAL #4   Title  Patient to score >19/24 on DGI in order to decrease risk of falls.    Time  6    Period  Weeks    Status  Achieved   20/24     PT LONG TERM GOAL #5   Title  Patient to demonstrate mild sway with M-CTSIB condition EC/foam surface in order to improve ability to manuever in the dark and on unstable surfaces.    Time  6    Period  Weeks    Status  Partially Met   demonstrated mild-moderate sway           Plan - 02/15/19 1222     Clinical Impression Statement  Patient reporting slow but steady progression in her dizziness, noting that each week is better than the last. Now able to work in the garden and clean the house with less dizziness. Reviewed and re-administered previous HEP handout as patient reporting that she lost it. Patient reported understanding. Patient overall tolerating gaze stabilization and habituation exercises much better today, with good improvement in exercise tolerance. Reported most challenge with anterior habituation and dynamic balance activity with head turns. Patient without complaints at end of session. Patient is progressing well towards goals.    Comorbidities  vision abnormalities, R foot pain, memory loss, HLD, depression, B RTC repair    PT Treatment/Interventions  ADLs/Self Care Home Management;Canalith Repostioning;Moist Heat;Cryotherapy;Balance training;Therapeutic exercise;Therapeutic activities;Functional mobility training;Stair training;Gait training;DME Instruction;Neuromuscular re-education;Patient/family education;Vestibular    PT Next Visit Plan  progress gaze stabilization and habituation exercises    Consulted and Agree with Plan of Care  Patient       Patient will benefit from skilled therapeutic intervention in order to improve the following deficits and impairments:  Decreased activity tolerance, Decreased balance, Difficulty walking, Dizziness  Visit Diagnosis: Dizziness and giddiness  Unsteadiness on feet     Problem List Patient Active Problem List   Diagnosis Date Noted  . Vertigo 01/29/2019  . Dyspareunia in female 11/16/2018  . Preventative health care 04/13/2018  . Cervical cancer screening 04/13/2018  . Obesity 04/13/2018  . Vaginitis 04/13/2018  . Hyperlipidemia 10/09/2017  . Right foot pain 10/09/2017  . Colon polyp 10/09/2017  . Hypertension 07/08/2017  . Left knee pain 05/13/2017  . Memory loss 04/18/2016  . Insomnia 04/18/2016  . Depression with  anxiety 04/18/2016  . Gait disturbance 04/18/2016     Janene Harvey, PT, DPT 02/15/19 12:28 PM   [facility] [address]  [place], [place], [address] Phone: [phone]   Fax:  [phone]  Name: Brittany Salinas MRN: [id number] Date of Birth: 20-Jun-1950

## 2019-02-16 DIAGNOSIS — I781 Nevus, non-neoplastic: Secondary | ICD-10-CM | POA: Diagnosis not present

## 2019-02-16 DIAGNOSIS — L821 Other seborrheic keratosis: Secondary | ICD-10-CM | POA: Diagnosis not present

## 2019-02-16 DIAGNOSIS — D225 Melanocytic nevi of trunk: Secondary | ICD-10-CM | POA: Diagnosis not present

## 2019-02-16 DIAGNOSIS — L718 Other rosacea: Secondary | ICD-10-CM | POA: Diagnosis not present

## 2019-02-16 DIAGNOSIS — D2271 Melanocytic nevi of right lower limb, including hip: Secondary | ICD-10-CM | POA: Diagnosis not present

## 2019-02-17 ENCOUNTER — Ambulatory Visit: Payer: PPO | Admitting: Physical Therapy

## 2019-02-17 ENCOUNTER — Encounter: Payer: Self-pay | Admitting: Physical Therapy

## 2019-02-17 ENCOUNTER — Other Ambulatory Visit: Payer: Self-pay

## 2019-02-17 DIAGNOSIS — R42 Dizziness and giddiness: Secondary | ICD-10-CM

## 2019-02-17 DIAGNOSIS — R2681 Unsteadiness on feet: Secondary | ICD-10-CM

## 2019-02-17 NOTE — Patient Instructions (Addendum)
Standing anterior habituation with eyes open- 2x3 reps each side with chair at side as needed for stability

## 2019-02-17 NOTE — Therapy (Signed)
[facility] [address]  [place], [place], [address] Phone: [phone]   Fax:  [phone]  Physical Therapy Treatment  Patient Details  Name: Brittany Salinas MRN: [id number] Date of Birth: [birth year] Referring Provider (PT): Penni Homans, MD   Encounter Date: 02/17/2019  PT End of Session - 02/17/19 1015    Visit Number  5    Number of Visits  13    Date for PT Re-Evaluation  03/10/19    Authorization Type  [employer]    PT Start Time  0935    PT Stop Time  1014    PT Time Calculation (min)  39 min    Equipment Utilized During Treatment  Gait belt    Activity Tolerance  Patient tolerated treatment well    Behavior During Therapy  [facility] for tasks assessed/performed       Past Medical History:  Diagnosis Date  . Arthritis    OA  . Depression   . Hyperlipidemia   . Insomnia   . Memory loss   . Right foot pain 10/09/2017  . Vision abnormalities     Past Surgical History:  Procedure Laterality Date  . BREAST BIOPSY Right    benign  . BREAST REDUCTION SURGERY  2007  . CESAREAN SECTION     4 c-sections  . REDUCTION MAMMAPLASTY Bilateral   . ROTATOR CUFF REPAIR Bilateral    right in 2010, left in 2017.  . TONSILLECTOMY      There were no vitals filed for this visit.  Subjective Assessment - 02/17/19 0934    Subjective  Nothing new since last session. 0/10 dizziness currently. Performed the exercises once since last session and they went well. no questions.    Pertinent History  vision abnormalities, R foot pain, memory loss, HLD, depression, B RTC repair    Patient Stated Goals  feel balanced again    Currently in Pain?  No/denies        Treatment:  Sitting anterior habituation EC 10"x3 each side- 1-2/10 dizziness   Standing anterior habituation EO 10"x3 each side- 2-3/10 dizziness   Standing marching on foam + gaze stabilization 2x20 with CGA- no dizziness but evident unsteadiness   STS  on foam EC x10- good stability and no dizziness  STS on foam EC + vertical head turns x10 with CGA- c/o feeling off balance   Cornhole with agility ladder and beanbags x1 to each side- 4-5/10 dizziness             PT Education - 02/17/19 1014    Education Details  update to HEP    Person(s) Educated  Patient    Methods  Explanation;Demonstration;Tactile cues;Verbal cues;Handout    Comprehension  Verbalized understanding;Returned demonstration       PT Short Term Goals - 02/02/19 1415      PT SHORT TERM GOAL #1   Title  Patient to be independent with initial HEP.    Time  3    Period  Weeks    Status  Achieved    Target Date  02/17/19        PT Long Term Goals - 02/02/19 1415      PT LONG TERM GOAL #1   Title  Patient to be independent with advanced HEP.    Time  6    Period  Weeks    Status  On-going      PT LONG TERM GOAL #2  Title  Patient to report 0/10 dizziness with standing horizontal and vertical VOR.    Time  6    Period  Weeks    Status  On-going      PT LONG TERM GOAL #3   Title  Patient to report 0/10 dizziness with bed mobility.    Time  6    Period  Weeks    Status  On-going      PT LONG TERM GOAL #4   Title  Patient to score >19/24 on DGI in order to decrease risk of falls.    Time  6    Period  Weeks    Status  Achieved   20/24     PT LONG TERM GOAL #5   Title  Patient to demonstrate mild sway with M-CTSIB condition EC/foam surface in order to improve ability to manuever in the dark and on unstable surfaces.    Time  6    Period  Weeks    Status  Partially Met   demonstrated mild-moderate sway           Plan - 02/17/19 1015    Clinical Impression Statement  Patient without new complaints this session. Able to perform sitting anterior habituation with EC with much improved tolerance. Able to progress to standing anterior habituation with mild increase symptoms but good overall stability and tolerance. Worked on dynamic  balance on foam with patient demonstrating unsteadiness which improved with increased reps. Continued to challenge motion senility with cornhole game, which patient was most symptomatic with, but demonstrated good balance. Required sitting rest break to allow dizziness and nausea to subside. Updated HEP with standing anterior habituation as this was well-tolerated today. Patient reported understanding and with no further complaints at end of session.    Comorbidities  vision abnormalities, R foot pain, memory loss, HLD, depression, B RTC repair    PT Treatment/Interventions  ADLs/Self Care Home Management;Canalith Repostioning;Moist Heat;Cryotherapy;Balance training;Therapeutic exercise;Therapeutic activities;Functional mobility training;Stair training;Gait training;DME Instruction;Neuromuscular re-education;Patient/family education;Vestibular    PT Next Visit Plan  progress gaze stabilization and habituation exercises    Consulted and Agree with Plan of Care  Patient       Patient will benefit from skilled therapeutic intervention in order to improve the following deficits and impairments:  Decreased activity tolerance, Decreased balance, Difficulty walking, Dizziness  Visit Diagnosis: Dizziness and giddiness  Unsteadiness on feet     Problem List Patient Active Problem List   Diagnosis Date Noted  . Vertigo 01/29/2019  . Dyspareunia in female 11/16/2018  . Preventative health care 04/13/2018  . Cervical cancer screening 04/13/2018  . Obesity 04/13/2018  . Vaginitis 04/13/2018  . Hyperlipidemia 10/09/2017  . Right foot pain 10/09/2017  . Colon polyp 10/09/2017  . Hypertension 07/08/2017  . Left knee pain 05/13/2017  . Memory loss 04/18/2016  . Insomnia 04/18/2016  . Depression with anxiety 04/18/2016  . Gait disturbance 04/18/2016     Janene Harvey, PT, DPT 02/17/19 10:16 AM   [facility] [address]  [place], [place], [address] Phone: [phone]   Fax:  [phone]  Name: Brentney Goldbach MRN: [id number] Date of Birth: 22-Oct-1950

## 2019-03-01 ENCOUNTER — Encounter: Payer: Self-pay | Admitting: Physical Therapy

## 2019-03-01 ENCOUNTER — Ambulatory Visit: Payer: PPO | Admitting: Physical Therapy

## 2019-03-01 ENCOUNTER — Other Ambulatory Visit: Payer: Self-pay

## 2019-03-01 DIAGNOSIS — R42 Dizziness and giddiness: Secondary | ICD-10-CM | POA: Diagnosis not present

## 2019-03-01 DIAGNOSIS — R2681 Unsteadiness on feet: Secondary | ICD-10-CM

## 2019-03-01 NOTE — Therapy (Signed)
[facility] [address]  [place], [place], [address] Phone: [phone]   Fax:  [phone]  Physical Therapy Treatment  Patient Details  Name: Brittany Salinas MRN: [id number] Date of Birth: [birth year] Referring Provider (PT): Penni Homans, MD   Encounter Date: 03/01/2019  PT End of Session - 03/01/19 1208    Visit Number  6    Number of Visits  13    Date for PT Re-Evaluation  03/10/19    Authorization Type  [employer]    PT Start Time  0937   pt late   PT Stop Time  1015    PT Time Calculation (min)  38 min    Equipment Utilized During Treatment  Gait belt    Activity Tolerance  Patient tolerated treatment well    Behavior During Therapy  [facility] for tasks assessed/performed       Past Medical History:  Diagnosis Date  . Arthritis    OA  . Depression   . Hyperlipidemia   . Insomnia   . Memory loss   . Right foot pain 10/09/2017  . Vision abnormalities     Past Surgical History:  Procedure Laterality Date  . BREAST BIOPSY Right    benign  . BREAST REDUCTION SURGERY  2007  . CESAREAN SECTION     4 c-sections  . REDUCTION MAMMAPLASTY Bilateral   . ROTATOR CUFF REPAIR Bilateral    right in 2010, left in 2017.  . TONSILLECTOMY      There were no vitals filed for this visit.  Subjective Assessment - 03/01/19 0938    Subjective  Has been doing well. 0/10 dizziness currently. Had to do a lot walking on her vacation but occasionally she noticed herself weaving, which was a problem prior to vertigo onset. Episodes of dizziness are becoming less frequent. Had one bad morning while on vacation.    Pertinent History  vision abnormalities, R foot pain, memory loss, HLD, depression, B RTC repair    Patient Stated Goals  feel balanced again    Currently in Pain?  No/denies         Treatment:   standing anterior habituation EO 3x each side with 10" hold each- 1-2/10 dizziness and good stability   STS  on foam EC x10- c/o unsteadiness and observed posterior weight shift  Marching EC with CGA 2x20- c/o feeling "very unsteady and a little dizzy"  Standing on foam + horizontal VOR x10 with CGA- 2/10 dizziness and unsteadiness  Standing on foam + vertical VOR x10 with CGA- 0/10 dizziness and unsteadiness  Standing on foam + horizontal head turns x10 with CGA- 0/10 dizziness and improved stability  Standing on foam + vertical head turns x10 with CGA- 2/10 dizziness   Posterior stepping over pincushion in agility ladder + picking up cushion with CGA- c/o more unsteadiness when stepping back with L LE                        PT Short Term Goals - 02/02/19 1415      PT SHORT TERM GOAL #1   Title  Patient to be independent with initial HEP.    Time  3    Period  Weeks    Status  Achieved    Target Date  02/17/19        PT Long Term Goals - 02/02/19 1415      PT LONG TERM GOAL #  1   Title  Patient to be independent with advanced HEP.    Time  6    Period  Weeks    Status  On-going      PT LONG TERM GOAL #2   Title  Patient to report 0/10 dizziness with standing horizontal and vertical VOR.    Time  6    Period  Weeks    Status  On-going      PT LONG TERM GOAL #3   Title  Patient to report 0/10 dizziness with bed mobility.    Time  6    Period  Weeks    Status  On-going      PT LONG TERM GOAL #4   Title  Patient to score >19/24 on DGI in order to decrease risk of falls.    Time  6    Period  Weeks    Status  Achieved   20/24     PT LONG TERM GOAL #5   Title  Patient to demonstrate mild sway with M-CTSIB condition EC/foam surface in order to improve ability to manuever in the dark and on unstable surfaces.    Time  6    Period  Weeks    Status  Partially Met   demonstrated mild-moderate sway           Plan - 03/01/19 1209    Clinical Impression Statement  Patient reporting no new symptoms. Noting that episodes of dizziness have become less  frequent. Admits to limited compliance with HEP while on vacation, thus advised patient to return to performing HEP daily. Worked on anterior habituation exercises which were well-tolerated today. Introduced standing marching with EC with patient reporting mild dizziness and evident unsteadiness. Increased challenge with stance VOR exercises with addition of foam pad. Patient reporting more dizziness with horizontal vs. vertical had turns. Performed these same exercises with EC, with patient reporting decreased dizziness with EC during horizontal head turns. Patient overall reporting more unsteadiness than dizziness this session. Reported no complaints at end of session. Demonstrating continued improvement in exercise tolerance.    Comorbidities  vision abnormalities, R foot pain, memory loss, HLD, depression, B RTC repair    PT Treatment/Interventions  ADLs/Self Care Home Management;Canalith Repostioning;Moist Heat;Cryotherapy;Balance training;Therapeutic exercise;Therapeutic activities;Functional mobility training;Stair training;Gait training;DME Instruction;Neuromuscular re-education;Patient/family education;Vestibular    PT Next Visit Plan  progress gaze stabilization and habituation exercises    Consulted and Agree with Plan of Care  Patient       Patient will benefit from skilled therapeutic intervention in order to improve the following deficits and impairments:  Decreased activity tolerance, Decreased balance, Difficulty walking, Dizziness  Visit Diagnosis: Dizziness and giddiness  Unsteadiness on feet     Problem List Patient Active Problem List   Diagnosis Date Noted  . Vertigo 01/29/2019  . Dyspareunia in female 11/16/2018  . Preventative health care 04/13/2018  . Cervical cancer screening 04/13/2018  . Obesity 04/13/2018  . Vaginitis 04/13/2018  . Hyperlipidemia 10/09/2017  . Right foot pain 10/09/2017  . Colon polyp 10/09/2017  . Hypertension 07/08/2017  . Left knee pain  05/13/2017  . Memory loss 04/18/2016  . Insomnia 04/18/2016  . Depression with anxiety 04/18/2016  . Gait disturbance 04/18/2016     Janene Harvey, PT, DPT 03/01/19 12:11 PM   [facility] [address]  [place], [place], [address] Phone: [phone]   Fax:  [phone]  Name: Cherokee Boccio MRN: [id number] Date  of Birth: Feb 11, 1951

## 2019-03-04 ENCOUNTER — Other Ambulatory Visit: Payer: Self-pay

## 2019-03-04 ENCOUNTER — Ambulatory Visit: Payer: PPO | Admitting: Physical Therapy

## 2019-03-04 ENCOUNTER — Encounter: Payer: Self-pay | Admitting: Physical Therapy

## 2019-03-04 DIAGNOSIS — R42 Dizziness and giddiness: Secondary | ICD-10-CM

## 2019-03-04 DIAGNOSIS — R2681 Unsteadiness on feet: Secondary | ICD-10-CM

## 2019-03-04 NOTE — Therapy (Signed)
[facility] [address]  [place], [place], [address] Phone: [phone]   Fax:  [phone]  Physical Therapy Treatment  Patient Details  Name: Brittany Salinas MRN: [id number] Date of Birth: [birth year] Referring Provider (PT): Penni Homans, MD   Encounter Date: 03/04/2019  PT End of Session - 03/04/19 1022    Visit Number  7    Number of Visits  13    Date for PT Re-Evaluation  03/10/19    Authorization Type  [employer]    PT Start Time  0933    PT Stop Time  1017    PT Time Calculation (min)  44 min    Equipment Utilized During Treatment  Gait belt    Activity Tolerance  Patient tolerated treatment well    Behavior During Therapy  [facility] for tasks assessed/performed       Past Medical History:  Diagnosis Date  . Arthritis    OA  . Depression   . Hyperlipidemia   . Insomnia   . Memory loss   . Right foot pain 10/09/2017  . Vision abnormalities     Past Surgical History:  Procedure Laterality Date  . BREAST BIOPSY Right    benign  . BREAST REDUCTION SURGERY  2007  . CESAREAN SECTION     4 c-sections  . REDUCTION MAMMAPLASTY Bilateral   . ROTATOR CUFF REPAIR Bilateral    right in 2010, left in 2017.  . TONSILLECTOMY      There were no vitals filed for this visit.  Subjective Assessment - 03/04/19 0934    Subjective  Felt a little discouraged after last appointment because she feels that her dizziness is no longer the pain problem, but rather her wobbliness.0/10 dizziness at baseline.    Pertinent History  vision abnormalities, R foot pain, memory loss, HLD, depression, B RTC repair    Patient Stated Goals  feel balanced again        Treatment:   standing anterior habituation EC 3x each side with CGA- 1/10 dizziness and c/o imbalance towards R  marching EC 2x20- rotation towards L and narrow BOS  walking with EC and CGA 2x86f- good stability   Walking backwards with EC and CGA 2x354f  increased hesitation  standing on foam EC head nod up/down 2x10- 1/10 dizziness  standing on foam EC head turns R/L 2x10- cues to decrease speed d/t heavy ant/pos sway and 3/10 dizziness  stand on foam + turning head R/L to 2 targets x20- 0/10 dizziness   1 foot on foam + head turns R/L to 2 targets x20 each LE- 0/10 dizziness on R, 2/10 dizziness and wobbliness on L  Rotating back to pick up beanbag from floor + step back into agility ladder square- 3/10 dizziness        PT Education - 03/04/19 1022    Education Details  update to HE[facility] DennisonEducated  Patient    Methods  Explanation;Demonstration;Tactile cues;Verbal cues;Handout    Comprehension  Verbalized understanding;Returned demonstration       PT Short Term Goals - 02/02/19 1415      PT SHORT TERM GOAL #1   Title  Patient to be independent with initial HEP.    Time  3    Period  Weeks    Status  Achieved    Target Date  02/17/19        PT Long Term Goals - 02/02/19 1415  PT LONG TERM GOAL #1   Title  Patient to be independent with advanced HEP.    Time  6    Period  Weeks    Status  On-going      PT LONG TERM GOAL #2   Title  Patient to report 0/10 dizziness with standing horizontal and vertical VOR.    Time  6    Period  Weeks    Status  On-going      PT LONG TERM GOAL #3   Title  Patient to report 0/10 dizziness with bed mobility.    Time  6    Period  Weeks    Status  On-going      PT LONG TERM GOAL #4   Title  Patient to score >19/24 on DGI in order to decrease risk of falls.    Time  6    Period  Weeks    Status  Achieved   20/24     PT LONG TERM GOAL #5   Title  Patient to demonstrate mild sway with M-CTSIB condition EC/foam surface in order to improve ability to manuever in the dark and on unstable surfaces.    Time  6    Period  Weeks    Status  Partially Met   demonstrated mild-moderate sway           Plan - 03/04/19 1023    Clinical Impression Statement  Patient  reporting discouragement since last session d/t feeling like her imbalance is now more of an issue than dizziness. Believe patient's imbalance is not worsening, but rather that she is more aware of imbalance d/t good improvement in dizziness symptoms. Increased challenge with anterior habituation by having patient close eyes, which patient tolerated well. Patient continued to report imbalance with marching with eyes closed, which improved with cues to widen BOS. Patient required cues to decrease speed with standing on foam head turns d/t severe anterior/posterior sway. Updated HEP with walking with eyes closed and walking backwards as patient was safe but hesitant with this activity. Patient reported understanding. No complaints at end of session.    Comorbidities  vision abnormalities, R foot pain, memory loss, HLD, depression, B RTC repair    PT Treatment/Interventions  ADLs/Self Care Home Management;Canalith Repostioning;Moist Heat;Cryotherapy;Balance training;Therapeutic exercise;Therapeutic activities;Functional mobility training;Stair training;Gait training;DME Instruction;Neuromuscular re-education;Patient/family education;Vestibular    PT Next Visit Plan  progress gaze stabilization and habituation exercises    Consulted and Agree with Plan of Care  Patient       Patient will benefit from skilled therapeutic intervention in order to improve the following deficits and impairments:  Decreased activity tolerance, Decreased balance, Difficulty walking, Dizziness  Visit Diagnosis: Dizziness and giddiness  Unsteadiness on feet     Problem List Patient Active Problem List   Diagnosis Date Noted  . Vertigo 01/29/2019  . Dyspareunia in female 11/16/2018  . Preventative health care 04/13/2018  . Cervical cancer screening 04/13/2018  . Obesity 04/13/2018  . Vaginitis 04/13/2018  . Hyperlipidemia 10/09/2017  . Right foot pain 10/09/2017  . Colon polyp 10/09/2017  . Hypertension 07/08/2017   . Left knee pain 05/13/2017  . Memory loss 04/18/2016  . Insomnia 04/18/2016  . Depression with anxiety 04/18/2016  . Gait disturbance 04/18/2016     Janene Harvey, PT, DPT 03/04/19 10:32 AM   [facility] [place], [place], [address] Phone: [phone]   Fax:  [phone]  Name:  Brittany Salinas MRN: [id number] Date of Birth: 1950-05-22

## 2019-03-08 ENCOUNTER — Encounter: Payer: PPO | Admitting: Physical Therapy

## 2019-03-10 ENCOUNTER — Encounter: Payer: Self-pay | Admitting: Physical Therapy

## 2019-03-10 ENCOUNTER — Ambulatory Visit: Payer: PPO | Attending: Family Medicine | Admitting: Physical Therapy

## 2019-03-10 ENCOUNTER — Other Ambulatory Visit: Payer: Self-pay

## 2019-03-10 DIAGNOSIS — R42 Dizziness and giddiness: Secondary | ICD-10-CM | POA: Diagnosis not present

## 2019-03-10 DIAGNOSIS — R2681 Unsteadiness on feet: Secondary | ICD-10-CM | POA: Diagnosis not present

## 2019-03-10 NOTE — Therapy (Addendum)
[facility] [address]  [place], [place], [address] Phone: [phone]   Fax:  [phone]  Physical Therapy Treatment  Patient Details  Name: Brittany Salinas MRN: [id number] Date of Birth: 1950/11/27 Referring Provider (PT): Penni Homans, MD   Encounter Date: 03/10/2019  PT End of Session - 03/10/19 1132    Visit Number  8    Number of Visits  13    Date for PT Re-Evaluation  03/10/19    Authorization Type  [employer]    PT Start Time  0936    PT Stop Time  1014    PT Time Calculation (min)  38 min    Equipment Utilized During Treatment  Gait belt    Activity Tolerance  Patient tolerated treatment well    Behavior During Therapy  [facility] for tasks assessed/performed       Past Medical History:  Diagnosis Date  . Arthritis    OA  . Depression   . Hyperlipidemia   . Insomnia   . Memory loss   . Right foot pain 10/09/2017  . Vision abnormalities     Past Surgical History:  Procedure Laterality Date  . BREAST BIOPSY Right    benign  . BREAST REDUCTION SURGERY  2007  . CESAREAN SECTION     4 c-sections  . REDUCTION MAMMAPLASTY Bilateral   . ROTATOR CUFF REPAIR Bilateral    right in 2010, left in 2017.  . TONSILLECTOMY      There were no vitals filed for this visit.  Subjective Assessment - 03/10/19 0936    Subjective  Reports that she has been doing okay- about the same. Reports 70% improvement in dizziness since intiial eval. Feels that she still struggles with balance and being able to walk in a straight line. No dizzinress currently.    Pertinent History  vision abnormalities, R foot pain, memory loss, HLD, depression, B RTC repair    Patient Stated Goals  feel balanced again         [facility] PT Assessment - 03/10/19 0001      Static Standing Balance   Static Standing Balance -  Activities   Romberg - Eyes Opened;Romberg - Eyes Closed;Romberg - Eyes Opened, Foam;Romberg - Eyes Closed , Foam     Static Standing - Comment/# of Minutes  M-CTSIB condition EC/foam mild-moderate sway      Dynamic Gait Index   Level Surface  Normal    Change in Gait Speed  Normal    Gait with Horizontal Head Turns  Normal    Gait with Vertical Head Turns  Normal    Gait and Pivot Turn  Moderate Impairment    Step Over Obstacle  Normal    Step Around Obstacles  Mild Impairment    Steps  Normal    Total Score  21         Treatment:  standing vertical VOR x10- 0/10 dizziness  standing horizontal VOR x10- 2/10 dizziness  gait training with CGA with instruction to follow vertical lines for improvement in equilibrium x152f  forward/backward stepping with up/down head turns to targets with 180 degree pivot turn after each rep x5 each side- cues to decrease speed and ensure steadiness before each rep         PT Education - 03/10/19 1110    Education Details  update to HEP; discussion on objective progress with PT and remaining limitations.    Person(s) Educated  Patient  Methods  Explanation;Demonstration;Tactile cues;Verbal cues;Handout    Comprehension  Verbalized understanding;Returned demonstration       PT Short Term Goals - 03/10/19 0935      PT SHORT TERM GOAL #1   Title  Patient to be independent with initial HEP.    Time  3    Period  Weeks    Status  Achieved    Target Date  02/17/19        PT Long Term Goals - 03/10/19 0935      PT LONG TERM GOAL #1   Title  Patient to be independent with advanced HEP.    Time  6    Period  Weeks    Status  Achieved      PT LONG TERM GOAL #2   Title  Patient to report 0/10 dizziness with standing horizontal and vertical VOR.    Time  6    Period  Weeks    Status  Partially Met   0/10 with vertical, 2/10 with horizontal     PT LONG TERM GOAL #3   Title  Patient to report 0/10 dizziness with bed mobility.    Time  6    Period  Weeks    Status  Achieved   reports no dizziness     PT LONG TERM GOAL #4   Title  Patient  to score >19/24 on DGI in order to decrease risk of falls.    Time  6    Period  Weeks    Status  Achieved   20/24     PT LONG TERM GOAL #5   Title  Patient to demonstrate mild sway with M-CTSIB condition EC/foam surface in order to improve ability to manuever in the dark and on unstable surfaces.    Time  6    Period  Weeks    Status  Partially Met   demonstrated mild-moderate sway           Plan - 03/10/19 1152    Clinical Impression Statement  Patient reporting 70% improvement in dizziness since initial eval. Feels that she still struggles with balance and being able to walk in a straight line. Patient has been compliant with HEP and notes that she feels that her exercises are still appropriately challenging for her. Notes that she no longer has dizziness with bed mobility. Reporting 0/10 dizziness with vertical VOR, but with 2/10 dizziness remaining with horizontal VOR. Patient increased her score on DGI today, scoring 21/24 which indicates a decreased risk of falls. Patient did markedly have trouble with the turning component of this test and also reports continued trouble with maintaining a straight line when walking. Thus, worked on gait training with use of vertical lines for improvement in awareness of midline and standing turning activities. Patient reported good benefit from these exercises, thus updated them into HEP. Patient has demonstrated good progress with dizziness with remaining intermittent imbalance. Believe patient will continue to progress with continued compliance with updated HEP. Patient in agreement. Placing patient on 30 day hold at this time.    Comorbidities  vision abnormalities, R foot pain, memory loss, HLD, depression, B RTC repair    PT Treatment/Interventions  ADLs/Self Care Home Management;Canalith Repostioning;Moist Heat;Cryotherapy;Balance training;Therapeutic exercise;Therapeutic activities;Functional mobility training;Stair training;Gait training;DME  Instruction;Neuromuscular re-education;Patient/family education;Vestibular    PT Next Visit Plan  30 day hold at this time    Consulted and Agree with Plan of Care  Patient       Patient  will benefit from skilled therapeutic intervention in order to improve the following deficits and impairments:  Decreased activity tolerance, Decreased balance, Difficulty walking, Dizziness  Visit Diagnosis: Dizziness and giddiness  Unsteadiness on feet     Problem List Patient Active Problem List   Diagnosis Date Noted  . Vertigo 01/29/2019  . Dyspareunia in female 11/16/2018  . Preventative health care 04/13/2018  . Cervical cancer screening 04/13/2018  . Obesity 04/13/2018  . Vaginitis 04/13/2018  . Hyperlipidemia 10/09/2017  . Right foot pain 10/09/2017  . Colon polyp 10/09/2017  . Hypertension 07/08/2017  . Left knee pain 05/13/2017  . Memory loss 04/18/2016  . Insomnia 04/18/2016  . Depression with anxiety 04/18/2016  . Gait disturbance 04/18/2016     Janene Harvey, PT, DPT 03/10/19 11:57 AM   [facility] [place], [place], [address] Phone: [phone]   Fax:  [phone]  Name: Brittany Salinas MRN: [id number] Date of Birth: 04-26-1951  PHYSICAL THERAPY DISCHARGE SUMMARY  Visits from Start of Care: 8  Current functional level related to goals / functional outcomes: See above clinical impression    Remaining deficits: Dizziness with horizontal head turns, imbalance on compliant surface   Education / Equipment: HEP  Plan: Patient agrees to discharge.  Patient goals were partially met. Patient is being discharged due to meeting the stated rehab goals.  ?????     Janene Harvey, PT, DPT 05/19/19 9:38 AM

## 2019-03-10 NOTE — Patient Instructions (Signed)
Standing on couch cushion with EC and feet together 3x30" at counter top R/L forward/backward stepping with head turns to targets up/down and 180 degree pivot turn after each rep Walking with gaze fixed on vertical lines

## 2019-03-11 ENCOUNTER — Encounter: Payer: PPO | Admitting: Physical Therapy

## 2019-04-19 DIAGNOSIS — Z20828 Contact with and (suspected) exposure to other viral communicable diseases: Secondary | ICD-10-CM | POA: Diagnosis not present

## 2019-05-17 ENCOUNTER — Other Ambulatory Visit: Payer: Self-pay | Admitting: Family Medicine

## 2019-05-17 MED ORDER — MECLIZINE HCL 12.5 MG PO TABS: 12.5000 mg | ORAL_TABLET | Freq: Three times a day (TID) | ORAL | 0 refills | Status: AC | PRN

## 2019-06-24 ENCOUNTER — Other Ambulatory Visit: Payer: Self-pay | Admitting: Family Medicine

## 2019-06-26 ENCOUNTER — Ambulatory Visit: Payer: PPO | Attending: Internal Medicine

## 2019-06-26 DIAGNOSIS — Z23 Encounter for immunization: Secondary | ICD-10-CM | POA: Insufficient documentation

## 2019-06-26 NOTE — Progress Notes (Signed)
   Covid-19 Vaccination Clinic  Name:  Brittany Salinas    MRN: [id number] DOB: Oct 13, 1950  06/26/2019  [name] was observed post Covid-19 immunization for 15 minutes without incidence. She was provided with Vaccine Information Sheet and instruction to access the V-Safe system.   [name] was instructed to call 911 with any severe reactions post vaccine: Marland Kitchen Difficulty breathing  . Swelling of your face and throat  . A fast heartbeat  . A bad rash all over your body  . Dizziness and weakness    Immunizations Administered    Name Date Dose VIS Date Route   Pfizer COVID-19 Vaccine 06/26/2019  9:03 AM 0.3 mL 04/16/2019 Intramuscular   Manufacturer: [place]   Lot: [id number]   [place]: [id number]

## 2019-07-19 ENCOUNTER — Ambulatory Visit: Payer: PPO | Attending: Internal Medicine

## 2019-07-19 DIAGNOSIS — Z23 Encounter for immunization: Secondary | ICD-10-CM

## 2019-07-19 NOTE — Progress Notes (Signed)
   Covid-19 Vaccination Clinic  Name:  Brittany Salinas    MRN: [id number] DOB: [birth year]  07/19/2019  [name] was observed post Covid-19 immunization for 15 minutes without incident. She was provided with Vaccine Information Sheet and instruction to access the V-Safe system.   [name] was instructed to call 911 with any severe reactions post vaccine: Marland Kitchen Difficulty breathing  . Swelling of face and throat  . A fast heartbeat  . A bad rash all over body  . Dizziness and weakness   Immunizations Administered    Name Date Dose VIS Date Route   Pfizer COVID-19 Vaccine 07/19/2019 10:02 AM 0.3 mL 04/16/2019 Intramuscular   Manufacturer: [place]   Lot: [id number]   [place]: [id number]

## 2019-07-20 ENCOUNTER — Ambulatory Visit: Payer: Self-pay

## 2019-08-15 ENCOUNTER — Other Ambulatory Visit: Payer: Self-pay | Admitting: Family Medicine

## 2019-10-15 DIAGNOSIS — H524 Presbyopia: Secondary | ICD-10-CM | POA: Diagnosis not present

## 2019-10-15 DIAGNOSIS — H2513 Age-related nuclear cataract, bilateral: Secondary | ICD-10-CM | POA: Diagnosis not present

## 2019-10-20 ENCOUNTER — Other Ambulatory Visit: Payer: Self-pay | Admitting: *Deleted

## 2019-10-20 MED ORDER — SERTRALINE HCL 50 MG PO TABS: 50.0000 mg | ORAL_TABLET | Freq: Every day | ORAL | 0 refills | Status: DC

## 2019-12-28 ENCOUNTER — Other Ambulatory Visit: Payer: Self-pay | Admitting: Family Medicine

## 2020-01-12 ENCOUNTER — Other Ambulatory Visit: Payer: Self-pay | Admitting: Family Medicine

## 2020-01-12 DIAGNOSIS — H2512 Age-related nuclear cataract, left eye: Secondary | ICD-10-CM | POA: Diagnosis not present

## 2020-01-12 DIAGNOSIS — H25812 Combined forms of age-related cataract, left eye: Secondary | ICD-10-CM | POA: Diagnosis not present

## 2020-01-28 ENCOUNTER — Telehealth: Payer: Self-pay | Admitting: Family Medicine

## 2020-01-28 DIAGNOSIS — N390 Urinary tract infection, site not specified: Secondary | ICD-10-CM | POA: Diagnosis not present

## 2020-01-28 NOTE — Telephone Encounter (Signed)
Error

## 2020-02-03 DIAGNOSIS — W010XXA Fall on same level from slipping, tripping and stumbling without subsequent striking against object, initial encounter: Secondary | ICD-10-CM | POA: Diagnosis not present

## 2020-02-03 DIAGNOSIS — I10 Essential (primary) hypertension: Secondary | ICD-10-CM | POA: Diagnosis not present

## 2020-02-03 DIAGNOSIS — Z7982 Long term (current) use of aspirin: Secondary | ICD-10-CM | POA: Diagnosis not present

## 2020-02-03 DIAGNOSIS — S42402A Unspecified fracture of lower end of left humerus, initial encounter for closed fracture: Secondary | ICD-10-CM | POA: Diagnosis not present

## 2020-02-07 ENCOUNTER — Other Ambulatory Visit: Payer: Self-pay

## 2020-02-07 ENCOUNTER — Other Ambulatory Visit: Payer: Self-pay | Admitting: Orthopedic Surgery

## 2020-02-07 ENCOUNTER — Ambulatory Visit
Admission: RE | Admit: 2020-02-07 | Discharge: 2020-02-07 | Disposition: A | Discharge status: Home / Self Care | Payer: PPO | Source: Ambulatory Visit | Attending: Orthopedic Surgery | Admitting: Orthopedic Surgery

## 2020-02-07 DIAGNOSIS — M25522 Pain in left elbow: Secondary | ICD-10-CM

## 2020-02-07 DIAGNOSIS — S42492A Other displaced fracture of lower end of left humerus, initial encounter for closed fracture: Secondary | ICD-10-CM | POA: Diagnosis not present

## 2020-02-07 DIAGNOSIS — M7989 Other specified soft tissue disorders: Secondary | ICD-10-CM | POA: Diagnosis not present

## 2020-02-07 DIAGNOSIS — S42402A Unspecified fracture of lower end of left humerus, initial encounter for closed fracture: Secondary | ICD-10-CM | POA: Diagnosis not present

## 2020-02-09 ENCOUNTER — Other Ambulatory Visit (HOSPITAL_COMMUNITY)
Admission: RE | Admit: 2020-02-09 | Discharge: 2020-02-09 | Disposition: A | Discharge status: Home / Self Care | Payer: PPO | Source: Ambulatory Visit | Attending: Orthopedic Surgery | Admitting: Orthopedic Surgery

## 2020-02-09 DIAGNOSIS — Z01812 Encounter for preprocedural laboratory examination: Secondary | ICD-10-CM | POA: Insufficient documentation

## 2020-02-09 DIAGNOSIS — Z20822 Contact with and (suspected) exposure to covid-19: Secondary | ICD-10-CM | POA: Diagnosis not present

## 2020-02-09 LAB — SARS CORONAVIRUS 2 (TAT 6-24 HRS): SARS Coronavirus 2: NEGATIVE

## 2020-02-10 ENCOUNTER — Encounter (HOSPITAL_COMMUNITY): Payer: Self-pay | Admitting: Orthopedic Surgery

## 2020-02-11 ENCOUNTER — Other Ambulatory Visit: Payer: Self-pay | Admitting: Family Medicine

## 2020-02-11 DIAGNOSIS — E785 Hyperlipidemia, unspecified: Secondary | ICD-10-CM | POA: Diagnosis not present

## 2020-02-11 DIAGNOSIS — Z885 Allergy status to narcotic agent status: Secondary | ICD-10-CM | POA: Diagnosis not present

## 2020-02-11 DIAGNOSIS — S42472A Displaced transcondylar fracture of left humerus, initial encounter for closed fracture: Secondary | ICD-10-CM | POA: Diagnosis not present

## 2020-02-11 DIAGNOSIS — Z8249 Family history of ischemic heart disease and other diseases of the circulatory system: Secondary | ICD-10-CM | POA: Diagnosis not present

## 2020-02-11 DIAGNOSIS — G4700 Insomnia, unspecified: Secondary | ICD-10-CM | POA: Diagnosis not present

## 2020-02-11 DIAGNOSIS — Z20822 Contact with and (suspected) exposure to covid-19: Secondary | ICD-10-CM | POA: Diagnosis present

## 2020-02-11 DIAGNOSIS — S42492A Other displaced fracture of lower end of left humerus, initial encounter for closed fracture: Secondary | ICD-10-CM | POA: Diagnosis present

## 2020-02-11 DIAGNOSIS — S42402A Unspecified fracture of lower end of left humerus, initial encounter for closed fracture: Secondary | ICD-10-CM | POA: Diagnosis present

## 2020-02-11 DIAGNOSIS — Z823 Family history of stroke: Secondary | ICD-10-CM | POA: Diagnosis not present

## 2020-02-11 DIAGNOSIS — Z83438 Family history of other disorder of lipoprotein metabolism and other lipidemia: Secondary | ICD-10-CM | POA: Diagnosis not present

## 2020-02-11 DIAGNOSIS — I10 Essential (primary) hypertension: Secondary | ICD-10-CM | POA: Diagnosis present

## 2020-02-11 DIAGNOSIS — Z803 Family history of malignant neoplasm of breast: Secondary | ICD-10-CM | POA: Diagnosis not present

## 2020-02-11 DIAGNOSIS — W19XXXA Unspecified fall, initial encounter: Secondary | ICD-10-CM | POA: Diagnosis present

## 2020-02-11 NOTE — Anesthesia Preprocedure Evaluation (Addendum)
Anesthesia Evaluation  Patient identified by MRN, date of birth, ID band Patient awake    Reviewed: Allergy & Precautions, NPO status , Patient's Chart, lab work & pertinent test results, reviewed documented beta blocker date and time   History of Anesthesia Complications Negative for: history of anesthetic complications  Airway Mallampati: II  TM Distance: >3 FB Neck ROM: Full    Dental  (+) Dental Advisory Given, Chipped   Pulmonary neg pulmonary ROS,    Pulmonary exam normal        Cardiovascular hypertension, Pt. on home beta blockers and Pt. on medications Normal cardiovascular exam     Neuro/Psych PSYCHIATRIC DISORDERS Anxiety Depression negative neurological ROS     GI/Hepatic negative GI ROS, Neg liver ROS,   Endo/Other   Obesity   Renal/GU negative Renal ROS     Musculoskeletal  (+) Arthritis , Osteoarthritis,    Abdominal   Peds  Hematology negative hematology ROS (+)   Anesthesia Other Findings Covid test negative   Reproductive/Obstetrics                            Anesthesia Physical Anesthesia Plan  ASA: II  Anesthesia Plan: General   Post-op Pain Management:    Induction: Intravenous  PONV Risk Score and Plan: 3 and Treatment may vary due to age or medical condition, Ondansetron, Dexamethasone and Midazolam  Airway Management Planned: LMA  Additional Equipment: None  Intra-op Plan:   Post-operative Plan: Extubation in OR  Informed Consent: I have reviewed the patients History and Physical, chart, labs and discussed the procedure including the risks, benefits and alternatives for the proposed anesthesia with the patient or authorized representative who has indicated his/her understanding and acceptance.     Dental advisory given  Plan Discussed with: CRNA, Anesthesiologist and Surgeon  Anesthesia Plan Comments: (Surgeon requests no block given concerns  for ulnar nerve. Consented patient for possible postop block as indicated by pain control.)      Anesthesia Quick Evaluation

## 2020-02-11 NOTE — Pre-Procedure Instructions (Signed)
Brittany Salinas  02/11/2020     Your procedure is scheduled on Saturday, October 9th.  Report to  The Emergency Department registration desk 6:00 A.M.                .Your surgery or procedure is scheduled to begin at 7:30 AM   Call this number if you have problems the morning of surgery: [phone]  This is the number for the Pre- Surgical Desk.    Remember:  Do not eat  after midnight.  You may drink clear liquids until 4:30 AM.   Clear liquids allowed are:                     Water, Juice (non-citric and without pulp - diabetics please choose diet or no sugar options), Carbonated beverages - (diabetics please choose diet or no sugar options), Clear Tea, Black Coffee only (no creamer, milk or cream including half and half), Plain Jell-O only (diabetics please choose diet or no sugar options), Gatorade (diabetics please choose diet or no sugar options) and Plain Popsicles only   Drink Pre- Surgery  Saturday morning before 4:30 AM   Nothing to drink after 4:30 AM  Take these medicines the morning of surgery with A SIP OF WATER:  buPROPion (WELLBUTRIN XL)             sertraline (ZOLOFT)  Take if needed: meclizine (ANTIVERT)  methocarbamol (ROBAXIN)  oxyCODONE (OXY IR/ROXICODON)   STOP taking Aspirin, Aspirin Products (Goody Powder, Excedrin Migraine), Ibuprofen (Advil), Naproxen (Aleve), Vitamins and Herbal Products (ie Fish Oil).    Special instructions:    [facility]- Preparing For Surgery  Before surgery, you can play an important role. Because skin is not sterile, your skin needs to be as free of germs as possible. You can reduce the number of germs on your skin by washing with CHG (chlorahexidine gluconate) Soap before surgery.  CHG is an antiseptic cleaner which kills germs and bonds with the skin to continue killing germs even after washing.    Oral Hygiene is also important to reduce your risk of infection.  Remember - BRUSH YOUR TEETH THE MORNING OF SURGERY WITH YOUR  REGULAR TOOTHPASTE  Please do not use if you have an allergy to CHG or antibacterial soaps. If your skin becomes reddened/irritated stop using the CHG.  Do not shave (including legs and underarms) for at least 48 hours prior to first CHG shower. It is OK to shave your face.  Please follow these instructions carefully.   1. Shower the NIGHT BEFORE SURGERY and the MORNING OF SURGERY with CHG.   2. If you chose to wash your hair, wash your hair first as usual with your normal shampoo.  3. After you shampoo, wash your face and private area with the soap you use at home, then rinse your hair and body thoroughly to remove the shampoo and soap.  4. Use CHG as you would any other liquid soap. You can apply CHG directly to the skin and wash gently with a scrungie or a clean washcloth.   5. Apply the CHG Soap to your body ONLY FROM THE NECK DOWN.  Do not use on open wounds or open sores. Avoid contact with your eyes, ears, mouth and genitals (private parts).   6. Wash thoroughly, paying special attention to the area where your surgery will be performed.  7. Thoroughly rinse your body with warm water from the neck down.  8. DO  NOT shower/wash with your normal soap after using and rinsing off the CHG Soap.  9. Pat yourself dry with a CLEAN TOWEL.  10. Wear CLEAN PAJAMAS to bed the night before surgery, wear comfortable clothes the morning of surgery  11. Place CLEAN SHEETS on your bed the night of your first shower and DO NOT SLEEP WITH PETS.  Day of Surgery: Shower as instructed above. Do not apply any deodorants/lotions, powders or colognes.  Please wear clean clothes to the hospital/surgery center.   Remember to brush your teeth WITH YOUR REGULAR TOOTHPASTE.  Do not wear jewelry, make-up or nail polish.  Do not shave 48 hours prior to surgery.  Men may shave face and neck.  Do not bring valuables to the hospital.  [facility] is not responsible for any belongings or  valuables.  Contacts, dentures or bridgework may not be worn into surgery.  Leave your suitcase in the car.  After surgery it may be brought to your room.  For patients admitted to the hospital, discharge time will be determined by your treatment team.  Patients discharged the day of surgery will not be allowed to drive home.   Please read over the fact sheets that you were given.

## 2020-02-12 ENCOUNTER — Encounter (HOSPITAL_COMMUNITY): Payer: Self-pay | Admitting: Orthopedic Surgery

## 2020-02-12 ENCOUNTER — Ambulatory Visit (HOSPITAL_COMMUNITY): Payer: PPO

## 2020-02-12 ENCOUNTER — Encounter (HOSPITAL_COMMUNITY)
Admission: RE | Disposition: A | Discharge status: Home / Self Care | Payer: Self-pay | Source: Ambulatory Visit | Attending: Orthopedic Surgery

## 2020-02-12 ENCOUNTER — Other Ambulatory Visit: Payer: Self-pay

## 2020-02-12 ENCOUNTER — Ambulatory Visit (HOSPITAL_COMMUNITY): Payer: PPO | Admitting: Anesthesiology

## 2020-02-12 ENCOUNTER — Inpatient Hospital Stay (HOSPITAL_COMMUNITY)
Admission: RE | Admit: 2020-02-12 | Discharge: 2020-02-14 | DRG: 494 | Disposition: A | Discharge status: Home / Self Care | Payer: PPO | Source: Ambulatory Visit | Attending: Orthopedic Surgery | Admitting: Orthopedic Surgery

## 2020-02-12 DIAGNOSIS — G4700 Insomnia, unspecified: Secondary | ICD-10-CM | POA: Diagnosis not present

## 2020-02-12 DIAGNOSIS — I10 Essential (primary) hypertension: Secondary | ICD-10-CM | POA: Diagnosis present

## 2020-02-12 DIAGNOSIS — Z20822 Contact with and (suspected) exposure to covid-19: Secondary | ICD-10-CM | POA: Diagnosis present

## 2020-02-12 DIAGNOSIS — S42402A Unspecified fracture of lower end of left humerus, initial encounter for closed fracture: Secondary | ICD-10-CM | POA: Diagnosis present

## 2020-02-12 DIAGNOSIS — Z885 Allergy status to narcotic agent status: Secondary | ICD-10-CM

## 2020-02-12 DIAGNOSIS — S42492A Other displaced fracture of lower end of left humerus, initial encounter for closed fracture: Principal | ICD-10-CM | POA: Diagnosis present

## 2020-02-12 DIAGNOSIS — Z803 Family history of malignant neoplasm of breast: Secondary | ICD-10-CM

## 2020-02-12 DIAGNOSIS — E785 Hyperlipidemia, unspecified: Secondary | ICD-10-CM | POA: Diagnosis not present

## 2020-02-12 DIAGNOSIS — Z83438 Family history of other disorder of lipoprotein metabolism and other lipidemia: Secondary | ICD-10-CM

## 2020-02-12 DIAGNOSIS — Z823 Family history of stroke: Secondary | ICD-10-CM

## 2020-02-12 DIAGNOSIS — Z8249 Family history of ischemic heart disease and other diseases of the circulatory system: Secondary | ICD-10-CM

## 2020-02-12 DIAGNOSIS — W19XXXA Unspecified fall, initial encounter: Secondary | ICD-10-CM | POA: Diagnosis present

## 2020-02-12 DIAGNOSIS — S42472A Displaced transcondylar fracture of left humerus, initial encounter for closed fracture: Secondary | ICD-10-CM | POA: Diagnosis not present

## 2020-02-12 HISTORY — DX: Family history of other specified conditions: Z84.89

## 2020-02-12 HISTORY — DX: Essential (primary) hypertension: I10

## 2020-02-12 HISTORY — PX: ORIF ELBOW FRACTURE: SHX5031

## 2020-02-12 HISTORY — DX: Unspecified fracture of lower end of left humerus, initial encounter for closed fracture: S42.402A

## 2020-02-12 HISTORY — DX: Unspecified cataract: H26.9

## 2020-02-12 LAB — BASIC METABOLIC PANEL
Anion gap: 11 (ref 5–15)
BUN: 12 mg/dL (ref 8–23)
CO2: 22 mmol/L (ref 22–32)
Calcium: 8.9 mg/dL (ref 8.9–10.3)
Chloride: 104 mmol/L (ref 98–111)
Creatinine, Ser: 0.76 mg/dL (ref 0.44–1.00)
GFR, Estimated: >60 mL/min (ref >60)
Glucose, Bld: 93 mg/dL (ref 70–99)
Potassium: 4.6 mmol/L (ref 3.5–5.1)
Sodium: 137 mmol/L (ref 135–145)

## 2020-02-12 LAB — CBC
HCT: 35.9 % — ABNORMAL LOW (ref 36.0–46.0)
Hemoglobin: 11.5 g/dL — ABNORMAL LOW (ref 12.0–15.0)
MCH: 30 pg (ref 26.0–34.0)
MCHC: 32 g/dL (ref 30.0–36.0)
MCV: 93.7 fL (ref 80.0–100.0)
Platelets: 317 10*3/uL (ref 150–400)
RBC: 3.83 MIL/uL — ABNORMAL LOW (ref 3.87–5.11)
RDW: 11.9 % (ref 11.5–15.5)
WBC: 6.3 10*3/uL (ref 4.0–10.5)
nRBC: 0 % (ref 0.0–0.2)

## 2020-02-12 SURGERY — OPEN REDUCTION INTERNAL FIXATION (ORIF) ELBOW/OLECRANON FRACTURE
Anesthesia: General | Site: Elbow | Laterality: Left

## 2020-02-12 MED ORDER — FENTANYL CITRATE (PF) 100 MCG/2ML IJ SOLN
25.0000 ug | INTRAMUSCULAR | Status: DC | PRN
  Administered 2020-02-12 (×4): 25 ug via INTRAVENOUS

## 2020-02-12 MED ORDER — DEXAMETHASONE SODIUM PHOSPHATE 4 MG/ML IJ SOLN
INTRAMUSCULAR | Status: DC | PRN
  Administered 2020-02-12: 5 mg via INTRAVENOUS

## 2020-02-12 MED ORDER — HYDROMORPHONE HCL 1 MG/ML IJ SOLN
INTRAMUSCULAR | Status: AC
Start: 2020-02-12 — End: 2020-02-13
  Filled 2020-02-12: qty 1

## 2020-02-12 MED ORDER — ONDANSETRON HCL 4 MG/2ML IJ SOLN
INTRAMUSCULAR | Status: AC
  Filled 2020-02-12: qty 2

## 2020-02-12 MED ORDER — ASCORBIC ACID 500 MG PO TABS: 1000.0000 mg | ORAL_TABLET | Freq: Every day | ORAL | Status: DC

## 2020-02-12 MED ORDER — BUPROPION HCL ER (XL) 300 MG PO TB24
300.0000 mg | ORAL_TABLET | Freq: Every day | ORAL | Status: DC
  Filled 2020-02-12: qty 1

## 2020-02-12 MED ORDER — DOCUSATE SODIUM 100 MG PO CAPS
100.0000 mg | ORAL_CAPSULE | Freq: Two times a day (BID) | ORAL | Status: DC
  Administered 2020-02-12 – 2020-02-14 (×4): 100 mg via ORAL
  Filled 2020-02-12 (×4): qty 1

## 2020-02-12 MED ORDER — FENTANYL CITRATE (PF) 100 MCG/2ML IJ SOLN
INTRAMUSCULAR | Status: AC
  Filled 2020-02-12: qty 2

## 2020-02-12 MED ORDER — CHLORHEXIDINE GLUCONATE 0.12 % MT SOLN
OROMUCOSAL | Status: AC
  Administered 2020-02-12: 15 mL via OROMUCOSAL
  Filled 2020-02-12: qty 15

## 2020-02-12 MED ORDER — METHOCARBAMOL 500 MG PO TABS
ORAL_TABLET | ORAL | Status: AC
  Filled 2020-02-12: qty 1

## 2020-02-12 MED ORDER — METHOCARBAMOL 1000 MG/10ML IJ SOLN
500.0000 mg | Freq: Four times a day (QID) | INTRAVENOUS | Status: DC | PRN
  Filled 2020-02-12: qty 5

## 2020-02-12 MED ORDER — PROPOFOL 10 MG/ML IV BOLUS
INTRAVENOUS | Status: AC
  Filled 2020-02-12: qty 40

## 2020-02-12 MED ORDER — ACETAMINOPHEN 500 MG PO TABS
ORAL_TABLET | ORAL | Status: AC
  Filled 2020-02-12: qty 2

## 2020-02-12 MED ORDER — HYDROMORPHONE HCL 1 MG/ML IJ SOLN
0.5000 mg | INTRAMUSCULAR | Status: DC | PRN
  Administered 2020-02-12: 1 mg via INTRAVENOUS
  Filled 2020-02-12: qty 1

## 2020-02-12 MED ORDER — FAMOTIDINE 20 MG PO TABS: 20.0000 mg | ORAL_TABLET | Freq: Two times a day (BID) | ORAL | Status: DC | PRN

## 2020-02-12 MED ORDER — LACTATED RINGERS IV SOLN: INTRAVENOUS | Status: DC

## 2020-02-12 MED ORDER — OXYCODONE HCL 5 MG PO TABS
ORAL_TABLET | ORAL | Status: AC
  Filled 2020-02-12: qty 2

## 2020-02-12 MED ORDER — ASCORBIC ACID 500 MG PO TABS
1000.0000 mg | ORAL_TABLET | Freq: Every day | ORAL | Status: DC
  Administered 2020-02-12 – 2020-02-14 (×3): 1000 mg via ORAL
  Filled 2020-02-12 (×3): qty 2

## 2020-02-12 MED ORDER — MIDAZOLAM HCL 5 MG/5ML IJ SOLN
INTRAMUSCULAR | Status: DC | PRN
  Administered 2020-02-12: 2 mg via INTRAVENOUS

## 2020-02-12 MED ORDER — POTASSIUM CHLORIDE 2 MEQ/ML IV SOLN
INTRAVENOUS | Status: DC
  Filled 2020-02-12 (×2): qty 1000

## 2020-02-12 MED ORDER — CEFAZOLIN SODIUM-DEXTROSE 2-4 GM/100ML-% IV SOLN
INTRAVENOUS | Status: AC
  Filled 2020-02-12: qty 100

## 2020-02-12 MED ORDER — HYDROMORPHONE HCL 1 MG/ML IJ SOLN
0.2500 mg | INTRAMUSCULAR | Status: DC | PRN
  Administered 2020-02-12: 0.5 mg via INTRAVENOUS

## 2020-02-12 MED ORDER — ONDANSETRON HCL 4 MG/2ML IJ SOLN: 4.0000 mg | Freq: Four times a day (QID) | INTRAMUSCULAR | Status: DC | PRN

## 2020-02-12 MED ORDER — METHOCARBAMOL 500 MG PO TABS: 500.0000 mg | ORAL_TABLET | Freq: Four times a day (QID) | ORAL | Status: DC | PRN

## 2020-02-12 MED ORDER — PROPOFOL 10 MG/ML IV BOLUS
INTRAVENOUS | Status: DC | PRN
  Administered 2020-02-12: 200 mg via INTRAVENOUS

## 2020-02-12 MED ORDER — ACETAMINOPHEN 325 MG PO TABS: 325.0000 mg | ORAL_TABLET | Freq: Four times a day (QID) | ORAL | Status: DC | PRN

## 2020-02-12 MED ORDER — METOPROLOL SUCCINATE ER 50 MG PO TB24
50.0000 mg | ORAL_TABLET | Freq: Every day | ORAL | Status: DC
  Administered 2020-02-12: 50 mg via ORAL
  Filled 2020-02-12: qty 1

## 2020-02-12 MED ORDER — ACETAMINOPHEN 500 MG PO TABS
1000.0000 mg | ORAL_TABLET | Freq: Four times a day (QID) | ORAL | Status: AC
  Administered 2020-02-12 – 2020-02-13 (×4): 1000 mg via ORAL
  Filled 2020-02-12 (×3): qty 2

## 2020-02-12 MED ORDER — FENTANYL CITRATE (PF) 250 MCG/5ML IJ SOLN
INTRAMUSCULAR | Status: AC
  Filled 2020-02-12: qty 5

## 2020-02-12 MED ORDER — ORAL CARE MOUTH RINSE: 15.0000 mL | Freq: Once | OROMUCOSAL | Status: AC

## 2020-02-12 MED ORDER — FENTANYL CITRATE (PF) 100 MCG/2ML IJ SOLN
INTRAMUSCULAR | Status: DC | PRN
  Administered 2020-02-12 (×2): 25 ug via INTRAVENOUS
  Administered 2020-02-12: 50 ug via INTRAVENOUS
  Administered 2020-02-12: 25 ug via INTRAVENOUS
  Administered 2020-02-12 (×2): 50 ug via INTRAVENOUS
  Administered 2020-02-12: 25 ug via INTRAVENOUS

## 2020-02-12 MED ORDER — SERTRALINE HCL 50 MG PO TABS
50.0000 mg | ORAL_TABLET | Freq: Every day | ORAL | Status: DC
  Administered 2020-02-13 – 2020-02-14 (×2): 50 mg via ORAL
  Filled 2020-02-12 (×3): qty 1

## 2020-02-12 MED ORDER — ONDANSETRON HCL 4 MG/2ML IJ SOLN
4.0000 mg | Freq: Once | INTRAMUSCULAR | Status: AC | PRN
  Administered 2020-02-12: 4 mg via INTRAVENOUS

## 2020-02-12 MED ORDER — LACTATED RINGERS IV SOLN: INTRAVENOUS | Status: DC | PRN

## 2020-02-12 MED ORDER — OXYCODONE HCL 5 MG PO TABS
10.0000 mg | ORAL_TABLET | ORAL | Status: DC | PRN
  Administered 2020-02-12: 15 mg via ORAL
  Administered 2020-02-13: 10 mg via ORAL
  Filled 2020-02-12: qty 2
  Filled 2020-02-12: qty 3

## 2020-02-12 MED ORDER — ALPRAZOLAM 0.5 MG PO TABS: 0.5000 mg | ORAL_TABLET | Freq: Four times a day (QID) | ORAL | Status: DC | PRN

## 2020-02-12 MED ORDER — CEFAZOLIN SODIUM-DEXTROSE 1-4 GM/50ML-% IV SOLN
1.0000 g | INTRAVENOUS | Status: AC
  Administered 2020-02-12: 1 g via INTRAVENOUS
  Filled 2020-02-12: qty 50

## 2020-02-12 MED ORDER — ONDANSETRON HCL 4 MG/2ML IJ SOLN
INTRAMUSCULAR | Status: DC | PRN
  Administered 2020-02-12: 4 mg via INTRAVENOUS

## 2020-02-12 MED ORDER — METHOCARBAMOL 500 MG PO TABS
500.0000 mg | ORAL_TABLET | Freq: Four times a day (QID) | ORAL | Status: DC | PRN
  Administered 2020-02-12 – 2020-02-13 (×3): 500 mg via ORAL
  Filled 2020-02-12 (×2): qty 1

## 2020-02-12 MED ORDER — OXYCODONE HCL 5 MG PO TABS
5.0000 mg | ORAL_TABLET | ORAL | Status: DC | PRN
  Administered 2020-02-12 – 2020-02-13 (×5): 10 mg via ORAL
  Filled 2020-02-12 (×4): qty 2

## 2020-02-12 MED ORDER — MIDAZOLAM HCL 2 MG/2ML IJ SOLN
INTRAMUSCULAR | Status: AC
  Filled 2020-02-12: qty 2

## 2020-02-12 MED ORDER — OXYCODONE HCL 5 MG PO TABS: 5.0000 mg | ORAL_TABLET | Freq: Once | ORAL | Status: DC | PRN

## 2020-02-12 MED ORDER — LIDOCAINE 2% (20 MG/ML) 5 ML SYRINGE
INTRAMUSCULAR | Status: DC | PRN
  Administered 2020-02-12: 60 mg via INTRAVENOUS

## 2020-02-12 MED ORDER — CEFAZOLIN SODIUM-DEXTROSE 2-4 GM/100ML-% IV SOLN: 2.0000 g | INTRAVENOUS | Status: DC

## 2020-02-12 MED ORDER — OXYCODONE HCL 5 MG/5ML PO SOLN: 5.0000 mg | Freq: Once | ORAL | Status: DC | PRN

## 2020-02-12 MED ORDER — CHLORHEXIDINE GLUCONATE 0.12 % MT SOLN: 15.0000 mL | Freq: Once | OROMUCOSAL | Status: AC

## 2020-02-12 MED ORDER — 0.9 % SODIUM CHLORIDE (POUR BTL) OPTIME
TOPICAL | Status: DC | PRN
  Administered 2020-02-12: 1000 mL

## 2020-02-12 MED ORDER — PROMETHAZINE HCL 12.5 MG RE SUPP
12.5000 mg | Freq: Four times a day (QID) | RECTAL | Status: DC | PRN
  Filled 2020-02-12: qty 1

## 2020-02-12 MED ORDER — ASPIRIN EC 81 MG PO TBEC
81.0000 mg | DELAYED_RELEASE_TABLET | Freq: Every day | ORAL | Status: DC
  Administered 2020-02-13 – 2020-02-14 (×2): 81 mg via ORAL
  Filled 2020-02-12 (×3): qty 1

## 2020-02-12 MED ORDER — ONDANSETRON HCL 4 MG PO TABS: 4.0000 mg | ORAL_TABLET | Freq: Four times a day (QID) | ORAL | Status: DC | PRN

## 2020-02-12 MED ORDER — CEFAZOLIN SODIUM-DEXTROSE 1-4 GM/50ML-% IV SOLN
1.0000 g | Freq: Three times a day (TID) | INTRAVENOUS | Status: DC
  Administered 2020-02-12 – 2020-02-13 (×4): 1 g via INTRAVENOUS
  Filled 2020-02-12 (×6): qty 50

## 2020-02-12 SURGICAL SUPPLY — 78 items
BIT DRILL 2.0 (BIT) ×1
BIT DRILL 2.0MM (BIT) ×1
BIT DRILL 2.5X2.75 QC CALB (BIT) ×3 IMPLANT
BIT DRILL 2XNS DISP SS SM FRAG (BIT) ×1 IMPLANT
BIT DRILL 4.8X200 CANN (BIT) ×3 IMPLANT
BIT DRILL CALIBRATED 2.7 (BIT) ×2 IMPLANT
BIT DRILL CALIBRATED 2.7MM (BIT) ×1
BIT DRL 2XNS DISP SS SM FRAG (BIT) ×1
BLADE AVERAGE 25MMX9MM (BLADE) ×1
BLADE AVERAGE 25X9 (BLADE) ×2 IMPLANT
BLADE OSCIL/SAGITTAL W/10 ST (BLADE) ×2 IMPLANT
BLADE OSCIL/SAGITTAL W/10MM ST (BLADE) ×1
BNDG COHESIVE 4X5 TAN STRL (GAUZE/BANDAGES/DRESSINGS) ×3 IMPLANT
BNDG ELASTIC 3X5.8 VLCR STR LF (GAUZE/BANDAGES/DRESSINGS) ×3 IMPLANT
BNDG ELASTIC 4X5.8 VLCR STR LF (GAUZE/BANDAGES/DRESSINGS) ×3 IMPLANT
BNDG ESMARK 4X9 LF (GAUZE/BANDAGES/DRESSINGS) ×3 IMPLANT
BNDG GAUZE ELAST 4 BULKY (GAUZE/BANDAGES/DRESSINGS) ×9 IMPLANT
CORD BIPOLAR FORCEPS 12FT (ELECTRODE) ×3 IMPLANT
COVER MAYO STAND STRL (DRAPES) ×3 IMPLANT
COVER SURGICAL LIGHT HANDLE (MISCELLANEOUS) ×3 IMPLANT
CUFF TOURN SGL QUICK 18X4 (TOURNIQUET CUFF) ×3 IMPLANT
CUFF TOURN SGL QUICK 24 (TOURNIQUET CUFF) ×2
CUFF TRNQT CYL 24X4X16.5-23 (TOURNIQUET CUFF) ×1 IMPLANT
DRAPE INCISE IOBAN 66X45 STRL (DRAPES) ×3 IMPLANT
DRAPE OEC MINIVIEW 54X84 (DRAPES) ×3 IMPLANT
DRSG ADAPTIC 3X8 NADH LF (GAUZE/BANDAGES/DRESSINGS) ×6 IMPLANT
EVACUATOR 1/8 PVC DRAIN (DRAIN) ×3 IMPLANT
GAUZE SPONGE 4X4 12PLY STRL (GAUZE/BANDAGES/DRESSINGS) ×3 IMPLANT
GAUZE XEROFORM 1X8 LF (GAUZE/BANDAGES/DRESSINGS) ×3 IMPLANT
GAUZE XEROFORM 5X9 LF (GAUZE/BANDAGES/DRESSINGS) ×6 IMPLANT
GLOVE BIOGEL M 8.0 STRL (GLOVE) ×3 IMPLANT
GLOVE SS BIOGEL STRL SZ 8 (GLOVE) ×1 IMPLANT
GLOVE SUPERSENSE BIOGEL SZ 8 (GLOVE) ×2
GOWN STRL REUS W/ TWL LRG LVL3 (GOWN DISPOSABLE) ×2 IMPLANT
GOWN STRL REUS W/ TWL XL LVL3 (GOWN DISPOSABLE) ×3 IMPLANT
GOWN STRL REUS W/TWL LRG LVL3 (GOWN DISPOSABLE) ×4
GOWN STRL REUS W/TWL XL LVL3 (GOWN DISPOSABLE) ×6
K-WIRE FIXATION 2.0X6 (WIRE) ×6
KIT BASIN OR (CUSTOM PROCEDURE TRAY) ×3 IMPLANT
KIT TURNOVER KIT B (KITS) ×3 IMPLANT
KWIRE FIXATION 2.0X6 (WIRE) ×2 IMPLANT
LOOP VESSEL MAXI BLUE (MISCELLANEOUS) ×3 IMPLANT
NEEDLE HYPO 25GX1X1/2 BEV (NEEDLE) IMPLANT
NS IRRIG 1000ML POUR BTL (IV SOLUTION) ×3 IMPLANT
PACK ORTHO EXTREMITY (CUSTOM PROCEDURE TRAY) ×3 IMPLANT
PAD ARMBOARD 7.5X6 YLW CONV (MISCELLANEOUS) ×6 IMPLANT
PAD CAST 4YDX4 CTTN HI CHSV (CAST SUPPLIES) ×2 IMPLANT
PADDING CAST COTTON 4X4 STRL (CAST SUPPLIES) ×4
PIN GUIDE DRILL TIP 2.8X300 (DRILL) ×3 IMPLANT
PLATE DIST HUM LAT LT SM (Plate) ×3 IMPLANT
PLATE DIST HUM MEDIAL LT SM (Plate) ×3 IMPLANT
SCREW CANN 6.5X85X40MM THRD (Screw) ×3 IMPLANT
SCREW CORT 3.5X26 (Screw) ×4 IMPLANT
SCREW CORT T15 24X3.5XST LCK (Screw) ×2 IMPLANT
SCREW CORT T15 26X3.5XST LCK (Screw) ×2 IMPLANT
SCREW CORT T15 28X3.5XST LCK (Screw) ×1 IMPLANT
SCREW CORTICAL 3.5X24MM (Screw) ×4 IMPLANT
SCREW CORTICAL 3.5X28MM (Screw) ×2 IMPLANT
SCREW CORTICAL 3.5X46MM (Screw) ×3 IMPLANT
SCREW LOCK 3.5X26 DIST TIB (Screw) ×3 IMPLANT
SCREW LOCK CORT STAR 3.5X10 (Screw) ×3 IMPLANT
SCREW LOCK CORT STAR 3.5X16 (Screw) ×3 IMPLANT
SCREW LOW PROFILE 22MMX3.5MM (Screw) ×3 IMPLANT
SOL PREP POV-IOD 4OZ 10% (MISCELLANEOUS) ×9 IMPLANT
SPECIMEN JAR SMALL (MISCELLANEOUS) ×3 IMPLANT
SPLINT FIBERGLASS 4X30 (CAST SUPPLIES) ×3 IMPLANT
SUT PROLENE 3 0 PS 2 (SUTURE) ×15 IMPLANT
SUT STEEL 7 (SUTURE) ×3 IMPLANT
SUT VIC AB 3-0 FS2 27 (SUTURE) ×6 IMPLANT
SYR CONTROL 10ML LL (SYRINGE) IMPLANT
TOWEL GREEN STERILE (TOWEL DISPOSABLE) ×6 IMPLANT
TOWEL GREEN STERILE FF (TOWEL DISPOSABLE) ×3 IMPLANT
TUBE CONNECTING 12'X1/4 (SUCTIONS) ×2
TUBE CONNECTING 12X1/4 (SUCTIONS) ×4 IMPLANT
UNDERPAD 30X36 HEAVY ABSORB (UNDERPADS AND DIAPERS) ×3 IMPLANT
WASHER 3.5MM (Orthopedic Implant) ×3 IMPLANT
WASHER FLAT 6.5MM (Washer) ×3 IMPLANT
WATER STERILE IRR 1000ML POUR (IV SOLUTION) ×3 IMPLANT

## 2020-02-12 NOTE — Transfer of Care (Signed)
Immediate Anesthesia Transfer of Care Note  Patient: Brittany Salinas  Procedure(s) Performed: Left elbow olecranon osteotomy, ulnar nerve release, and surgical reconstruction distal end of the humerus with repair (Left Elbow)  Patient Location: PACU  Anesthesia Type:General  Level of Consciousness: awake, alert  and oriented  Airway & Oxygen Therapy: Patient Spontanous Breathing and Patient connected to face mask oxygen  Post-op Assessment: Report given to RN and Post -op Vital signs reviewed and stable  Post vital signs: Reviewed and stable  Last Vitals:  Vitals Value Taken Time  BP 153/75 02/12/20 1100  Temp 36.3 C 02/12/20 1100  Pulse 91 02/12/20 1107  Resp 13 02/12/20 1107  SpO2 98 % 02/12/20 1107  Vitals shown include unvalidated device data.  Last Pain:  Vitals:   02/12/20 1100  TempSrc:   PainSc: 5       Patients Stated Pain Goal: 2 (32/12/24 8250)  Complications: No complications documented.

## 2020-02-12 NOTE — Op Note (Signed)
Operative note February 12, 2020  Roseanne Kaufman MD.  Preoperative diagnosis comminuted complex left distal humerus fracture intra-articular T condylar in nature.  Postop diagnosis comment: The same  Operative procedure #1 ulnar nerve decompression and anterior transposition with flexor pronator fascial release #2 olecranon osteotomy left elbow with subsequent repair utilizing tension band wire technique #3 open reduction internal fixation low T condylar intra-articular distal humerus fracture #4 5 view radiographic series with fluoroscopy performed examined and interpreted by myself  Surgeon Roseanne Kaufman graph   anesthesia General    tourniquet time less than 2 hours    drains 1     description of procedure this patient is a 69 year old female with comminuted complex distal humerus fracture left upper extremity.  She fell in [place] hand was transported to the [place] area where she resides.  We have counseled the patient in regards to risk benefits of surgery and the challenges with this unique fracture pattern.  With this in mind she desires to proceed.  Patient was taken to the operative theater and underwent smooth induction of general anesthesia SCD devices were placed and she was well-padded.  She was turned in a sloppy lateral position beanbag insufflated and axillary roll placed.  All body parts were well-padded.  I performed a Hibiclens scrub personally followed by 10-minute surgical Betadine scrub and paint.  Sterile field was secured outlined marks were visualized and Ioban was placed.  In the sloppy lateral position we insufflated a sterile tourniquet and made a utilitarian posterior incision after timeout was observed.  Skin flaps were elevated off of the fascia.  I was able to create a nice plane.  Following this we then very carefully and cautiously identified the ulnar nerve.  The arcade of Struthers medial intermuscular septum Osborne's ligament cubital tunnel and the  2 heads of the FCU both superficial and deep or released.  I then excised the medial intermuscular septum.  I then tucked the nerve well anteriorly and certainly away from the operative field.  I placed a vessel loop drain around it and kept it in mind at all times.  There is no iatrogenic injury and the nerve is very carefully dealt with.  Following this I placed a guidepin in the shaft of the ulna entering at the tip and coursing distally.  I marked this and checked this under x-ray following this I then made outlined marks for a chevron osteotomy for the olecranon.  Incised with the oscillating saw the near cortex and the far cortex was broken gently with an osteotome.  This allowed for peelback of the olecranon and triceps.  At this time the olecranon osteotomy was complete.  I then very carefully and cautiously identify the distal humerus with combination orthopedic device utilizing curette rondure and freer elevator I then piece together the comminuted low T condylar fragments.  Combination of Kirschner wires and clamps were used to hold provisional reduction followed by placement of medial and lateral plates.  I placed the plates parallel to make sure that had good fit and contours and that the fracture was reduced.  The spool and anterior humeral line look very good.  I was quite pleased with this.  Following this I then placed screws distally and proximally to engage the distal humerus.  There were no complicating features.  The patient had good secure fit.  Standard AO technique was used to apply the plates.  This was a Biomet plate and screw construct.  This was the Alps  elbow system.  Following this we irrigated copiously and noted excellent range of motion.  X-rays looked excellent.  Thus the ORIF comminuted complex intra-articular distal humerus fracture look quite well with the alignment of the articular surfaces looking quite nicely.  Following this the patient then underwent  placement of a 2 oh drill hole distal in the shaft of the ulna and an 18-gauge wire was then passed through this for tension band repair.  Following this a premeasured partially-threaded cancellous screw was placed in the olecranon osteotomy which was reduced.  The olecranon ostomy was reduced a tension band wire construct with 6.5 cannulated screw partially threaded was placed to allow for secure coaptation of cancellus surfaces under compression.  The interdigitation looked excellent and the motion was smooth.  Patient had 80% arc of motion without difficulty and I was quite pleased with this.  Following this we irrigated copiously with the tourniquet deflated once this was complete I then closed the fascia over the plates and performed a fascial release about the flexor pronator region so as to make sure the nerve was completely tension-free.  I set in the soft tissues and looked excellent.  It was nicely released.  Irrigation was applied in a aggressive fashion and following this the wound was closed over Hemovac drain noting that hemostasis was secure.  Thus the patient underwent ORIF distal humerus fracture left upper extremity with ulnar nerve decompression and olecranon osteotomy as well as 5 view radiographic series and fluoroscopy.  I was pleased with this and the findings.  Long-arm splint was placed after sterile bandage was applied.  Adaptic Xeroform 4 x 4's and our standard posterior splint algorithm was performed.  She was taken to recovery room and I discussed all issues with her family should be admitted for IV antibiotics pain management and other measures.  We will rehab her according to a standard well arm assisted motion protocol.  All questions have been encouraged and answered.  Is been absent pleasure to see you today.  Shannel Zahm MD

## 2020-02-12 NOTE — H&P (Signed)
Brittany Salinas is an 69 y.o. female.   Chief Complaint: Patient presents for left elbow reconstruction secondary to complex fracture subluxation HPI: Patient presents for reconstructive efforts left elbow secondary to fracture sustained in [place].  The patient has a significantly angulated and displaced intra-articular fracture about the distal humerus.  We are planning surgical intervention as outlined.  She denies neck back chest or abdominal pain at present time.  She complains of elbow pain as expected.  She notes no prior history of injury  Past Medical History:  Diagnosis Date  . Arthritis    OA  . Cataracts, both eyes   . Depression   . Family history of adverse reaction to anesthesia    mother - slow to awaken  . Hyperlipidemia   . Hypertension   . Insomnia   . Memory loss   . Right foot pain 10/09/2017  . Vision abnormalities     Past Surgical History:  Procedure Laterality Date  . BREAST BIOPSY Right    benign  . BREAST REDUCTION SURGERY  2007  . CESAREAN SECTION     4 c-sections  . COLONOSCOPY W/ POLYPECTOMY    . EYE SURGERY Left    catarct  . REDUCTION MAMMAPLASTY Bilateral   . ROTATOR CUFF REPAIR Bilateral    right in 2010, left in 2017.  . TONSILLECTOMY      Family History  Problem Relation Age of Onset  . Heart defect Mother        valvular heart disease  . Heart attack Father   . Hyperlipidemia Father   . Hypertension Father   . Breast cancer Maternal Aunt   . Marfan syndrome Sister   . Hypertension Brother   . Other Brother        vertigo  . Psoriasis Daughter   . Mental illness Son        depression alcohol in past  . Depression Son   . Cancer Maternal Grandmother        stomach  . Stroke Maternal Grandfather   . Marfan syndrome Maternal Grandfather        ?  Marland Kitchen Arrhythmia Son   . Alcohol abuse Son        h/o drug abuse   Social History:  reports that she has never smoked. She has never used smokeless tobacco. She reports current  alcohol use of about 2.0 standard drinks of alcohol per week. She reports that she does not use drugs.  Allergies:  Allergies  Allergen Reactions  . Codeine Nausea And Vomiting    No medications prior to admission.    No results found for this or any previous visit (from the past 48 hour(s)). No results found.  Review of Systems  Respiratory: Negative.   Cardiovascular: Negative.   Endocrine: Negative.   Genitourinary: Negative.     Height 5\' 4"  (1.626 m), weight 81.6 kg. Physical Exam  Patient has a comminuted complex fracture subluxation of the distal left humerus.  She has significant disarray of the soft tissue and pain appropriate to her fracture.  I reviewed her CT scan as well as fracture and other issues germane to her predicament.  We will plan to proceed with surgical reconstruction as outlined.  The patient is alert and oriented in no acute distress. The patient complains of pain in the affected upper extremity.  The patient is noted to have a normal HEENT exam. Lung fields show equal chest expansion and no shortness of breath. Abdomen  exam is nontender without distention. Lower extremity examination does not show any fracture dislocation or blood clot symptoms. Pelvis is stable and the neck and back are stable and nontender. Assessment/Plan We will plan to proceed with surgical reconstruction left elbow with ulnar nerve release, osteotomy, repair reconstruction left distal humerus and soft tissue reconstruction is necessary.  We are planning surgery for your upper extremity. The risk and benefits of surgery to include risk of bleeding, infection, anesthesia,  damage to normal structures and failure of the surgery to accomplish its intended goals of relieving symptoms and restoring function have been discussed in detail. With this in mind we plan to proceed. I have specifically discussed with the patient the pre-and postoperative regime and the dos and don'ts and risk  and benefits in great detail. Risk and benefits of surgery also include risk of dystrophy(CRPS), chronic nerve pain, failure of the healing process to go onto completion and other inherent risks of surgery The relavent the pathophysiology of the disease/injury process, as well as the alternatives for treatment and postoperative course of action has been discussed in great detail with the patient who desires to proceed.  We will do everything in our power to help you (the patient) restore function to the upper extremity. It is a pleasure to see this patient today.   Willa Frater III, MD 02/12/2020, 5:47 AM

## 2020-02-12 NOTE — Evaluation (Signed)
Physical Therapy Evaluation Patient Details Name: Brittany Salinas MRN: [id number] DOB: [birth year] Today's Date: 02/12/2020   History of Present Illness  Pt is a 69 y.o. F with significant PMH of cataracts who presents for reconstructive efforts for left elbow secondary to fracture sustained in [place]. Now s/p L elbow olectranon osteotomy, ulnar nerve release, and surgical reconstruction distal end of humerus with repair.  Clinical Impression  Prior to admission, pt lives with her spouse and is independent. Pt presents with decreased functional mobility in setting of LUE pain, edema, weakness, and decreased ROM. Pt ambulating room distances with no assistive device at a min assist level. Mildly unsteady, but suspect this will improve by tomorrow. Education provided regarding sling use, cryotherapy, elevation, precautions, and frequent digit ROM. Don't anticipate pt will need follow up PT; will continue to follow acutely to progress mobility.    Follow Up Recommendations No PT follow up;Supervision for mobility/OOB    Equipment Recommendations  None recommended by PT    Recommendations for Other Services       Precautions / Restrictions Precautions Precautions: Fall Required Braces or Orthoses: Sling Restrictions Weight Bearing Restrictions: Yes LUE Weight Bearing: Non weight bearing      Mobility  Bed Mobility Overal bed mobility: Needs Assistance Bed Mobility: Supine to Sit;Sit to Supine     Supine to sit: Min assist Sit to supine: Min assist   General bed mobility comments: MinA for trunk to upright sitting position exiting towards left side of bed. MinA for LE negotiation back into bed and guarding LUE  Transfers Overall transfer level: Needs assistance Equipment used: None Transfers: Sit to/from Stand Sit to Stand: Min guard         General transfer comment: Good power up to stand from edge of bed and toilet  Ambulation/Gait Ambulation/Gait assistance: Min  assist Gait Distance (Feet): 30 Feet Assistive device: None Gait Pattern/deviations: Step-through pattern;Decreased stride length Gait velocity: decreased Gait velocity interpretation: <1.8 ft/sec, indicate of risk for recurrent falls General Gait Details: Pt with increased lateral sway requiring light min assist to steady (likely impacted by surgery/medications)  Stairs            Wheelchair Mobility    Modified Rankin (Stroke Patients Only)       Balance Overall balance assessment: Mild deficits observed, not formally tested                                           Pertinent Vitals/Pain Pain Assessment: Faces Faces Pain Scale: Hurts whole lot Pain Location: LUE Pain Descriptors / Indicators: Sharp;Shooting;Grimacing;Guarding;Aching;Operative site guarding Pain Intervention(s): Limited activity within patient's tolerance;Monitored during session;Premedicated before session;Repositioned;Ice applied    Home Living Family/patient expects to be discharged to:: Private residence Living Arrangements: Spouse/significant other Available Help at Discharge: Family Type of Home: House Home Access: Stairs to enter Entrance Stairs-Rails: None Entrance Stairs-Number of Steps: 2 Home Layout: Able to live on main level with bedroom/bathroom Home Equipment: Shower seat      Prior Function Level of Independence: Independent         Comments: retired     Journalist, newspaper        Extremity/Trunk Assessment   Upper Extremity Assessment Upper Extremity Assessment: Defer to OT evaluation    Lower Extremity Assessment Lower Extremity Assessment: Overall WFL for tasks assessed    Cervical / Trunk Assessment Cervical /  Trunk Assessment: Normal  Communication   Communication: No difficulties  Cognition Arousal/Alertness: Suspect due to medications Behavior During Therapy: WFL for tasks assessed/performed Overall Cognitive Status: Within Functional  Limits for tasks assessed                                 General Comments: Drowsy throughout 2/2 medication/post surgery      General Comments      Exercises     Assessment/Plan    PT Assessment Patient needs continued PT services  PT Problem List Decreased strength;Decreased range of motion;Decreased activity tolerance;Decreased balance;Decreased mobility;Pain       PT Treatment Interventions DME instruction;Stair training;Gait training;Functional mobility training;Therapeutic activities;Therapeutic exercise;Balance training;Patient/family education    PT Goals (Current goals can be found in the Care Plan section)  Acute Rehab PT Goals Patient Stated Goal: less pain PT Goal Formulation: With patient Time For Goal Achievement: 02/26/20 Potential to Achieve Goals: Good    Frequency Min 5X/week   Barriers to discharge        Co-evaluation               AM-PAC PT "6 Clicks" Mobility  Outcome Measure Help needed turning from your back to your side while in a flat bed without using bedrails?: None Help needed moving from lying on your back to sitting on the side of a flat bed without using bedrails?: A Little Help needed moving to and from a bed to a chair (including a wheelchair)?: A Little Help needed standing up from a chair using your arms (e.g., wheelchair or bedside chair)?: A Little Help needed to walk in hospital room?: A Little Help needed climbing 3-5 steps with a railing? : A Little 6 Click Score: 19    End of Session Equipment Utilized During Treatment: Gait belt;Other (comment) (sling) Activity Tolerance: Patient limited by fatigue Patient left: in bed;with call bell/phone within reach;with family/visitor present Nurse Communication: Mobility status PT Visit Diagnosis: Unsteadiness on feet (R26.81);Pain Pain - Right/Left: Left Pain - part of body: Arm    Time: 4403-4742 PT Time Calculation (min) (ACUTE ONLY): 28 min   Charges:    PT Evaluation $PT Eval Low Complexity: 1 Low PT Treatments $Therapeutic Activity: 8-22 mins $Neuromuscular Re-education: 8-22 mins        Wyona Almas, PT, DPT Acute Rehabilitation Services Pager [phone] Office [phone]   Deno Etienne 02/12/2020, 5:12 PM

## 2020-02-12 NOTE — Anesthesia Procedure Notes (Signed)
Procedure Name: LMA Insertion Date/Time: 02/12/2020 7:39 AM Performed by: Alain Marion, CRNA Pre-anesthesia Checklist: Patient identified, Emergency Drugs available, Suction available and Patient being monitored Patient Re-evaluated:Patient Re-evaluated prior to induction Oxygen Delivery Method: Circle System Utilized Preoxygenation: Pre-oxygenation with 100% oxygen Induction Type: IV induction Ventilation: Mask ventilation without difficulty LMA: LMA inserted LMA Size: 4.0 Number of attempts: 1 Airway Equipment and Method: Bite block Placement Confirmation: positive ETCO2 Tube secured with: Tape Dental Injury: Teeth and Oropharynx as per pre-operative assessment

## 2020-02-12 NOTE — Anesthesia Postprocedure Evaluation (Signed)
Anesthesia Post Note  Patient: Brittany Salinas  Procedure(s) Performed: Left elbow olecranon osteotomy, ulnar nerve release, and surgical reconstruction distal end of the humerus with repair (Left Elbow)     Patient location during evaluation: PACU Anesthesia Type: General Level of consciousness: awake and alert Pain management: pain level controlled Vital Signs Assessment: post-procedure vital signs reviewed and stable Respiratory status: spontaneous breathing, nonlabored ventilation and respiratory function stable Cardiovascular status: blood pressure returned to baseline and stable Postop Assessment: no apparent nausea or vomiting Anesthetic complications: no   No complications documented.  Last Vitals:  Vitals:   02/12/20 1200 02/12/20 1215  BP: (!) 148/84 (!) 157/81  Pulse: 94 88  Resp: 13 12  Temp:    SpO2: 99% 100%    Last Pain:  Vitals:   02/12/20 1215  TempSrc:   PainSc: [place]

## 2020-02-13 DIAGNOSIS — I10 Essential (primary) hypertension: Secondary | ICD-10-CM | POA: Diagnosis present

## 2020-02-13 DIAGNOSIS — Z823 Family history of stroke: Secondary | ICD-10-CM | POA: Diagnosis not present

## 2020-02-13 DIAGNOSIS — Z83438 Family history of other disorder of lipoprotein metabolism and other lipidemia: Secondary | ICD-10-CM | POA: Diagnosis not present

## 2020-02-13 DIAGNOSIS — W19XXXA Unspecified fall, initial encounter: Secondary | ICD-10-CM | POA: Diagnosis present

## 2020-02-13 DIAGNOSIS — Z803 Family history of malignant neoplasm of breast: Secondary | ICD-10-CM | POA: Diagnosis not present

## 2020-02-13 DIAGNOSIS — Z20822 Contact with and (suspected) exposure to covid-19: Secondary | ICD-10-CM | POA: Diagnosis present

## 2020-02-13 DIAGNOSIS — Z885 Allergy status to narcotic agent status: Secondary | ICD-10-CM | POA: Diagnosis not present

## 2020-02-13 DIAGNOSIS — Z8249 Family history of ischemic heart disease and other diseases of the circulatory system: Secondary | ICD-10-CM | POA: Diagnosis not present

## 2020-02-13 DIAGNOSIS — S42402A Unspecified fracture of lower end of left humerus, initial encounter for closed fracture: Secondary | ICD-10-CM | POA: Diagnosis present

## 2020-02-13 DIAGNOSIS — S42492A Other displaced fracture of lower end of left humerus, initial encounter for closed fracture: Secondary | ICD-10-CM | POA: Diagnosis present

## 2020-02-13 MED ORDER — METOPROLOL SUCCINATE ER 50 MG PO TB24
50.0000 mg | ORAL_TABLET | Freq: Every day | ORAL | Status: DC
  Administered 2020-02-13: 50 mg via ORAL
  Filled 2020-02-13: qty 1

## 2020-02-13 MED ORDER — BUPROPION HCL ER (XL) 150 MG PO TB24
300.0000 mg | ORAL_TABLET | Freq: Every day | ORAL | Status: DC
  Administered 2020-02-13: 300 mg via ORAL
  Filled 2020-02-13: qty 2

## 2020-02-13 NOTE — Evaluation (Signed)
Occupational Therapy Evaluation Patient Details Name: Brittany Salinas MRN: [id number] DOB: 07/20/1950 Today's Date: 02/13/2020    History of Present Illness Pt is a 69 y.o. F with significant PMH of cataracts who presents for reconstructive efforts for left elbow secondary to fracture sustained in [place]. Now s/p L elbow olectranon osteotomy, ulnar nerve release, and surgical reconstruction distal end of humerus with repair.   Clinical Impression   Patient with functional deficits listed below impacting safety and independence with self care. Patient is independent with ADLs and functional mobility at baseline. Currently patient is requiring min A for UB ADLs and supervision for LB ADL and functional transfers for safety. Educate patient and spouse on compensatory strategies for ADLs and edema management for L UE. Pt assisted with elevation + positioning upon return to semi-supine as patient is requesting to nap. Recommend continued acute OT services to facilitate D/C to venue listed below.    Follow Up Recommendations  Follow surgeon's recommendation for DC plan and follow-up therapies    Equipment Recommendations  None recommended by OT       Precautions / Restrictions Precautions Precautions: Fall Required Braces or Orthoses: Sling Restrictions Weight Bearing Restrictions: Yes LUE Weight Bearing: Non weight bearing      Mobility Bed Mobility Overal bed mobility: Needs Assistance Bed Mobility: Sit to Supine       Sit to supine: Supervision   General bed mobility comments: for safety  Transfers Overall transfer level: Needs assistance Equipment used: None Transfers: Sit to/from Stand Sit to Stand: Supervision         General transfer comment: able to power up from EOB without physical assistance, supervision for safety patient reports feeling very tired    Balance Overall balance assessment: Mild deficits observed, not formally tested                                          ADL either performed or assessed with clinical judgement   ADL Overall ADL's : Needs assistance/impaired     Grooming: Oral care;Supervision/safety;Standing Grooming Details (indicate cue type and reason): cue for compensatory technique to unscrew tooth paste cap with R hand Upper Body Bathing: Minimal assistance;Sitting   Lower Body Bathing: Supervison/ safety;Sit to/from stand;Sitting/lateral leans   Upper Body Dressing : Minimal assistance;Sitting Upper Body Dressing Details (indicate cue type and reason): educate patient and spouse in compensatory strategies for UB dressing, verbalize understanding Lower Body Dressing: Supervision/safety;Sitting/lateral leans Lower Body Dressing Details (indicate cue type and reason): patient able to doff/don sock, educate in one hand technique for donning sock Toilet Transfer: Supervision/safety;Ambulation Toilet Transfer Details (indicate cue type and reason): patient ambulating from bathroom upon arrival, no assist needed to get on/off toilet per patient/spouse Toileting- Clothing Manipulation and Hygiene: Supervision/safety;Sitting/lateral lean;Sit to/from stand       Functional mobility during ADLs: Supervision/safety General ADL Comments: patient + spouse educated in edema management and compensatory strategies for ADLs, verbalize understanding                  Pertinent Vitals/Pain Pain Assessment: 0-10 Pain Score: 7  Pain Location: LUE Pain Descriptors / Indicators: Grimacing;Guarding;Aching;Operative site guarding Pain Intervention(s): Repositioned     Hand Dominance Right   Extremity/Trunk Assessment Upper Extremity Assessment Upper Extremity Assessment: RUE deficits/detail;LUE deficits/detail RUE Deficits / Details: WFL LUE Deficits / Details: edematous L hand, educated in edema management techniques fist  pumps, elevation    Lower Extremity Assessment Lower Extremity Assessment: Defer to  PT evaluation       Communication Communication Communication: No difficulties   Cognition Arousal/Alertness: Awake/alert Behavior During Therapy: WFL for tasks assessed/performed Overall Cognitive Status: Within Functional Limits for tasks assessed                                                Home Living Family/patient expects to be discharged to:: Private residence Living Arrangements: Spouse/significant other Available Help at Discharge: Family Type of Home: House Home Access: Stairs to enter Technical brewer of Steps: 2 Entrance Stairs-Rails: None Home Layout: Able to live on main level with bedroom/bathroom     Bathroom Shower/Tub: Occupational psychologist: Standard     Home Equipment: Shower seat          Prior Functioning/Environment Level of Independence: Independent        Comments: retired        Secretary/administrator Problem List: Decreased range of motion;Pain;Impaired UE functional use;Impaired sensation;Increased edema      OT Treatment/Interventions: Self-care/ADL training;Therapeutic exercise;Therapeutic activities;Patient/family education;Balance training    OT Goals(Current goals can be found in the care plan section) Acute Rehab OT Goals Patient Stated Goal: less pain OT Goal Formulation: With patient Time For Goal Achievement: 02/27/20 Potential to Achieve Goals: Good  OT Frequency: Min 2X/week    AM-PAC OT "6 Clicks" Daily Activity     Outcome Measure Help from another person eating meals?: A Little Help from another person taking care of personal grooming?: A Little Help from another person toileting, which includes using toliet, bedpan, or urinal?: A Little Help from another person bathing (including washing, rinsing, drying)?: A Little Help from another person to put on and taking off regular upper body clothing?: A Little Help from another person to put on and taking off regular lower body clothing?: A Little 6  Click Score: 18   End of Session Equipment Utilized During Treatment: Other (comment) (sling) Nurse Communication: Mobility status  Activity Tolerance: Patient tolerated treatment well Patient left: in bed;with call bell/phone within reach;with family/visitor present  OT Visit Diagnosis: Pain Pain - Right/Left: Left Pain - part of body: Arm                Time: 2751-7001 OT Time Calculation (min): 26 min Charges:  OT General Charges $OT Visit: 1 Visit OT Evaluation $OT Eval Low Complexity: 1 Low OT Treatments $Self Care/Home Management : 8-22 mins  Delbert Phenix OT OT pager: [place] 02/13/2020, 12:26 PM

## 2020-02-13 NOTE — Progress Notes (Deleted)
Occupational Therapy Evaluation Patient Details Name: Brittany Salinas MRN: [id number] DOB: 03-08-1951 Today's Date: 02/13/2020    History of Present Illness Pt is a 69 y.o. F with significant PMH of cataracts who presents for reconstructive efforts for left elbow secondary to fracture sustained in [place]. Now s/p L elbow olectranon osteotomy, ulnar nerve release, and surgical reconstruction distal end of humerus with repair.   Clinical Impression   Patient with functional deficits listed below impacting safety and independence with self care. Patient is independent with ADLs and functional mobility at baseline. Currently patient is requiring min A for UB ADLs and supervision for LB ADL and functional transfers for safety. Educate patient and spouse on compensatory strategies for ADLs and edema management for L UE. Pt assisted with elevation + positioning upon return to semi-supine as patient is requesting to nap. Recommend continued acute OT services to facilitate D/C to venue listed below.    Follow Up Recommendations  Follow surgeons recommendation for DC plan and follow-up therapies    Equipment Recommendations  None recommended by OT       Precautions / Restrictions Precautions Precautions: Fall Required Braces or Orthoses: Sling Restrictions Weight Bearing Restrictions: Yes LUE Weight Bearing: Non weight bearing      Mobility Bed Mobility Overal bed mobility: Needs Assistance Bed Mobility: Sit to Supine       Sit to supine: Supervision   General bed mobility comments: for safety  Transfers Overall transfer level: Needs assistance Equipment used: None Transfers: Sit to/from Stand Sit to Stand: Supervision         General transfer comment: able to power up from EOB without physical assistance, supervision for safety patient reports feeling very tired    Balance Overall balance assessment: Mild deficits observed, not formally tested                                          ADL either performed or assessed with clinical judgement   ADL Overall ADL's : Needs assistance/impaired     Grooming: Oral care;Supervision/safety;Standing Grooming Details (indicate cue type and reason): cue for compensatory technique to unscrew tooth paste cap with R hand Upper Body Bathing: Minimal assistance;Sitting   Lower Body Bathing: Supervison/ safety;Sit to/from stand;Sitting/lateral leans   Upper Body Dressing : Minimal assistance;Sitting Upper Body Dressing Details (indicate cue type and reason): educate patient and spouse in compensatory strategies for UB dressing, verbalize understanding Lower Body Dressing: Supervision/safety;Sitting/lateral leans Lower Body Dressing Details (indicate cue type and reason): patient able to doff/don sock, educate in one hand technique for donning sock Toilet Transfer: Supervision/safety;Ambulation Toilet Transfer Details (indicate cue type and reason): patient ambulating from bathroom upon arrival, no assist needed to get on/off toilet per patient/spouse Toileting- Clothing Manipulation and Hygiene: Supervision/safety;Sitting/lateral lean;Sit to/from stand       Functional mobility during ADLs: Supervision/safety General ADL Comments: patient + spouse educated in edema management and compensatory strategies for ADLs, verbalize understanding                  Pertinent Vitals/Pain Pain Assessment: 0-10 Pain Score: 7  Pain Location: LUE Pain Descriptors / Indicators: Grimacing;Guarding;Aching;Operative site guarding Pain Intervention(s): Repositioned     Hand Dominance Right   Extremity/Trunk Assessment Upper Extremity Assessment Upper Extremity Assessment: RUE deficits/detail;LUE deficits/detail RUE Deficits / Details: WFL LUE Deficits / Details: edematous L hand, educated in edema management techniques fist  pumps, elevation    Lower Extremity Assessment Lower Extremity Assessment: Defer to  PT evaluation       Communication Communication Communication: No difficulties   Cognition Arousal/Alertness: Awake/alert Behavior During Therapy: WFL for tasks assessed/performed Overall Cognitive Status: Within Functional Limits for tasks assessed                                                Home Living Family/patient expects to be discharged to:: Private residence Living Arrangements: Spouse/significant other Available Help at Discharge: Family Type of Home: House Home Access: Stairs to enter Technical brewer of Steps: 2 Entrance Stairs-Rails: None Home Layout: Able to live on main level with bedroom/bathroom     Bathroom Shower/Tub: Occupational psychologist: Standard     Home Equipment: Shower seat          Prior Functioning/Environment Level of Independence: Independent        Comments: retired        Secretary/administrator Problem List: Decreased range of motion;Pain;Impaired UE functional use;Impaired sensation;Increased edema         OT Goals(Current goals can be found in the care plan section) Acute Rehab OT Goals Patient Stated Goal: less pain OT Goal Formulation: With patient Time For Goal Achievement: 02/27/20 Potential to Achieve Goals: Good  OT Frequency:      AM-PAC OT "6 Clicks" Daily Activity     Outcome Measure Help from another person eating meals?: A Little Help from another person taking care of personal grooming?: A Little Help from another person toileting, which includes using toliet, bedpan, or urinal?: A Little Help from another person bathing (including washing, rinsing, drying)?: A Little Help from another person to put on and taking off regular upper body clothing?: A Little Help from another person to put on and taking off regular lower body clothing?: A Little 6 Click Score: 18   End of Session Equipment Utilized During Treatment: Other (comment) (sling) Nurse Communication: Mobility status  Activity  Tolerance: Patient tolerated treatment well Patient left: in bed;with call bell/phone within reach;with family/visitor present  OT Visit Diagnosis: Pain Pain - Right/Left: Left Pain - part of body: Arm                Time: 3382-5053 OT Time Calculation (min): 26 min Charges:  OT General Charges $OT Visit: 1 Visit OT Evaluation $OT Eval Low Complexity: 1 Low OT Treatments $Self Care/Home Management : 8-22 mins  Delbert Phenix OT OT pager: [place] 02/13/2020, 12:19 PM

## 2020-02-13 NOTE — Progress Notes (Signed)
Patient ID: Brittany Salinas, female   DOB: [birth year], 69 y.o.   MRN: [id number] This is a 69 year old female status post ORIF left elbow fracture yesterday.  She is doing reasonably well.  She has stable vital signs.  She has her drain removed at bedside without difficulty.  She has intact flexion and extension of the fingers and thumb.  She has some sensory disturbance in the small finger as expected and the median nerve distribution looks quite well.  I discussed with the patient the importance of elevation edema control massage and movement of the fingers.  We had extensive conversation at her bedside regarding this.  Chest is clear  Abdomen is nontender  She is tolerating her diet and voiding.  Lower extremity examination shows no signs of DVT and she is ambulatory.  We will continue antibiotics in the form of Ancef and pain control measures today.  Possibly discharge home tomorrow if she is doing reasonably well.  I will asked therapy to see her.  Occupational Therapy was at bedside with my exam and we discussed issues.  Pleasure to see her today.  Shaughnessy Gethers MD

## 2020-02-13 NOTE — Care Management Obs Status (Signed)
Index NOTIFICATION   Patient Details  Name: Brittany Salinas MRN: [id number] Date of Birth: October 09, 1950   Medicare Observation Status Notification Given:  Yes    Carles Collet, RN 02/13/2020, 4:36 PM

## 2020-02-14 ENCOUNTER — Encounter (HOSPITAL_COMMUNITY): Payer: Self-pay | Admitting: Orthopedic Surgery

## 2020-02-14 MED ORDER — CEPHALEXIN 500 MG PO CAPS: 500.0000 mg | ORAL_CAPSULE | Freq: Four times a day (QID) | ORAL | 0 refills | Status: AC

## 2020-02-14 MED ORDER — CEPHALEXIN 500 MG PO CAPS
500.0000 mg | ORAL_CAPSULE | Freq: Once | ORAL | Status: AC
  Administered 2020-02-14: 500 mg via ORAL
  Filled 2020-02-14: qty 1

## 2020-02-14 NOTE — Progress Notes (Signed)
Nsg Discharge Note  Admit Date:  02/12/2020 Discharge date: 02/14/2020   Brittany Salinas to be D/C'd Home per MD order.  AVS completed.  Copy for chart, and copy for patient signed, and dated. Patient/caregiver able to verbalize understanding.  Discharge Medication: Allergies as of 02/14/2020      Reactions   Codeine Nausea And Vomiting      Medication List    TAKE these medications   aspirin EC 81 MG tablet Take 81 mg by mouth daily. Swallow whole.   buPROPion 300 MG 24 hr tablet Commonly known as: WELLBUTRIN XL TAKE 1 TABLET BY MOUTH EVERY DAY   cephALEXin 500 MG capsule Commonly known as: KEFLEX Take 1 capsule (500 mg total) by mouth 4 (four) times daily for 7 days.   Krill Oil 500 MG Caps Take 500 mg by mouth daily.   magnesium oxide 400 MG tablet Commonly known as: MAG-OX Take 400 mg by mouth at bedtime.   meclizine 12.5 MG tablet Commonly known as: ANTIVERT Take 1 tablet (12.5 mg total) by mouth 3 (three) times daily as needed for dizziness.   Melatonin 10 MG Tabs Take 10 mg by mouth at bedtime.   methocarbamol 500 MG tablet Commonly known as: ROBAXIN Take 500 mg by mouth every 6 (six) hours as needed for muscle spasms.   metoprolol succinate 50 MG 24 hr tablet Commonly known as: TOPROL-XL TAKE 1 TABLET BY MOUTH EVERY DAY   niacin 500 MG tablet Take 500 mg by mouth daily.   oxyCODONE 5 MG immediate release tablet Commonly known as: Oxy IR/ROXICODONE Take 5 mg by mouth every 4 (four) hours as needed for pain.   rosuvastatin 10 MG tablet Commonly known as: CRESTOR TAKE 1 TABLET BY MOUTH EVERY DAY What changed: when to take this   sertraline 50 MG tablet Commonly known as: ZOLOFT Take 1 tablet (50 mg total) by mouth daily. NEEDS OV BEFORE NEXT FILL What changed: additional instructions   TURMERIC CURCUMIN PO Take 750 mg by mouth daily.   vitamin C 1000 MG tablet Take 1,000 mg by mouth daily.   Vitamin D3 125 MCG (5000 UT) Tabs Take 5,000  Units by mouth daily.       Discharge Assessment: Vitals:   02/14/20 0056 02/14/20 0605  BP: (!) 142/62 129/79  Pulse: 70 74  Resp: 15 16  Temp: 98.7 F (37.1 C) 97.8 F (36.6 C)  SpO2: 96% 100%   Skin clean, dry and intact without evidence of skin break down, no evidence of skin tears noted. IV catheter discontinued intact. Site without signs and symptoms of complications - no redness or edema noted at insertion site, patient denies c/o pain - only slight tenderness at site.  Dressing with slight pressure applied.  D/c Instructions-Education: Discharge instructions given to patient/family with verbalized understanding. D/c education completed with patient/family including follow up instructions, medication list, d/c activities limitations if indicated, with other d/c instructions as indicated by MD - patient able to verbalize understanding, all questions fully answered. Patient instructed to return to ED, call 911, or call MD for any changes in condition.  Patient escorted via [name], and D/C home via private auto.  Erasmo Leventhal, RN 02/14/2020 11:32 AM

## 2020-02-14 NOTE — Discharge Instructions (Signed)
Please keep your bandage clean and dry.  We have previously phoned in medicines for you through our office on October 4.  Please pick those up for pain control and spasm.  Please continue a stool softener.  Please add a laxative if you have constipation.  I have phoned in an antibiotic for you to take over the next 7 days.  Please notify should any problems occur.  We recommend that you to take vitamin C 1000 mg a day to promote healing. We also recommend that if you require  pain medicine that you take a stool softener to prevent constipation as most pain medicines will have constipation side effects. We recommend either Peri-Colace or Senokot and recommend that you also consider adding MiraLAX as well to prevent the constipation affects from pain medicine if you are required to use them. These medicines are over the counter and may be purchased at a local pharmacy. A cup of yogurt and a probiotic can also be helpful during the recovery process as the medicines can disrupt your intestinal environment. Keep bandage clean and dry.  Call for any problems.  No smoking.  Criteria for driving a car: you should be off your pain medicine for 7-8 hours, able to drive one handed(confident), thinking clearly and feeling able in your judgement to drive. Continue elevation as it will decrease swelling.  If instructed by MD move your fingers within the confines of the bandage/splint.  Use ice if instructed by your MD. Call immediately for any sudden loss of feeling in your hand/arm or change in functional abilities of the extremity.

## 2020-02-14 NOTE — Progress Notes (Signed)
Pt has lost IV site; notified on call dr. Konrad Dolores received N.O. for Keflex 500mg  po x one. T.O Dr. Tonita Cong.

## 2020-02-14 NOTE — Discharge Summary (Signed)
Physician Discharge Summary  Patient ID: Brittany Salinas MRN: [id number] DOB/AGE: Jul 30, 1950 69 y.o.  Admit date: 02/12/2020 Discharge date:   Admission Diagnoses: left elbow comminuted complex intra-articular fracture Past Medical History:  Diagnosis Date   Arthritis    OA   Cataracts, both eyes    Depression    Family history of adverse reaction to anesthesia    mother - slow to awaken   Hyperlipidemia    Hypertension    Insomnia    Memory loss    Right foot pain 10/09/2017   Vision abnormalities     Discharge Diagnoses:  Active Problems:   Left elbow fracture   Surgeries: Procedure(s): Left elbow olecranon osteotomy, ulnar nerve release, and surgical reconstruction distal end of the humerus with repair on 02/12/2020    Consultants:   Discharged Condition: Improved  Hospital Course: Brittany Salinas is an 69 y.o. female who was admitted 02/12/2020 with a chief complaint of No chief complaint on file. , and found to have a diagnosis of left elbow comminuted complex intra-articular fracture.  They were brought to the operating room on 02/12/2020 and underwent Procedure(s): Left elbow olecranon osteotomy, ulnar nerve release, and surgical reconstruction distal end of the humerus with repair.    They were given perioperative antibiotics:  Anti-infectives (From admission, onward)   Start     Dose/Rate Route Frequency Ordered Stop   02/14/20 0600  cephALEXin (KEFLEX) capsule 500 mg        500 mg Oral  Once 02/14/20 0555 02/14/20 0627   02/14/20 0000  cephALEXin (KEFLEX) 500 MG capsule        500 mg Oral 4 times daily 02/14/20 0810 02/21/20 2359   02/12/20 2200  ceFAZolin (ANCEF) IVPB 1 g/50 mL premix        1 g 100 mL/hr over 30 Minutes Intravenous Every 8 hours 02/12/20 1322     02/12/20 1430  ceFAZolin (ANCEF) IVPB 1 g/50 mL premix        1 g 100 mL/hr over 30 Minutes Intravenous NOW 02/12/20 1322 02/12/20 1541   02/12/20 0630  ceFAZolin (ANCEF) IVPB 2g/100 mL  premix  Status:  Discontinued        2 g 200 mL/hr over 30 Minutes Intravenous On call to O.R. 02/12/20 0620 02/12/20 1433   02/12/20 0625  ceFAZolin (ANCEF) 2-4 GM/100ML-% IVPB       Note to Pharmacy: Nyoka Cowden   : cabinet override      02/12/20 0625 02/12/20 1829    .  They were given sequential compression devices, early ambulation, and   Recent vital signs:  Patient Vitals for the past 24 hrs:  BP Temp Temp src Pulse Resp SpO2  02/14/20 0605 129/79 97.8 F (36.6 C) Oral 74 16 100 %  02/14/20 0056 (!) 142/62 98.7 F (37.1 C) Oral 70 15 96 %  02/13/20 1718 125/68 -- -- 78 -- 96 %  02/13/20 1210 132/73 98.2 F (36.8 C) Oral 76 16 90 %  02/13/20 0822 133/71 -- -- 71 16 --  .  Recent laboratory studies: No results found.  Discharge Medications:   Allergies as of 02/14/2020      Reactions   Codeine Nausea And Vomiting      Medication List    TAKE these medications   aspirin EC 81 MG tablet Take 81 mg by mouth daily. Swallow whole.   buPROPion 300 MG 24 hr tablet Commonly known as: WELLBUTRIN XL TAKE 1 TABLET BY MOUTH EVERY  DAY   cephALEXin 500 MG capsule Commonly known as: KEFLEX Take 1 capsule (500 mg total) by mouth 4 (four) times daily for 7 days.   Krill Oil 500 MG Caps Take 500 mg by mouth daily.   magnesium oxide 400 MG tablet Commonly known as: MAG-OX Take 400 mg by mouth at bedtime.   meclizine 12.5 MG tablet Commonly known as: ANTIVERT Take 1 tablet (12.5 mg total) by mouth 3 (three) times daily as needed for dizziness.   Melatonin 10 MG Tabs Take 10 mg by mouth at bedtime.   methocarbamol 500 MG tablet Commonly known as: ROBAXIN Take 500 mg by mouth every 6 (six) hours as needed for muscle spasms.   metoprolol succinate 50 MG 24 hr tablet Commonly known as: TOPROL-XL TAKE 1 TABLET BY MOUTH EVERY DAY   niacin 500 MG tablet Take 500 mg by mouth daily.   oxyCODONE 5 MG immediate release tablet Commonly known as: Oxy  IR/ROXICODONE Take 5 mg by mouth every 4 (four) hours as needed for pain.   rosuvastatin 10 MG tablet Commonly known as: CRESTOR TAKE 1 TABLET BY MOUTH EVERY DAY What changed: when to take this   sertraline 50 MG tablet Commonly known as: ZOLOFT Take 1 tablet (50 mg total) by mouth daily. NEEDS OV BEFORE NEXT FILL What changed: additional instructions   TURMERIC CURCUMIN PO Take 750 mg by mouth daily.   vitamin C 1000 MG tablet Take 1,000 mg by mouth daily.   Vitamin D3 125 MCG (5000 UT) Tabs Take 5,000 Units by mouth daily.       Diagnostic Studies: CT ELBOW LEFT WO CONTRAST  Result Date: 02/07/2020 CLINICAL DATA:  Left elbow pain after fall EXAM: CT OF THE UPPER LEFT EXTREMITY WITHOUT CONTRAST TECHNIQUE: Multidetector CT imaging of the upper left extremity was performed according to the standard protocol. COMPARISON:  None available FINDINGS: Technical note: Examination is degraded by motion artifact. Bones/Joint/Cartilage Acute comminuted fracture of the distal left humerus with anterior and medial displacement. Fracture line extends intra-articularly to the radiocapitellar joint. No significant angulation. No definite fractures of the proximal radius or ulna identified on motion degraded images. Radiocapitellar and ulnotrochlear joint alignment maintained without dislocation. Large elbow joint hemarthrosis. Ligaments Suboptimally assessed by CT. Muscles and Tendons Musculotendinous structures are suboptimally evaluated on motion degraded images. Soft tissues Prominent soft tissue swelling and hemorrhage most pronounced at the posterior aspect of the elbow. No well-defined hematoma. IMPRESSION: 1. Acute comminuted displaced fracture of the distal left humerus with intra-articular extension to the radiocapitellar joint. 2. Large elbow joint hemarthrosis. Electronically Signed   By: Davina Poke D.O.   On: 02/07/2020 10:55   DG MINI C-ARM IMAGE ONLY  Result Date: 02/12/2020 There  is no interpretation for this exam.  This order is for images obtained during a surgical procedure.  Please See "Surgeries" Tab for more information regarding the procedure.    They benefited maximally from their hospital stay and there were no complications.     Disposition: Discharge disposition: 01-Home or Self Care      Discharge Instructions    Call MD / Call 911   Complete by: As directed    If you experience chest pain or shortness of breath, CALL 911 and be transported to the hospital emergency room.  If you develope a fever above 101 F, pus (white drainage) or increased drainage or redness at the wound, or calf pain, call your surgeon's office.   Constipation Prevention  Complete by: As directed    Drink plenty of fluids.  Prune juice may be helpful.  You may use a stool softener, such as Colace (over the counter) 100 mg twice a day.  Use MiraLax (over the counter) for constipation as needed.   Diet - low sodium heart healthy   Complete by: As directed    Increase activity slowly as tolerated   Complete by: As directed       Follow-up Information    Roseanne Kaufman, MD Follow up in 14 day(s).   Specialty: Orthopedic Surgery Why: We will call for your follow-up appointment to be seen in 14 days and have therapy immediately following Contact information: [address] [place] [address] [phone]               Status post reconstruction left elbow with olecranon osteotomy ORIF distal humerus and ulnar nerve transposition.  Patient is doing very well.  Discharge today.  No signs of DVT infection dystrophy or vascular compromise.     Patient is tolerating regular diet.  She is voiding well.  We have discussed all issues.  Please see discharge medicines and plans.  I will see her back in 2 weeks and will ask her to notify should any problems occur.   Signed: Satira Anis Orvile Corona III 02/14/2020, 8:11 AM

## 2020-02-14 NOTE — Progress Notes (Signed)
Physical Therapy Treatment Patient Details Name: Brittany Salinas MRN: [id number] DOB: Apr 14, 1951 Today's Date: 02/14/2020    History of Present Illness Pt is a 69 y.o. F with significant PMH of cataracts who presents for reconstructive efforts for left elbow secondary to fracture sustained in [place]. Now s/p L elbow olectranon osteotomy, ulnar nerve release, and surgical reconstruction distal end of humerus with repair.    PT Comments    Pt fully participated in session; therapist addressed all questions regarding level of assist and exercises from husband; RN to follow up with regarding sling recommendations; pt requiring intermittent S during ambulation due to tripping on her shoe, education on proper shoes to decrease risk of falling; education on progressing balance activities once able to put weight through LUE; education to pt and husband regarding exercises, ice and elevation, verbalized understanding; no further PT needs noted, pt is safe to d/c home   Follow Up Recommendations  No PT follow up;Supervision for mobility/OOB     Equipment Recommendations  None recommended by PT    Recommendations for Other Services       Precautions / Restrictions Precautions Precautions: Fall Required Braces or Orthoses: Sling Restrictions Weight Bearing Restrictions: Yes LUE Weight Bearing: Non weight bearing    Mobility  Bed Mobility Overal bed mobility: Modified Independent Bed Mobility: Supine to Sit     Supine to sit: Modified independent (Device/Increase time)        Transfers Overall transfer level: Modified independent   Transfers: Sit to/from Stand Sit to Stand: Modified independent (Device/Increase time)            Ambulation/Gait Ambulation/Gait assistance: Supervision Gait Distance (Feet): 200 Feet     Gait velocity: decreased; pt tripped x 3 while wearing crocks, PT educated pt on shoe choices and to pick her up feet more to avoid tripping; pt able to  follow commands and gait improved; pt states she will wear different shoes at home       Stairs             Wheelchair Mobility    Modified Rankin (Stroke Patients Only)       Balance Overall balance assessment: Mild deficits observed, not formally tested                                          Cognition Arousal/Alertness: Awake/alert Behavior During Therapy: WFL for tasks assessed/performed Overall Cognitive Status: Within Functional Limits for tasks assessed                                        Exercises      General Comments General comments (skin integrity, edema, etc.): pt stated she likes to be outside, educated on having husband present and wearing tennis shoes; pt agreeable; pt educated on balance activities to perform once cast is off and she can put weight through LUE; pt with question regarding sling wear, informed RN; education to pt regarding shoulder ROM flexion and abduction, education on ice and elevation      Pertinent Vitals/Pain Pain Score: 3  Pain Location: LUE    Home Living                      Prior Function  PT Goals (current goals can now be found in the care plan section) Acute Rehab PT Goals Patient Stated Goal: to go home PT Goal Formulation: With patient Time For Goal Achievement: 02/26/20 Potential to Achieve Goals: Good Progress towards PT goals: Progressing toward goals    Frequency    Min 5X/week      PT Plan Current plan remains appropriate    Salinas-evaluation              AM-PAC PT "6 Clicks" Mobility   Outcome Measure  Help needed turning from your back to your side while in a flat bed without using bedrails?: None Help needed moving from lying on your back to sitting on the side of a flat bed without using bedrails?: None Help needed moving to and from a bed to a chair (including a wheelchair)?: None Help needed standing up from a chair using your  arms (e.g., wheelchair or bedside chair)?: None Help needed to walk in hospital room?: A Little Help needed climbing 3-5 steps with a railing? : A Little 6 Click Score: 22    End of Session Equipment Utilized During Treatment: Gait belt Activity Tolerance: Patient tolerated treatment well Patient left: in chair;with family/visitor present;with call bell/phone within reach Nurse Communication: Mobility status PT Visit Diagnosis: Unsteadiness on feet (R26.81);Pain     Time: 8676-7209 PT Time Calculation (min) (ACUTE ONLY): 24 min  Charges:  $Gait Training: 8-22 mins $Therapeutic Activity: 8-22 mins                     Brittany Salinas, DPT Acute Rehabilitation Services [phone]   [place] 02/14/2020, 12:09 PM

## 2020-02-17 ENCOUNTER — Other Ambulatory Visit: Payer: Self-pay | Admitting: Family Medicine

## 2020-02-28 DIAGNOSIS — Z4789 Encounter for other orthopedic aftercare: Secondary | ICD-10-CM | POA: Diagnosis not present

## 2020-02-28 DIAGNOSIS — M25522 Pain in left elbow: Secondary | ICD-10-CM | POA: Diagnosis not present

## 2020-02-28 DIAGNOSIS — S42402D Unspecified fracture of lower end of left humerus, subsequent encounter for fracture with routine healing: Secondary | ICD-10-CM | POA: Diagnosis not present

## 2020-03-01 DIAGNOSIS — H25811 Combined forms of age-related cataract, right eye: Secondary | ICD-10-CM | POA: Diagnosis not present

## 2020-03-01 DIAGNOSIS — H2511 Age-related nuclear cataract, right eye: Secondary | ICD-10-CM | POA: Diagnosis not present

## 2020-03-06 DIAGNOSIS — M25522 Pain in left elbow: Secondary | ICD-10-CM | POA: Diagnosis not present

## 2020-03-09 ENCOUNTER — Other Ambulatory Visit: Payer: Self-pay

## 2020-03-09 ENCOUNTER — Telehealth (INDEPENDENT_AMBULATORY_CARE_PROVIDER_SITE_OTHER): Payer: PPO | Admitting: Family Medicine

## 2020-03-09 DIAGNOSIS — Z1211 Encounter for screening for malignant neoplasm of colon: Secondary | ICD-10-CM

## 2020-03-09 DIAGNOSIS — S42402S Unspecified fracture of lower end of left humerus, sequela: Secondary | ICD-10-CM | POA: Diagnosis not present

## 2020-03-09 DIAGNOSIS — E2839 Other primary ovarian failure: Secondary | ICD-10-CM

## 2020-03-09 DIAGNOSIS — Z1239 Encounter for other screening for malignant neoplasm of breast: Secondary | ICD-10-CM | POA: Diagnosis not present

## 2020-03-09 DIAGNOSIS — I10 Essential (primary) hypertension: Secondary | ICD-10-CM | POA: Diagnosis not present

## 2020-03-09 DIAGNOSIS — Z780 Asymptomatic menopausal state: Secondary | ICD-10-CM | POA: Diagnosis not present

## 2020-03-09 DIAGNOSIS — M25522 Pain in left elbow: Secondary | ICD-10-CM | POA: Diagnosis not present

## 2020-03-09 DIAGNOSIS — Z9889 Other specified postprocedural states: Secondary | ICD-10-CM | POA: Diagnosis not present

## 2020-03-09 DIAGNOSIS — E785 Hyperlipidemia, unspecified: Secondary | ICD-10-CM

## 2020-03-09 DIAGNOSIS — Z86010 Personal history of colonic polyps: Secondary | ICD-10-CM

## 2020-03-09 DIAGNOSIS — F418 Other specified anxiety disorders: Secondary | ICD-10-CM

## 2020-03-09 NOTE — Assessment & Plan Note (Signed)
Encouraged heart healthy diet, increase exercise, avoid trans fats, consider a krill oil cap daily 

## 2020-03-11 ENCOUNTER — Encounter: Payer: Self-pay | Admitting: Family Medicine

## 2020-03-11 NOTE — Assessment & Plan Note (Signed)
Doing well on Sertraline 50 mg daily.

## 2020-03-11 NOTE — Assessment & Plan Note (Signed)
Monitor and report any concerns. no changes to meds. Encouraged heart healthy diet such as the DASH diet and exercise as tolerated.  

## 2020-03-11 NOTE — Assessment & Plan Note (Signed)
She fell while on a trip to [place] and she was able to return and have surgery with dr Amedeo Plenty at [facility].

## 2020-03-11 NOTE — Progress Notes (Signed)
Virtual Visit via Video Note  I connected with Brittany Salinas on 03/09/20 at  9:40 AM EDT by a video enabled telemedicine application and verified that I am speaking with the correct person using two identifiers.  Location: Patient: home, patient and provider are in the visit Provider: home   I discussed the limitations of evaluation and management by telemedicine and the availability of in person appointments. The patient expressed understanding and agreed to proceed. S Chism, CMA was able to get the patient set up on a video visit    Subjective:    Patient ID: Brittany Salinas, female    DOB: 07-04-1950, 69 y.o.   MRN: [id number]  No chief complaint on file.   HPI Patient is in today for follow up on chronic medical concerns. No recent febrile illness or hospitalizations. She suffered a bad fracture of her left arm on September 30 when she fell while in [place]. She has undergone surgery and physical therapy with Dr Amedeo Plenty at [facility] and she is doing much better. She has had both cataracts removed and is seeing much better. Is trying to eat well and stay active. Denies CP/palp/SOB/HA/congestion/fevers/GI or GU c/o. Taking meds as prescribed  Past Medical History:  Diagnosis Date  . Arthritis    OA  . Cataracts, both eyes   . Depression   . Family history of adverse reaction to anesthesia    mother - slow to awaken  . Hyperlipidemia   . Hypertension   . Insomnia   . Memory loss   . Right foot pain 10/09/2017  . Vision abnormalities     Past Surgical History:  Procedure Laterality Date  . BREAST BIOPSY Right    benign  . BREAST REDUCTION SURGERY  2007  . CESAREAN SECTION     4 c-sections  . COLONOSCOPY W/ POLYPECTOMY    . EYE SURGERY Bilateral    catarcts  . ORIF ELBOW FRACTURE Left 02/12/2020   Procedure: Left elbow olecranon osteotomy, ulnar nerve release, and surgical reconstruction distal end of the humerus with repair;  Surgeon: Roseanne Kaufman, MD;  Location: [facility];  Service: Orthopedics;  Laterality: Left;  2.5 hrs  . REDUCTION MAMMAPLASTY Bilateral   . ROTATOR CUFF REPAIR Bilateral    right in 2010, left in 2017.  . TONSILLECTOMY      Family History  Problem Relation Age of Onset  . Heart defect Mother        valvular heart disease  . Heart attack Father   . Hyperlipidemia Father   . Hypertension Father   . Breast cancer Maternal Aunt   . Marfan syndrome Sister   . Hypertension Brother   . Other Brother        vertigo  . Psoriasis Daughter   . Mental illness Son        depression alcohol in past  . Depression Son   . Cancer Maternal Grandmother        stomach  . Stroke Maternal Grandfather   . Marfan syndrome Maternal Grandfather        ?  Marland Kitchen Arrhythmia Son   . Alcohol abuse Son        h/o drug abuse    Social History   Socioeconomic History  . Marital status: Married    Spouse name: Not on file  . Number of children: Not on file  . Years of education: Not on file  . Highest education level: Not on file  Occupational History  . Not on file  Tobacco Use  . Smoking status: Never Smoker  . Smokeless tobacco: Never Used  Vaping Use  . Vaping Use: Never used  Substance and Sexual Activity  . Alcohol use: Yes    Alcohol/week: 2.0 standard drinks    Types: 2 Glasses of wine per week  . Drug use: No  . Sexual activity: Not on file  Other Topics Concern  . 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 Uses occasional light alcohol   Social Determinants of Health   Financial Resource Strain:   . Difficulty of Paying Living Expenses: Not on file  Food Insecurity:   . Worried About Charity fundraiser in the Last Year: Not on file  . Ran Out of Food in the Last Year: Not on file  Transportation Needs:   . Lack of Transportation (Medical): Not on file  . Lack of Transportation (Non-Medical): Not on file   Physical Activity:   . Days of Exercise per Week: Not on file  . Minutes of Exercise per Session: Not on file  Stress:   . Feeling of Stress : Not on file  Social Connections:   . Frequency of Communication with Friends and Family: Not on file  . Frequency of Social Gatherings with Friends and Family: Not on file  . Attends Religious Services: Not on file  . Active Member of Clubs or Organizations: Not on file  . Attends Archivist Meetings: Not on file  . Marital Status: Not on file  Intimate Partner Violence:   . Fear of Current or Ex-Partner: Not on file  . Emotionally Abused: Not on file  . Physically Abused: Not on file  . Sexually Abused: Not on file    Outpatient Medications Prior to Visit  Medication Sig Dispense Refill  . Ascorbic Acid (VITAMIN C) 1000 MG tablet Take 1,000 mg by mouth daily.    Marland Kitchen aspirin EC 81 MG tablet Take 81 mg by mouth daily. Swallow whole.    Marland Kitchen buPROPion (WELLBUTRIN XL) 300 MG 24 hr tablet TAKE 1 TABLET BY MOUTH EVERY DAY (Patient taking differently: Take 300 mg by mouth daily. ) 90 tablet 0  . Cholecalciferol (VITAMIN D3) 125 MCG (5000 UT) TABS Take 5,000 Units by mouth daily.    Javier Docker Oil 500 MG CAPS Take 500 mg by mouth daily.    . magnesium oxide (MAG-OX) 400 MG tablet Take 400 mg by mouth at bedtime.    . meclizine (ANTIVERT) 12.5 MG tablet Take 1 tablet (12.5 mg total) by mouth 3 (three) times daily as needed for dizziness. 30 tablet 0  . Melatonin 10 MG TABS Take 10 mg by mouth at bedtime.    . methocarbamol (ROBAXIN) 500 MG tablet Take 500 mg by mouth every 6 (six) hours as needed for muscle spasms.    . metoprolol succinate (TOPROL-XL) 50 MG 24 hr tablet TAKE 1 TABLET BY MOUTH EVERY DAY 90 tablet 1  . niacin 500 MG tablet Take 500 mg by mouth daily.    Marland Kitchen oxyCODONE (OXY IR/ROXICODONE) 5 MG immediate release tablet Take 5 mg by mouth every 4 (four) hours as needed for pain.    . rosuvastatin (CRESTOR) 10 MG tablet TAKE 1 TABLET BY  MOUTH EVERY DAY 90 tablet 0  . sertraline (ZOLOFT) 50 MG tablet Take 1 tablet (50 mg total) by  mouth daily. NEEDS OV BEFORE NEXT FILL (Patient taking differently: Take 50 mg by mouth daily. ) 90 tablet 0  . TURMERIC CURCUMIN PO Take 750 mg by mouth daily.     No facility-administered medications prior to visit.    Allergies  Allergen Reactions  . Codeine Nausea And Vomiting    Review of Systems  Constitutional: Negative for fever and malaise/fatigue.  HENT: Negative for congestion.   Eyes: Negative for blurred vision.  Respiratory: Negative for shortness of breath.   Cardiovascular: Negative for chest pain, palpitations and leg swelling.  Gastrointestinal: Negative for abdominal pain, blood in stool and nausea.  Genitourinary: Negative for dysuria and frequency.  Musculoskeletal: Positive for joint pain. Negative for falls.  Skin: Negative for rash.  Neurological: Negative for dizziness, loss of consciousness and headaches.  Endo/Heme/Allergies: Negative for environmental allergies.  Psychiatric/Behavioral: Negative for depression. The patient is not nervous/anxious.        Objective:    Physical Exam Constitutional:      Appearance: Normal appearance. She is not ill-appearing.  HENT:     Head: Normocephalic and atraumatic.     Nose: Nose normal.  Eyes:     General:        Right eye: No discharge.        Left eye: No discharge.  Pulmonary:     Effort: Pulmonary effort is normal.  Neurological:     Mental Status: She is alert and oriented to person, place, and time.  Psychiatric:        Behavior: Behavior normal.     There were no vitals taken for this visit. Wt Readings from Last 3 Encounters:  02/12/20 180 lb (81.6 kg)  11/16/18 178 lb 6.4 oz (80.9 kg)  04/13/18 181 lb 3.2 oz (82.2 kg)    Diabetic Foot Exam - Simple   No data filed     Lab Results  Component Value Date   WBC 6.3 02/12/2020   HGB 11.5 (L) 02/12/2020   HCT 35.9 (L) 02/12/2020   PLT 317  02/12/2020   GLUCOSE 93 02/12/2020   CHOL 170 01/26/2019   TRIG 171.0 (H) 01/26/2019   HDL 52.40 01/26/2019   LDLDIRECT 86.0 04/13/2018   LDLCALC 83 01/26/2019   ALT 21 01/26/2019   AST 22 01/26/2019   NA 137 02/12/2020   K 4.6 02/12/2020   CL 104 02/12/2020   CREATININE 0.76 02/12/2020   BUN 12 02/12/2020   CO2 22 02/12/2020   TSH 2.91 01/26/2019    Lab Results  Component Value Date   TSH 2.91 01/26/2019   Lab Results  Component Value Date   WBC 6.3 02/12/2020   HGB 11.5 (L) 02/12/2020   HCT 35.9 (L) 02/12/2020   MCV 93.7 02/12/2020   PLT 317 02/12/2020   Lab Results  Component Value Date   NA 137 02/12/2020   K 4.6 02/12/2020   CO2 22 02/12/2020   GLUCOSE 93 02/12/2020   BUN 12 02/12/2020   CREATININE 0.76 02/12/2020   BILITOT 0.6 01/26/2019   ALKPHOS 64 01/26/2019   AST 22 01/26/2019   ALT 21 01/26/2019   PROT 6.7 01/26/2019   ALBUMIN 4.2 01/26/2019   CALCIUM 8.9 02/12/2020   ANIONGAP 11 02/12/2020   GFR 79.42 01/26/2019   Lab Results  Component Value Date   CHOL 170 01/26/2019   Lab Results  Component Value Date   HDL 52.40 01/26/2019   Lab Results  Component Value Date   LDLCALC 83  01/26/2019   Lab Results  Component Value Date   TRIG 171.0 (H) 01/26/2019   Lab Results  Component Value Date   CHOLHDL 3 01/26/2019   No results found for: HGBA1C     Assessment & Plan:   Problem List Items Addressed This Visit    Depression with anxiety    Doing well on Sertraline 50 mg daily.       Hypertension    Monitor and report any concerns. no changes to meds. Encouraged heart healthy diet such as the DASH diet and exercise as tolerated.       Relevant Orders   CBC   Comprehensive metabolic panel   TSH   Hyperlipidemia    Encouraged heart healthy diet, increase exercise, avoid trans fats, consider a krill oil cap daily      Relevant Orders   Lipid panel   Left elbow fracture    She fell while on a trip to [place] and she was  able to return and have surgery with dr Amedeo Plenty at [facility].        Other Visit Diagnoses    Estrogen deficiency    -  Primary   Relevant Orders   DG Bone Density   Post-menopausal       Relevant Orders   DG Bone Density   Colon cancer screening       Relevant Orders   Ambulatory referral to Gastroenterology   Encounter for screening for malignant neoplasm of breast, unspecified screening modality       Relevant Orders   MM 3D SCREEN BREAST BILATERAL   H/O colonoscopy with polypectomy          I am having Nathaniel Man maintain her Melatonin, meclizine, buPROPion, sertraline, aspirin EC, Krill Oil, niacin, vitamin C, Vitamin D3, TURMERIC CURCUMIN PO, oxyCODONE, methocarbamol, magnesium oxide, metoprolol succinate, and rosuvastatin.  No orders of the defined types were placed in this encounter.    I discussed the assessment and treatment plan with the patient. The patient was provided an opportunity to ask questions and all were answered. The patient agreed with the plan and de minutes of non-face-to-face time during this encounter.   Penni Homans, MD

## 2020-03-13 DIAGNOSIS — M25522 Pain in left elbow: Secondary | ICD-10-CM | POA: Diagnosis not present

## 2020-03-20 DIAGNOSIS — Z4789 Encounter for other orthopedic aftercare: Secondary | ICD-10-CM | POA: Diagnosis not present

## 2020-03-20 DIAGNOSIS — M25522 Pain in left elbow: Secondary | ICD-10-CM | POA: Diagnosis not present

## 2020-03-20 DIAGNOSIS — S42402D Unspecified fracture of lower end of left humerus, subsequent encounter for fracture with routine healing: Secondary | ICD-10-CM | POA: Diagnosis not present

## 2020-03-22 ENCOUNTER — Other Ambulatory Visit: Payer: Self-pay | Admitting: Family Medicine

## 2020-03-23 DIAGNOSIS — M25522 Pain in left elbow: Secondary | ICD-10-CM | POA: Diagnosis not present

## 2020-03-27 DIAGNOSIS — M25522 Pain in left elbow: Secondary | ICD-10-CM | POA: Diagnosis not present

## 2020-04-02 DIAGNOSIS — Z20822 Contact with and (suspected) exposure to covid-19: Secondary | ICD-10-CM | POA: Diagnosis not present

## 2020-04-04 ENCOUNTER — Encounter: Payer: Self-pay | Admitting: Family Medicine

## 2020-04-04 NOTE — Telephone Encounter (Signed)
Patient scheduled tomorrow with Lenna Sciara

## 2020-04-05 ENCOUNTER — Telehealth (INDEPENDENT_AMBULATORY_CARE_PROVIDER_SITE_OTHER): Payer: PPO | Admitting: Family

## 2020-04-05 ENCOUNTER — Other Ambulatory Visit: Payer: Self-pay

## 2020-04-05 VITALS — BP 134/84 | HR 73 | Ht 64.0 in | Wt 172.0 lb

## 2020-04-05 DIAGNOSIS — U071 COVID-19: Secondary | ICD-10-CM

## 2020-04-05 NOTE — Progress Notes (Signed)
Virtual Visit via Video Note  I connected with Brittany Salinas on 04/05/20 at 10:20 AM EST by a video enabled telemedicine application and verified that I am speaking with the correct person using two identifiers.  Location: Patient: home Provider: home   I discussed the limitations of evaluation and management by telemedicine and the availability of in person appointments. The patient expressed understanding and agreed to proceed. Only the patient and myself were present for today's video call.   History of Present Illness  Patient is a 69 yr old female who presents today to discuss recent covid-19 diagnosis.  She reports that she had a potential covid exposure at church on 03/26/20.  On 11/25 she developed a sore throat and myalgias.  On 11/26 she lost taste and smell.  She initially thought that it was just a cold.  She went to [facility] on 04/02/20 and received a PCR COVID test and results returned positive.  Reports very mild sob which she reports is likely due to some swelling in her throat- but is mild.  She does report some chest congestion and cough.  Denies fever.   She had pfizer vaccines x 2.  She has not had a booster.    Observations/Objective:   Gen: Awake, alert, no acute distress Resp: Breathing is even and non-labored Psych: calm/pleasant demeanor Neuro: Alert and Oriented x 3, + facial symmetry, speech is clear.   Assessment and Plan:  COVID-19 infection- recommended referral for monoclonal antibody infusion since she is within the 10 day window. We discussed use of delsym as needed for cough and mucinex as needed for chest congestion. We discussed need to quarantine x 10 days. Advised of CDC guidelines for self isolation/ ending isolation.  Advised of safe practice guidelines. Symptom Tier reviewed.  Encouraged to monitor for any worsening symptoms; watch for increased shortness of breath, weakness, and signs of dehydration. Advised when to seek emergency care.   Instructed to rest and hydrate well.  Advised to leave the house during recommended isolation period, only if it is necessary to seek medical care   Follow Up Instructions:    I discussed the assessment and treatment plan with the patient. The patient was provided an opportunity to ask questions and all were answered. The patient agreed with the plan and demonstrated an understanding of the instructions.   The patient was advised to call back or seek an in-person evaluation if the symptoms worsen or if the condition fails to improve as anticipated.  Nance Pear, NP

## 2020-04-06 ENCOUNTER — Other Ambulatory Visit: Payer: Self-pay | Admitting: Nurse Practitioner

## 2020-04-06 ENCOUNTER — Ambulatory Visit (HOSPITAL_COMMUNITY)
Admission: RE | Admit: 2020-04-06 | Discharge: 2020-04-06 | Disposition: A | Discharge status: Home / Self Care | Payer: PPO | Source: Ambulatory Visit | Attending: Pulmonary Disease | Admitting: Pulmonary Disease

## 2020-04-06 DIAGNOSIS — I10 Essential (primary) hypertension: Secondary | ICD-10-CM | POA: Insufficient documentation

## 2020-04-06 DIAGNOSIS — U071 COVID-19: Secondary | ICD-10-CM | POA: Diagnosis present

## 2020-04-06 DIAGNOSIS — Z23 Encounter for immunization: Secondary | ICD-10-CM | POA: Insufficient documentation

## 2020-04-06 MED ORDER — SODIUM CHLORIDE 0.9 % IV SOLN: INTRAVENOUS | Status: DC | PRN

## 2020-04-06 MED ORDER — SODIUM CHLORIDE 0.9 % IV SOLN: 1200.0000 mg | Freq: Once | INTRAVENOUS | Status: DC

## 2020-04-06 MED ORDER — SOTROVIMAB 500 MG/8ML IV SOLN
500.0000 mg | Freq: Once | INTRAVENOUS | Status: AC
  Administered 2020-04-06: 500 mg via INTRAVENOUS

## 2020-04-06 MED ORDER — FAMOTIDINE IN NACL 20-0.9 MG/50ML-% IV SOLN: 20.0000 mg | Freq: Once | INTRAVENOUS | Status: DC | PRN

## 2020-04-06 MED ORDER — METHYLPREDNISOLONE SODIUM SUCC 125 MG IJ SOLR: 125.0000 mg | Freq: Once | INTRAMUSCULAR | Status: DC | PRN

## 2020-04-06 MED ORDER — ALBUTEROL SULFATE HFA 108 (90 BASE) MCG/ACT IN AERS: 2.0000 | INHALATION_SPRAY | Freq: Once | RESPIRATORY_TRACT | Status: DC | PRN

## 2020-04-06 MED ORDER — EPINEPHRINE 0.3 MG/0.3ML IJ SOAJ: 0.3000 mg | Freq: Once | INTRAMUSCULAR | Status: DC | PRN

## 2020-04-06 MED ORDER — DIPHENHYDRAMINE HCL 50 MG/ML IJ SOLN: 50.0000 mg | Freq: Once | INTRAMUSCULAR | Status: DC | PRN

## 2020-04-06 NOTE — Discharge Instructions (Signed)
10 Things You Can Do to Manage Your COVID-19 Symptoms at Home If you have possible or confirmed COVID-19: 1. Stay home from work and school. And stay away from other public places. If you must go out, avoid using any kind of public transportation, ridesharing, or taxis. 2. Monitor your symptoms carefully. If your symptoms get worse, call your healthcare provider immediately. 3. Get rest and stay hydrated. 4. If you have a medical appointment, call the healthcare provider ahead of time and tell them that you have or may have COVID-19. 5. For medical emergencies, call 911 and notify the dispatch personnel that you have or may have COVID-19. 6. Cover your cough and sneezes with a tissue or use the inside of your elbow. 7. Wash your hands often with soap and water for at least 20 seconds or clean your hands with an alcohol-based hand sanitizer that contains at least 60% alcohol. 8. As much as possible, stay in a specific room and away from other people in your home. Also, you should use a separate bathroom, if available. If you need to be around other people in or outside of the home, wear a mask. 9. Avoid sharing personal items with other people in your household, like dishes, towels, and bedding. 10. Clean all surfaces that are touched often, like counters, tabletops, and doorknobs. Use household cleaning sprays or wipes according to the label instructions. cdc.gov/coronavirus 11/04/2018 This information is not intended to replace advice given to you by your health care provider. Make sure you discuss any questions you have with your health care provider. Document Revised: 04/08/2019 Document Reviewed: 04/08/2019 Elsevier Patient Education  2020 Elsevier Inc. What types of side effects do monoclonal antibody drugs cause?  Common side effects  In general, the more common side effects caused by monoclonal antibody drugs include: . Allergic reactions, such as hives or itching . Flu-like signs and  symptoms, including chills, fatigue, fever, and muscle aches and pains . Nausea, vomiting . Diarrhea . Skin rashes . Low blood pressure   The CDC is recommending patients who receive monoclonal antibody treatments wait at least 90 days before being vaccinated.  Currently, there are no data on the safety and efficacy of mRNA COVID-19 vaccines in persons who received monoclonal antibodies or convalescent plasma as part of COVID-19 treatment. Based on the estimated half-life of such therapies as well as evidence suggesting that reinfection is uncommon in the 90 days after initial infection, vaccination should be deferred for at least 90 days, as a precautionary measure until additional information becomes available, to avoid interference of the antibody treatment with vaccine-induced immune responses. If you have any questions or concerns after the infusion please call the Advanced Practice Provider on call at [phone]. This number is ONLY intended for your use regarding questions or concerns about the infusion post-treatment side-effects.  Please do not provide this number to others for use. For return to work notes please contact your primary care provider.   If someone you know is interested in receiving treatment please have them call the COVID hotline at [phone].   

## 2020-04-06 NOTE — Progress Notes (Signed)
Diagnosis: COVID-19  Physician: Dr. Patrick Wright  Procedure: Covid Infusion Clinic Med: Sotrovimab infusion - Provided patient with sotrovimab fact sheet for patients, parents, and caregivers prior to infusion.   Complications: No immediate complications noted  Discharge: Discharged home    

## 2020-04-06 NOTE — Progress Notes (Signed)
Patient reviewed Fact Sheet for Patients, Parents, and Caregivers for Emergency Use Authorization (EUA) of Sotrovimab for the Treatment of Coronavirus. Patient also reviewed and is agreeable to the estimated cost of treatment. Patient is agreeable to proceed.   

## 2020-04-06 NOTE — Progress Notes (Signed)
I connected by phone with Brittany Salinas on 04/06/2020 at 3:30 PM to discuss the potential use of a new treatment for mild to moderate COVID-19 viral infection in non-hospitalized patients.  This patient is a 69 y.o. female that meets the FDA criteria for Emergency Use Authorization of COVID monoclonal antibody casirivimab/imdevimab, bamlanivimab/eteseviamb, or sotrovimab.  Has a (+) direct SARS-CoV-2 viral test result  Has mild or moderate COVID-19   Is NOT hospitalized due to COVID-19  Is within 10 days of symptom onset  Has at least one of the high risk factor(s) for progression to severe COVID-19 and/or hospitalization as defined in EUA.  Specific high risk criteria : BMI > 25 and Cardiovascular disease or hypertension   I have spoken and communicated the following to the patient or parent/caregiver regarding COVID monoclonal antibody treatment:  1. FDA has authorized the emergency use for the treatment of mild to moderate COVID-19 in adults and pediatric patients with positive results of direct SARS-CoV-2 viral testing who are 14 years of age and older weighing at least 40 kg, and who are at high risk for progressing to severe COVID-19 and/or hospitalization.  2. The significant known and potential risks and benefits of COVID monoclonal antibody, and the extent to which such potential risks and benefits are unknown.  3. Information on available alternative treatments and the risks and benefits of those alternatives, including clinical trials.  4. Patients treated with COVID monoclonal antibody should continue to self-isolate and use infection control measures (e.g., wear mask, isolate, social distance, avoid sharing personal items, clean and disinfect "high touch" surfaces, and frequent handwashing) according to CDC guidelines.   5. The patient or parent/caregiver has the option to accept or refuse COVID monoclonal antibody treatment.  After reviewing this information with  the patient, the patient has agreed to receive one of the available covid 19 monoclonal antibodies and will be provided an appropriate fact sheet prior to infusion. Jobe Gibbon, NP 04/06/2020 3:30 PM

## 2020-04-09 ENCOUNTER — Other Ambulatory Visit: Payer: Self-pay | Admitting: Family Medicine

## 2020-04-12 ENCOUNTER — Other Ambulatory Visit: Payer: Self-pay | Admitting: Family Medicine

## 2020-04-12 MED ORDER — ROSUVASTATIN CALCIUM 10 MG PO TABS
10.0000 mg | ORAL_TABLET | Freq: Every day | ORAL | 1 refills | Status: DC
Start: 2020-04-12 — End: 2020-12-18

## 2020-04-13 DIAGNOSIS — M25522 Pain in left elbow: Secondary | ICD-10-CM | POA: Diagnosis not present

## 2020-04-17 DIAGNOSIS — Z4789 Encounter for other orthopedic aftercare: Secondary | ICD-10-CM | POA: Diagnosis not present

## 2020-04-17 DIAGNOSIS — M25522 Pain in left elbow: Secondary | ICD-10-CM | POA: Diagnosis not present

## 2020-04-18 ENCOUNTER — Ambulatory Visit
Admission: RE | Admit: 2020-04-18 | Discharge: 2020-04-18 | Disposition: A | Discharge status: Home / Self Care | Payer: PPO | Source: Ambulatory Visit | Attending: Family Medicine | Admitting: Family Medicine

## 2020-04-18 ENCOUNTER — Other Ambulatory Visit: Payer: Self-pay

## 2020-04-18 DIAGNOSIS — Z1231 Encounter for screening mammogram for malignant neoplasm of breast: Secondary | ICD-10-CM | POA: Diagnosis not present

## 2020-04-18 DIAGNOSIS — Z1239 Encounter for other screening for malignant neoplasm of breast: Secondary | ICD-10-CM

## 2020-04-19 DIAGNOSIS — M25522 Pain in left elbow: Secondary | ICD-10-CM | POA: Diagnosis not present

## 2020-05-10 DIAGNOSIS — M25522 Pain in left elbow: Secondary | ICD-10-CM | POA: Diagnosis not present

## 2020-05-16 DIAGNOSIS — M25522 Pain in left elbow: Secondary | ICD-10-CM | POA: Diagnosis not present

## 2020-05-17 ENCOUNTER — Encounter: Payer: Self-pay | Admitting: Family Medicine

## 2020-05-24 DIAGNOSIS — M25522 Pain in left elbow: Secondary | ICD-10-CM | POA: Diagnosis not present

## 2020-05-29 DIAGNOSIS — M25522 Pain in left elbow: Secondary | ICD-10-CM | POA: Diagnosis not present

## 2020-05-29 DIAGNOSIS — S42402D Unspecified fracture of lower end of left humerus, subsequent encounter for fracture with routine healing: Secondary | ICD-10-CM | POA: Diagnosis not present

## 2020-06-07 ENCOUNTER — Other Ambulatory Visit: Payer: Self-pay | Admitting: Family Medicine

## 2020-07-19 ENCOUNTER — Other Ambulatory Visit: Payer: Self-pay

## 2020-07-19 ENCOUNTER — Ambulatory Visit
Admission: RE | Admit: 2020-07-19 | Discharge: 2020-07-19 | Disposition: A | Discharge status: Home / Self Care | Payer: PPO | Source: Ambulatory Visit | Attending: Family Medicine | Admitting: Family Medicine

## 2020-07-19 DIAGNOSIS — Z780 Asymptomatic menopausal state: Secondary | ICD-10-CM

## 2020-07-19 DIAGNOSIS — M8589 Other specified disorders of bone density and structure, multiple sites: Secondary | ICD-10-CM | POA: Diagnosis not present

## 2020-07-19 DIAGNOSIS — E2839 Other primary ovarian failure: Secondary | ICD-10-CM

## 2020-07-31 ENCOUNTER — Telehealth: Payer: Self-pay | Admitting: Gastroenterology

## 2020-07-31 NOTE — Telephone Encounter (Signed)
Hi Dr. Bryan Lemma,   We received a referral for repeat colonoscopy. Patient had one done in 2013 over at [facility]. It states she had a large adenoma polyp removed and was recommended to have a recall within 3 yrs.  Please advise on scheduling.   Thank you

## 2020-08-24 DIAGNOSIS — M13842 Other specified arthritis, left hand: Secondary | ICD-10-CM | POA: Diagnosis not present

## 2020-08-24 DIAGNOSIS — M654 Radial styloid tenosynovitis [de Quervain]: Secondary | ICD-10-CM | POA: Diagnosis not present

## 2020-08-24 DIAGNOSIS — Z4789 Encounter for other orthopedic aftercare: Secondary | ICD-10-CM | POA: Diagnosis not present

## 2020-08-24 NOTE — Telephone Encounter (Signed)
Per Dr Bryan Lemma, ok to schedule colonoscopy  Left msg on pt's vm to call back  Will place record in cabinet folder @[facility] 

## 2020-10-06 ENCOUNTER — Other Ambulatory Visit: Payer: Self-pay | Admitting: Family Medicine

## 2020-10-31 DIAGNOSIS — Z961 Presence of intraocular lens: Secondary | ICD-10-CM | POA: Diagnosis not present

## 2020-10-31 DIAGNOSIS — H04123 Dry eye syndrome of bilateral lacrimal glands: Secondary | ICD-10-CM | POA: Diagnosis not present

## 2020-10-31 DIAGNOSIS — H524 Presbyopia: Secondary | ICD-10-CM | POA: Diagnosis not present

## 2020-11-07 ENCOUNTER — Encounter: Payer: Self-pay | Admitting: *Deleted

## 2020-12-12 ENCOUNTER — Other Ambulatory Visit: Payer: Self-pay | Admitting: Family Medicine

## 2020-12-18 ENCOUNTER — Other Ambulatory Visit: Payer: Self-pay | Admitting: Family Medicine

## 2020-12-20 DIAGNOSIS — N390 Urinary tract infection, site not specified: Secondary | ICD-10-CM | POA: Diagnosis not present

## 2021-01-17 ENCOUNTER — Other Ambulatory Visit: Payer: Self-pay | Admitting: Family Medicine

## 2021-02-02 ENCOUNTER — Other Ambulatory Visit: Payer: Self-pay | Admitting: Family Medicine

## 2021-03-12 DIAGNOSIS — J029 Acute pharyngitis, unspecified: Secondary | ICD-10-CM | POA: Diagnosis not present

## 2021-03-12 DIAGNOSIS — K146 Glossodynia: Secondary | ICD-10-CM | POA: Diagnosis not present

## 2021-03-15 ENCOUNTER — Other Ambulatory Visit: Payer: Self-pay

## 2021-03-15 ENCOUNTER — Telehealth (INDEPENDENT_AMBULATORY_CARE_PROVIDER_SITE_OTHER): Payer: PPO | Admitting: Family Medicine

## 2021-03-15 ENCOUNTER — Telehealth: Payer: Self-pay

## 2021-03-15 ENCOUNTER — Encounter: Payer: Self-pay | Admitting: Family Medicine

## 2021-03-15 DIAGNOSIS — I10 Essential (primary) hypertension: Secondary | ICD-10-CM

## 2021-03-15 DIAGNOSIS — E785 Hyperlipidemia, unspecified: Secondary | ICD-10-CM | POA: Diagnosis not present

## 2021-03-15 DIAGNOSIS — U071 COVID-19: Secondary | ICD-10-CM | POA: Diagnosis not present

## 2021-03-15 MED ORDER — ONDANSETRON HCL 4 MG PO TABS: 4.0000 mg | ORAL_TABLET | Freq: Three times a day (TID) | ORAL | 1 refills | Status: DC | PRN

## 2021-03-15 MED ORDER — MAGIC MOUTHWASH W/LIDOCAINE: 5.0000 mL | Freq: Four times a day (QID) | ORAL | 0 refills | Status: DC | PRN

## 2021-03-15 NOTE — Assessment & Plan Note (Addendum)
Well controlled per patient, no changes to meds. Encouraged heart healthy diet such as the DASH diet and exercise as tolerated.

## 2021-03-15 NOTE — Telephone Encounter (Signed)
Appointments made for 04/04/21 and 08/30/21

## 2021-03-15 NOTE — Telephone Encounter (Signed)
Called pt. Lvm to call in to schedule appointments physical and labs in 2 weeks.

## 2021-03-15 NOTE — Progress Notes (Signed)
MyChart Video Visit    Virtual Visit via Video Note   This visit type was conducted due to national recommendations for restrictions regarding the COVID-19 Pandemic (e.g. social distancing) in an effort to limit this patient's exposure and mitigate transmission in our community. This patient is at least at moderate risk for complications without adequate follow up. This format is felt to be most appropriate for this patient at this time. Physical exam was limited by quality of the video and audio technology used for the visit. S. Chism, CMA was able to get the patient set up on a video visit.  Patient location: Home Patient and provider in visit Provider location: Office  I discussed the limitations of evaluation and management by telemedicine and the availability of in person appointments. The patient expressed understanding and agreed to proceed.  Visit Date: 03/16/21  Today's healthcare provider: Penni Homans, MD     Subjective:    Patient ID: Brittany Salinas, female    DOB: 1950-11-29, 70 y.o.   MRN: [id number]  Chief Complaint  Patient presents with   Covid Positive    HPI Patient is in today for virtual video visit for follow up on htn and recent . She reports onset of sickness was a week ago. She endorses having symptoms of chills, congestion, coughs, fatigue, nausea, poor appetite, and by last Friday had a sore throat. She had to go to local urgent care and was started on penicillin for suspicion of strep infection. She is still waiting on culture lab results. Today she tested positive for covid using an in-home test and endorses SPO2 of 97% with good BP and pulse readings. Denies CP/palp/SOB/HA/fevers or GU c/o. Taking meds as prescribed.   Past Medical History:  Diagnosis Date   Arthritis    OA   Cataracts, both eyes    Depression    Family history of adverse reaction to anesthesia    mother - slow to awaken   Hyperlipidemia    Hypertension     Insomnia    Memory loss    Right foot pain 10/09/2017   Vision abnormalities     Past Surgical History:  Procedure Laterality Date   BREAST BIOPSY Right    benign   BREAST REDUCTION SURGERY  2007   CESAREAN SECTION     4 c-sections   COLONOSCOPY W/ POLYPECTOMY     EYE SURGERY Bilateral    catarcts   ORIF ELBOW FRACTURE Left 02/12/2020   Procedure: Left elbow olecranon osteotomy, ulnar nerve release, and surgical reconstruction distal end of the humerus with repair;  Surgeon: Roseanne Kaufman, MD;  Location: [place];  Service: Orthopedics;  Laterality: Left;  2.5 hrs   REDUCTION MAMMAPLASTY Bilateral    ROTATOR CUFF REPAIR Bilateral    right in 2010, left in 2017.   TONSILLECTOMY      Family History  Problem Relation Age of Onset   Heart defect Mother        valvular heart disease   Heart attack Father    Hyperlipidemia Father    Hypertension Father    Breast cancer Maternal Aunt    Marfan syndrome Sister    Hypertension Brother    Other Brother        vertigo   Psoriasis Daughter    Mental illness Son        depression alcohol in past   Depression Son    Cancer Maternal Grandmother  stomach   Stroke Maternal Grandfather    Marfan syndrome Maternal Grandfather        ?   Arrhythmia Son    Alcohol abuse Son        h/o drug abuse    Social History   Socioeconomic History   Marital status: Married    Spouse name: Not on file   Number of children: Not on file   Years of education: Not on file   Highest education level: Not on file  Occupational History   Not on file  Tobacco Use   Smoking status: Never   Smokeless tobacco: Never  Vaping Use   Vaping Use: Never used  Substance and Sexual Activity   Alcohol use: Yes    Alcohol/week: 2.0 standard drinks    Types: 2 Glasses of wine per week   Drug use: No   Sexual activity: Not on file  Other Topics Concern   Not on file  Social History Narrative   Works as Geophysicist/field seismologist, lives with husband and  dog, no dietary, no cigarettes, drug use. Uses occasional light alcohol   Social Determinants of Health   Financial Resource Strain: Not on file  Food Insecurity: Not on file  Transportation Needs: Not on file  Physical Activity: Not on file  Stress: Not on file  Social Connections: Not on file  Intimate Partner Violence: Not on file    Outpatient Medications Prior to Visit  Medication Sig Dispense Refill   Ascorbic Acid (VITAMIN C) 1000 MG tablet Take 1,000 mg by mouth daily.     aspirin EC 81 MG tablet Take 81 mg by mouth daily. Swallow whole.     buPROPion (WELLBUTRIN XL) 300 MG 24 hr tablet TAKE 1 TABLET BY MOUTH EVERY DAY 90 tablet 0   Cholecalciferol (VITAMIN D3) 125 MCG (5000 UT) TABS Take 5,000 Units by mouth daily.     Krill Oil 500 MG CAPS Take 500 mg by mouth daily.     magnesium oxide (MAG-OX) 400 MG tablet Take 400 mg by mouth at bedtime.     meclizine (ANTIVERT) 12.5 MG tablet Take 1 tablet (12.5 mg total) by mouth 3 (three) times daily as needed for dizziness. 30 tablet 0   Melatonin 10 MG TABS Take 10 mg by mouth at bedtime.     methocarbamol (ROBAXIN) 500 MG tablet Take 500 mg by mouth every 6 (six) hours as needed for muscle spasms.     metoprolol succinate (TOPROL-XL) 50 MG 24 hr tablet TAKE 1 TABLET BY MOUTH EVERY DAY 90 tablet 1   niacin 500 MG tablet Take 500 mg by mouth daily.     rosuvastatin (CRESTOR) 10 MG tablet TAKE 1 TABLET BY MOUTH EVERY DAY 90 tablet 1   sertraline (ZOLOFT) 50 MG tablet TAKE 1 TABLET BY MOUTH EVERY DAY 90 tablet 1   TURMERIC CURCUMIN PO Take 750 mg by mouth daily.     No facility-administered medications prior to visit.    Allergies  Allergen Reactions   Codeine Nausea And Vomiting    Review of Systems  Constitutional:  Positive for chills and malaise/fatigue. Negative for fever.  HENT:  Positive for congestion and sore throat. Negative for sinus pain.   Eyes:  Negative for blurred vision.  Respiratory:  Positive for cough.  Negative for shortness of breath.   Cardiovascular:  Negative for chest pain, palpitations and leg swelling.  Gastrointestinal:  Positive for nausea. Negative for blood in stool, diarrhea and vomiting.  Genitourinary:  Negative for flank pain and frequency.  Musculoskeletal:  Negative for back pain.  Skin:  Negative for rash.  Neurological:  Negative for headaches.      Objective:    Physical Exam Constitutional:      Appearance: Normal appearance.  HENT:     Head: Normocephalic and atraumatic.     Right Ear: External ear normal.     Left Ear: External ear normal.  Eyes:     Extraocular Movements: Extraocular movements intact.  Pulmonary:     Effort: Pulmonary effort is normal.  Musculoskeletal:     Cervical back: Normal range of motion and neck supple. No rigidity.  Neurological:     Mental Status: She is alert and oriented to person, place, and time.  Psychiatric:        Behavior: Behavior normal.    BP 133/78   Pulse 61   Temp (!) 97.3 F (36.3 C)   SpO2 97%  Wt Readings from Last 3 Encounters:  10/16/20 180 lb (81.6 kg)  04/05/20 172 lb (78 kg)  02/12/20 180 lb (81.6 kg)    Diabetic Foot Exam - Simple   No data filed    Lab Results  Component Value Date   WBC 6.3 02/12/2020   HGB 11.5 (L) 02/12/2020   HCT 35.9 (L) 02/12/2020   PLT 317 02/12/2020   GLUCOSE 93 02/12/2020   CHOL 170 01/26/2019   TRIG 171.0 (H) 01/26/2019   HDL 52.40 01/26/2019   LDLDIRECT 86.0 04/13/2018   LDLCALC 83 01/26/2019   ALT 21 01/26/2019   AST 22 01/26/2019   NA 137 02/12/2020   K 4.6 02/12/2020   CL 104 02/12/2020   CREATININE 0.76 02/12/2020   BUN 12 02/12/2020   CO2 22 02/12/2020   TSH 2.91 01/26/2019    Lab Results  Component Value Date   TSH 2.91 01/26/2019   Lab Results  Component Value Date   WBC 6.3 02/12/2020   HGB 11.5 (L) 02/12/2020   HCT 35.9 (L) 02/12/2020   MCV 93.7 02/12/2020   PLT 317 02/12/2020   Lab Results  Component Value Date   NA 137  02/12/2020   K 4.6 02/12/2020   CO2 22 02/12/2020   GLUCOSE 93 02/12/2020   BUN 12 02/12/2020   CREATININE 0.76 02/12/2020   BILITOT 0.6 01/26/2019   ALKPHOS 64 01/26/2019   AST 22 01/26/2019   ALT 21 01/26/2019   PROT 6.7 01/26/2019   ALBUMIN 4.2 01/26/2019   CALCIUM 8.9 02/12/2020   ANIONGAP 11 02/12/2020   GFR 79.42 01/26/2019   Lab Results  Component Value Date   CHOL 170 01/26/2019   Lab Results  Component Value Date   HDL 52.40 01/26/2019   Lab Results  Component Value Date   LDLCALC 83 01/26/2019   Lab Results  Component Value Date   TRIG 171.0 (H) 01/26/2019   Lab Results  Component Value Date   CHOLHDL 3 01/26/2019   No results found for: HGBA1C     Assessment & Plan:   Problem List Items Addressed This Visit     Hypertension    Well controlled per patient, no changes to meds. Encouraged heart healthy diet such as the DASH diet and exercise as tolerated.       Relevant Orders   CBC   Comprehensive metabolic panel   TSH   Hyperlipidemia    Encourage heart healthy diet such as MIND or DASH diet, increase exercise, avoid trans fats,  simple carbohydrates and processed foods, will check labs      Relevant Orders   Lipid panel   COVID-19     She reports onset of sickness was a week ago. She endorses having symptoms of chills, congestion, coughs, fatigue, nausea, poor appetite, and by last Friday had a sore throat. She was seen at [facility] and started on PCN but it has not helped. Today she performed a home COVID test that was positive. Her oxygen levels are in the high 90s and she reports she is feeling some better. She is too many days in to benefit from [facility]. She will let us know if her symptoms worsen and seek care if her oxygen drops below 90 or she has any severe symptoms. Her throat is very sore. She is given a prescription for magic mouthwash she can use 4 x daily. Encouraged to increase fluid intake and try eat bland food and lean proteins.  Encouraged increased rest and hydration, add probiotics, zinc such as Coldeze or Xicam. Treat fevers as needed      Relevant Medications   magic mouthwash w/lidocaine SOLN      Meds ordered this encounter  Medications   magic mouthwash w/lidocaine SOLN    Sig: Take 5 mLs by mouth 4 (four) times daily as needed for mouth pain. Swish and Spit    Dispense:  240 mL    Refill:  0    ([facility] w/lidocaine 1:1) Diphenhydramine (12.81ml/5ml) 169ml Nystatin (100,000 units/31ml), 59ml Hydrocortisone 60mg  tablet Lidocaine 2% solution, 129ml   ondansetron (ZOFRAN) 4 MG tablet    Sig: Take 1 tablet (4 mg total) by mouth every 8 (eight) hours as needed for nausea or vomiting.    Dispense:  30 tablet    Refill:  1     I discussed the assessment and treatment plan with the patient. The patient was provided an opportunity to ask questions and all were answered. The patient agreed with the plan and demonstrated an understanding of the instructions.   The patient was advised to call back or seek an in-person evaluation if the symptoms worsen or if the condition fails to improve as anticipated.  I provided 20 minutes of face-to-face time during this encounter.   Penni Homans, MD [facility] [phone] (phone) [phone] (fax)  [place], Suezanne Jacquet, acting as a scribe for Penni Homans, MD, have documented all relevent documentation on behalf of Penni Homans, MD, as directed by Penni Homans, MD while in the presence of Penni Homans, MD. DO:03/16/21.  I, Mosie Lukes, MD personally performed the services described in this documentation. All medical record entries made by the scribe were at my direction and in my presence. I have reviewed the chart and agree that the record reflects my personal performance and is accurate and complete

## 2021-03-15 NOTE — Assessment & Plan Note (Addendum)
Encourage heart healthy diet such as MIND or DASH diet, increase exercise, avoid trans fats, simple carbohydrates and processed foods, will check labs

## 2021-03-16 DIAGNOSIS — Z8616 Personal history of COVID-19: Secondary | ICD-10-CM

## 2021-03-16 DIAGNOSIS — U071 COVID-19: Secondary | ICD-10-CM | POA: Insufficient documentation

## 2021-03-16 HISTORY — DX: Personal history of COVID-19: Z86.16

## 2021-03-16 NOTE — Assessment & Plan Note (Addendum)
She reports onset of sickness was a week ago. She endorses having symptoms of chills, congestion, coughs, fatigue, nausea, poor appetite, and by last Friday had a sore throat. She was seen at [facility] and started on PCN but it has not helped. Today she performed a home COVID test that was positive. Her oxygen levels are in the high 90s and she reports she is feeling some better. She is too many days in to benefit from [facility]. She will let us know if her symptoms worsen and seek care if her oxygen drops below 90 or she has any severe symptoms. Her throat is very sore. She is given a prescription for magic mouthwash she can use 4 x daily. Encouraged to increase fluid intake and try eat bland food and lean proteins. Encouraged increased rest and hydration, add probiotics, zinc such as Coldeze or Xicam. Treat fevers as needed

## 2021-03-26 ENCOUNTER — Telehealth: Payer: Self-pay | Admitting: Family Medicine

## 2021-03-26 DIAGNOSIS — R634 Abnormal weight loss: Secondary | ICD-10-CM

## 2021-03-26 NOTE — Telephone Encounter (Signed)
Pt states she is 19 days from first day of covid symtoms. She is still unable to eat. She states she is able to eat just alittle but not enough to keep her sturdy on her feet. She also states she has lost 15lbs during this course of time. She is wanting to see if maybe she should see a nutritionist regarding this issue so she can find ways to eat. Please advise.

## 2021-03-27 ENCOUNTER — Other Ambulatory Visit: Payer: Self-pay

## 2021-03-27 DIAGNOSIS — R630 Anorexia: Secondary | ICD-10-CM

## 2021-03-28 NOTE — Telephone Encounter (Signed)
Patient notified.  She has a lab appointment on 04/04/21 that has cmp and cbc included.

## 2021-04-02 ENCOUNTER — Other Ambulatory Visit (INDEPENDENT_AMBULATORY_CARE_PROVIDER_SITE_OTHER): Payer: PPO

## 2021-04-02 ENCOUNTER — Other Ambulatory Visit: Payer: Self-pay

## 2021-04-02 ENCOUNTER — Ambulatory Visit (INDEPENDENT_AMBULATORY_CARE_PROVIDER_SITE_OTHER): Payer: PPO | Admitting: Family

## 2021-04-02 ENCOUNTER — Ambulatory Visit (HOSPITAL_BASED_OUTPATIENT_CLINIC_OR_DEPARTMENT_OTHER)
Admission: RE | Admit: 2021-04-02 | Discharge: 2021-04-02 | Disposition: A | Discharge status: Home / Self Care | Payer: PPO | Source: Ambulatory Visit | Attending: Family | Admitting: Family

## 2021-04-02 VITALS — BP 137/84 | HR 66 | Temp 98.2°F | Resp 16 | Ht 64.0 in | Wt 163.2 lb

## 2021-04-02 DIAGNOSIS — R052 Subacute cough: Secondary | ICD-10-CM

## 2021-04-02 DIAGNOSIS — E785 Hyperlipidemia, unspecified: Secondary | ICD-10-CM

## 2021-04-02 DIAGNOSIS — H6981 Other specified disorders of Eustachian tube, right ear: Secondary | ICD-10-CM

## 2021-04-02 DIAGNOSIS — I10 Essential (primary) hypertension: Secondary | ICD-10-CM | POA: Diagnosis not present

## 2021-04-02 DIAGNOSIS — U071 COVID-19: Secondary | ICD-10-CM | POA: Diagnosis not present

## 2021-04-02 DIAGNOSIS — R059 Cough, unspecified: Secondary | ICD-10-CM | POA: Diagnosis not present

## 2021-04-02 LAB — COMPREHENSIVE METABOLIC PANEL WITH GFR
ALT: 14 U/L (ref 0–35)
AST: 17 U/L (ref 0–37)
Albumin: 4.5 g/dL (ref 3.5–5.2)
Alkaline Phosphatase: 58 U/L (ref 39–117)
BUN: 11 mg/dL (ref 6–23)
CO2: 23 meq/L (ref 19–32)
Calcium: 9.5 mg/dL (ref 8.4–10.5)
Chloride: 103 meq/L (ref 96–112)
Creatinine, Ser: 0.8 mg/dL (ref 0.40–1.20)
GFR: 75.09 mL/min
Glucose, Bld: 91 mg/dL (ref 70–99)
Potassium: 4.2 meq/L (ref 3.5–5.1)
Sodium: 137 meq/L (ref 135–145)
Total Bilirubin: 0.9 mg/dL (ref 0.2–1.2)
Total Protein: 6.9 g/dL (ref 6.0–8.3)

## 2021-04-02 LAB — CBC
HCT: 38.6 % (ref 36.0–46.0)
Hemoglobin: 13 g/dL (ref 12.0–15.0)
MCHC: 33.8 g/dL (ref 30.0–36.0)
MCV: 90.2 fl (ref 78.0–100.0)
Platelets: 235 10*3/uL (ref 150.0–400.0)
RBC: 4.28 Mil/uL (ref 3.87–5.11)
RDW: 12.4 % (ref 11.5–15.5)
WBC: 7 10*3/uL (ref 4.0–10.5)

## 2021-04-02 LAB — LIPID PANEL
Cholesterol: 191 mg/dL (ref 0–200)
HDL: 43.8 mg/dL
LDL Cholesterol: 118 mg/dL — ABNORMAL HIGH (ref 0–99)
NonHDL: 147.32
Total CHOL/HDL Ratio: 4
Triglycerides: 149 mg/dL (ref 0.0–149.0)
VLDL: 29.8 mg/dL (ref 0.0–40.0)

## 2021-04-02 LAB — TSH: TSH: 2.35 u[IU]/mL (ref 0.35–5.50)

## 2021-04-02 MED ORDER — BENZONATATE 100 MG PO CAPS: 100.0000 mg | ORAL_CAPSULE | Freq: Three times a day (TID) | ORAL | 0 refills | Status: DC | PRN

## 2021-04-02 MED ORDER — PREDNISONE 10 MG PO TABS: ORAL_TABLET | ORAL | 0 refills | Status: DC

## 2021-04-02 NOTE — Assessment & Plan Note (Signed)
New. Recommended trial of flonase.

## 2021-04-02 NOTE — Patient Instructions (Signed)
Please complete lab work prior to leaving. Complete chest x-ray on the first floor. You may use tessalon as needed for cough.  Eat small frequent meals.   Call if new/worsening symptoms or if you are not at least somewhat improved in 1 week.

## 2021-04-02 NOTE — Progress Notes (Signed)
Subjective:     Patient ID: Brittany Salinas, female    DOB: 08/23/1950, 70 y.o.   MRN: [id number]  Chief Complaint  Patient presents with   Covid Positive    Patient tested positive for covid 03-15-21.    Anorexia    Loss of appetite since covid, "hard to ear, lost 18 lbs"   Cough    Still having cough and congestion   Ear Pain    Complains of right ear pain    Cough  Patient is in today for   She reports that symptoms began 11/3.  She tested positive 11/10. She also had covid 1 year ago. Symptoms started with a horrible sore throat.  She had a virtual visit with her PCP.  She developed weakness due to alteration in her sense of taste.    Today she reports that she is now starting to eat a little bit at a time.  She is eating small amounts due to the lack of flavor.  Continues to have a dry nagging cough. Energy level remains low.  She reports that she continues to have right sided ear pain which began at the beginning of covid. She reports that her breathing feels tight but she has not been wheezing.   Wt Readings from Last 3 Encounters:  04/02/21 163 lb 3.2 oz (74 kg)  10/16/20 180 lb (81.6 kg)  04/05/20 172 lb (78 kg)     Health Maintenance Due  Topic Date Due   Hepatitis C Screening  Never done   TETANUS/TDAP  Never done   Zoster Vaccines- Shingrix (1 of 2) Never done   Pneumonia Vaccine 46+ Years old (1 - PCV) Never done   COVID-19 Vaccine (4 - Booster for Pfizer series) 09/24/2020   INFLUENZA VACCINE  12/04/2020    Past Medical History:  Diagnosis Date   Arthritis    OA   Cataracts, both eyes    Depression    Family history of adverse reaction to anesthesia    mother - slow to awaken   Hyperlipidemia    Hypertension    Insomnia    Memory loss    Right foot pain 10/09/2017   Vision abnormalities     Past Surgical History:  Procedure Laterality Date   BREAST BIOPSY Right    benign   BREAST REDUCTION SURGERY  2007   CESAREAN SECTION     4  c-sections   COLONOSCOPY W/ POLYPECTOMY     EYE SURGERY Bilateral    catarcts   ORIF ELBOW FRACTURE Left 02/12/2020   Procedure: Left elbow olecranon osteotomy, ulnar nerve release, and surgical reconstruction distal end of the humerus with repair;  Surgeon: Roseanne Kaufman, MD;  Location: [place];  Service: Orthopedics;  Laterality: Left;  2.5 hrs   REDUCTION MAMMAPLASTY Bilateral    ROTATOR CUFF REPAIR Bilateral    right in 2010, left in 2017.   TONSILLECTOMY      Family History  Problem Relation Age of Onset   Heart defect Mother        valvular heart disease   Heart attack Father    Hyperlipidemia Father    Hypertension Father    Breast cancer Maternal Aunt    Marfan syndrome Sister    Hypertension Brother    Other Brother        vertigo   Psoriasis Daughter    Mental illness Son        depression alcohol in past   Depression  Son    Cancer Maternal Grandmother        stomach   Stroke Maternal Grandfather    Marfan syndrome Maternal Grandfather        ?   Arrhythmia Son    Alcohol abuse Son        h/o drug abuse    Social History   Socioeconomic History   Marital status: Married    Spouse name: Not on file   Number of children: Not on file   Years of education: Not on file   Highest education level: Not on file  Occupational History   Not on file  Tobacco Use   Smoking status: Never   Smokeless tobacco: Never  Vaping Use   Vaping Use: Never used  Substance and Sexual Activity   Alcohol use: Yes    Alcohol/week: 2.0 standard drinks    Types: 2 Glasses of wine per week   Drug use: No   Sexual activity: Not on file  Other Topics Concern   Not on file  Social History Narrative   Works as Geophysicist/field seismologist, 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 Uses occasional light alcohol   Social Determinants of Health   Financial Resource Strain: Not on file  Food Insecurity: Not on file  Transportation Needs: Not on file  Physical  Activity: Not on file  Stress: Not on file  Social Connections: Not on file  Intimate Partner Violence: Not on file    Outpatient Medications Prior to Visit  Medication Sig Dispense Refill   Ascorbic Acid (VITAMIN C) 1000 MG tablet Take 1,000 mg by mouth daily.     aspirin EC 81 MG tablet Take 81 mg by mouth daily. Swallow whole.     buPROPion (WELLBUTRIN XL) 300 MG 24 hr tablet TAKE 1 TABLET BY MOUTH EVERY DAY 90 tablet 0   Cholecalciferol (VITAMIN D3) 125 MCG (5000 UT) TABS Take 5,000 Units by mouth daily.     Krill Oil 500 MG CAPS Take 500 mg by mouth daily.     magic mouthwash w/lidocaine SOLN Take 5 mLs by mouth 4 (four) times daily as needed for mouth pain. Swish and Spit 240 mL 0   magnesium oxide (MAG-OX) 400 MG tablet Take 400 mg by mouth at bedtime.     meclizine (ANTIVERT) 12.5 MG tablet Take 1 tablet (12.5 mg total) by mouth 3 (three) times daily as needed for dizziness. 30 tablet 0   Melatonin 10 MG TABS Take 10 mg by mouth at bedtime.     metoprolol succinate (TOPROL-XL) 50 MG 24 hr tablet TAKE 1 TABLET BY MOUTH EVERY DAY 90 tablet 1   niacin 500 MG tablet Take 500 mg by mouth daily.     ondansetron (ZOFRAN) 4 MG tablet Take 1 tablet (4 mg total) by mouth every 8 (eight) hours as needed for nausea or vomiting. 30 tablet 1   rosuvastatin (CRESTOR) 10 MG tablet TAKE 1 TABLET BY MOUTH EVERY DAY 90 tablet 1   sertraline (ZOLOFT) 50 MG tablet TAKE 1 TABLET BY MOUTH EVERY DAY 90 tablet 1   TURMERIC CURCUMIN PO Take 750 mg by mouth daily.     methocarbamol (ROBAXIN) 500 MG tablet Take 500 mg by mouth every 6 (six) hours as needed for muscle spasms.     No facility-administered medications prior to visit.    Allergies  Allergen Reactions   Codeine Nausea And Vomiting    Review of Systems  Respiratory:  Positive for cough.       Objective:    Physical Exam Constitutional:      Appearance: Normal appearance. She is well-developed.     Comments: Tired appearing  HENT:      Head: Normocephalic and atraumatic.     Right Ear: Tympanic membrane and ear canal normal.     Left Ear: Tympanic membrane and ear canal normal.     Mouth/Throat:     Mouth: Mucous membranes are moist.     Pharynx: No posterior oropharyngeal erythema or uvula swelling.     Tonsils: No tonsillar exudate or tonsillar abscesses.  Cardiovascular:     Rate and Rhythm: Normal rate and regular rhythm.     Heart sounds: Normal heart sounds. No murmur heard. Pulmonary:     Effort: Pulmonary effort is normal. No respiratory distress.     Breath sounds: Normal breath sounds. No wheezing.  Neurological:     Mental Status: She is alert.  Psychiatric:        Behavior: Behavior normal.        Thought Content: Thought content normal.        Judgment: Judgment normal.    BP 137/84 (BP Location: Right Arm, Patient Position: Sitting, Cuff Size: Small)   Pulse 66   Temp 98.2 F (36.8 C) (Oral)   Resp 16   Ht 5\' 4"  (1.626 m)   Wt 163 lb 3.2 oz (74 kg)   SpO2 100%   BMI 28.01 kg/m  Wt Readings from Last 3 Encounters:  04/02/21 163 lb 3.2 oz (74 kg)  10/16/20 180 lb (81.6 kg)  04/05/20 172 lb (78 kg)       Assessment & Plan:   Problem List Items Addressed This Visit       Unprioritized   Eustachian tube dysfunction, right    New. Recommended trial of flonase.       COVID-19 virus infection - Primary    She continues to have poor appetite, weight loss, fatigue.   Pt is advised as follows:  Please complete lab work prior to leaving. Complete chest x-ray on the first floor. You may use tessalon as needed for cough and prednisone taper.  Eat small frequent meals.   Call if new/worsening symptoms or if you are not at least somewhat improved in 1 week.  Cmet, cbc, tsh today.       Other Visit Diagnoses     Subacute cough       Relevant Medications   predniSONE (DELTASONE) 10 MG tablet   benzonatate (TESSALON) 100 MG capsule   Other Relevant Orders   DG Chest 2 View        I have discontinued Aleshka W. Tocco's methocarbamol. I am also having her start on predniSONE and benzonatate. Additionally, I am having her maintain her Melatonin, meclizine, aspirin EC, Krill Oil, niacin, vitamin C, Vitamin D3, TURMERIC CURCUMIN PO, magnesium oxide, sertraline, rosuvastatin, metoprolol succinate, buPROPion, magic mouthwash w/lidocaine, and ondansetron.  Meds ordered this encounter  Medications   predniSONE (DELTASONE) 10 MG tablet    Sig: 4 tabs by mouth once daily for 2 days, then 3 tabs daily x 2 days, then 2 tabs daily x 2 days, then 1 tab daily x 2 days    Dispense:  20 tablet    Refill:  0    Order Specific Question:   Supervising Provider    Answer:   Penni Homans A [[id number]]   benzonatate (TESSALON) 100 MG capsule  Sig: Take 1 capsule (100 mg total) by mouth 3 (three) times daily as needed.    Dispense:  20 capsule    Refill:  0    Order Specific Question:   Supervising Provider    Answer:   Penni Homans A [[id number]]

## 2021-04-02 NOTE — Assessment & Plan Note (Signed)
She continues to have poor appetite, weight loss, fatigue.   Pt is advised as follows:  Please complete lab work prior to leaving. Complete chest x-ray on the first floor. You may use tessalon as needed for cough and prednisone taper.  Eat small frequent meals.   Call if new/worsening symptoms or if you are not at least somewhat improved in 1 week.  Cmet, cbc, tsh today.

## 2021-04-03 ENCOUNTER — Encounter: Payer: Self-pay | Admitting: *Deleted

## 2021-04-04 ENCOUNTER — Other Ambulatory Visit: Payer: PPO

## 2021-05-15 ENCOUNTER — Ambulatory Visit (INDEPENDENT_AMBULATORY_CARE_PROVIDER_SITE_OTHER): Payer: PPO

## 2021-05-15 VITALS — Ht 64.0 in | Wt 163.0 lb

## 2021-05-15 DIAGNOSIS — Z1211 Encounter for screening for malignant neoplasm of colon: Secondary | ICD-10-CM

## 2021-05-15 DIAGNOSIS — Z0000 Encounter for general adult medical examination without abnormal findings: Secondary | ICD-10-CM

## 2021-05-15 NOTE — Progress Notes (Signed)
Subjective:   Brittany Salinas is a 71 y.o. female who presents for an Initial Medicare Annual Wellness Visit.   I connected with Vashon today by telephone and verified that I am speaking with the correct person using two identifiers. Location patient: home Location provider: work Persons participating in the virtual visit: patient, Marine scientist.    I discussed the limitations, risks, security and privacy concerns of performing an evaluation and management service by telephone and the availability of in person appointments. I also discussed with the patient that there may be a patient responsible charge related to this service. The patient expressed understanding and verbally consented to this telephonic visit.    Interactive audio and video telecommunications were attempted between this provider and patient, however failed, due to patient having technical difficulties OR patient did not have access to video capability.  We continued and completed visit with audio only.  Some vital signs may be absent or patient reported.   Time Spent with patient on telephone encounter: 40 minutes  Review of Systems     Cardiac Risk Factors include: advanced age (>55men, >65 women);hypertension;dyslipidemia     Objective:    Today's Vitals   05/15/21 0941  Weight: 163 lb (73.9 kg)  Height: 5\' 4"  (1.626 m)  PainSc: 4    Body mass index is 27.98 kg/m.  Advanced Directives 05/15/2021 02/12/2020 01/27/2019  Does Patient Have a Medical Advance Directive? No No No  Would patient like information on creating a medical advance directive? Yes (MAU/Ambulatory/Procedural Areas - Information given) No - Patient declined Yes (MAU/Ambulatory/Procedural Areas - Information given)    Current Medications (verified) Outpatient Encounter Medications as of 05/15/2021  Medication Sig   Ascorbic Acid (VITAMIN C) 1000 MG tablet Take 1,000 mg by mouth daily.   aspirin EC 81 MG tablet Take 81 mg by mouth daily.  Swallow whole.   benzonatate (TESSALON) 100 MG capsule Take 1 capsule (100 mg total) by mouth 3 (three) times daily as needed.   buPROPion (WELLBUTRIN XL) 300 MG 24 hr tablet TAKE 1 TABLET BY MOUTH EVERY DAY   Cholecalciferol (VITAMIN D3) 125 MCG (5000 UT) TABS Take 5,000 Units by mouth daily.   Krill Oil 500 MG CAPS Take 500 mg by mouth daily.   magic mouthwash w/lidocaine SOLN Take 5 mLs by mouth 4 (four) times daily as needed for mouth pain. Swish and Spit   magnesium oxide (MAG-OX) 400 MG tablet Take 400 mg by mouth at bedtime.   meclizine (ANTIVERT) 12.5 MG tablet Take 1 tablet (12.5 mg total) by mouth 3 (three) times daily as needed for dizziness.   Melatonin 10 MG TABS Take 10 mg by mouth at bedtime.   metoprolol succinate (TOPROL-XL) 50 MG 24 hr tablet TAKE 1 TABLET BY MOUTH EVERY DAY   niacin 500 MG tablet Take 500 mg by mouth daily.   ondansetron (ZOFRAN) 4 MG tablet Take 1 tablet (4 mg total) by mouth every 8 (eight) hours as needed for nausea or vomiting.   predniSONE (DELTASONE) 10 MG tablet 4 tabs by mouth once daily for 2 days, then 3 tabs daily x 2 days, then 2 tabs daily x 2 days, then 1 tab daily x 2 days   rosuvastatin (CRESTOR) 10 MG tablet TAKE 1 TABLET BY MOUTH EVERY DAY   sertraline (ZOLOFT) 50 MG tablet TAKE 1 TABLET BY MOUTH EVERY DAY   TURMERIC CURCUMIN PO Take 750 mg by mouth daily.   No facility-administered encounter medications on file  as of 05/15/2021.    Allergies (verified) Codeine   History: Past Medical History:  Diagnosis Date   Arthritis    OA   Cataracts, both eyes    Depression    Family history of adverse reaction to anesthesia    mother - slow to awaken   Hyperlipidemia    Hypertension    Insomnia    Memory loss    Right foot pain 10/09/2017   Vision abnormalities    Past Surgical History:  Procedure Laterality Date   BREAST BIOPSY Right    benign   BREAST REDUCTION SURGERY  2007   CESAREAN SECTION     4 c-sections   COLONOSCOPY W/  POLYPECTOMY     EYE SURGERY Bilateral    catarcts   ORIF ELBOW FRACTURE Left 02/12/2020   Procedure: Left elbow olecranon osteotomy, ulnar nerve release, and surgical reconstruction distal end of the humerus with repair;  Surgeon: Roseanne Kaufman, MD;  Location: [place];  Service: Orthopedics;  Laterality: Left;  2.5 hrs   REDUCTION MAMMAPLASTY Bilateral    ROTATOR CUFF REPAIR Bilateral    right in 2010, left in 2017.   TONSILLECTOMY     Family History  Problem Relation Age of Onset   Heart defect Mother        valvular heart disease   Heart attack Father    Hyperlipidemia Father    Hypertension Father    Breast cancer Maternal Aunt    Marfan syndrome Sister    Hypertension Brother    Other Brother        vertigo   Psoriasis Daughter    Mental illness Son        depression alcohol in past   Depression Son    Cancer Maternal Grandmother        stomach   Stroke Maternal Grandfather    Marfan syndrome Maternal Grandfather        ?   Arrhythmia Son    Alcohol abuse Son        h/o drug abuse   Social History   Socioeconomic History   Marital status: Married    Spouse name: Not on file   Number of children: Not on file   Years of education: Not on file   Highest education level: Not on file  Occupational History   Not on file  Tobacco Use   Smoking status: Never   Smokeless tobacco: Never  Vaping Use   Vaping Use: Never used  Substance and Sexual Activity   Alcohol use: Yes    Alcohol/week: 2.0 standard drinks    Types: 2 Glasses of wine per week   Drug use: No   Sexual activity: Not on file  Other Topics Concern   Not on file  Social History Narrative   Works as Geophysicist/field seismologist, 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 Uses occasional light alcohol   Social Determinants of Health   Financial Resource Strain: Low Risk    Difficulty of Paying Living Expenses: Not hard at all  Food Insecurity: No Food Insecurity   Worried About Paediatric nurse in the Last Year: Never true   Ran Out of Food in the Last Year: Never true  Transportation Needs: No Transportation Needs   Lack of Transportation (Medical): No   Lack of Transportation (Non-Medical): No  Physical Activity: Insufficiently Active   Days of Exercise per Week: 3 days   Minutes of Exercise per Session:  30 min  Stress: No Stress Concern Present   Feeling of Stress : Not at all  Social Connections: Socially Integrated   Frequency of Communication with Friends and Family: More than three times a week   Frequency of Social Gatherings with Friends and Family: More than three times a week   Attends Religious Services: More than 4 times per year   Active Member of [employer] or Organizations: Yes   Attends Music therapist: More than 4 times per year   Marital Status: Married    Tobacco Counseling Counseling given: Not Answered   Clinical Intake:  Pre-visit preparation completed: Yes  Pain : 0-10 Pain Score: 4  Pain Type: Acute pain Pain Location: Leg (and hip) Pain Orientation: Right Pain Onset: 1 to 4 weeks ago Pain Frequency: Constant     BMI - recorded: 27.98 Nutritional Status: BMI 25 -29 Overweight Nutritional Risks: Unintentional weight loss (pt states due to Covid) Diabetes: No  How often do you need to have someone help you when you read instructions, pamphlets, or other written materials from your doctor or pharmacy?: 1 - Never  Diabetic?No  Interpreter Needed?: No  Information entered by :: Caroleen Hamman LPN   Activities of Daily Living In your present state of health, do you have any difficulty performing the following activities: 05/15/2021 04/02/2021  Hearing? N N  Vision? N N  Difficulty concentrating or making decisions? Y N  Comment brain fog since having Covid -  Walking or climbing stairs? N N  Dressing or bathing? N N  Doing errands, shopping? N N  Preparing Food and eating ? N -  Using the Toilet? N -  In  the past six months, have you accidently leaked urine? Y -  Do you have problems with loss of bowel control? N -  Managing your Medications? N -  Managing your Finances? N -  Housekeeping or managing your Housekeeping? N -  Some recent data might be hidden    Patient Care Team: Mosie Lukes, MD as PCP - General (Family Medicine) Teena Irani, MD (Inactive) as Consulting Physician (Gastroenterology) Jola Schmidt, MD as Consulting Physician (Ophthalmology) Reather Converse, [place] (Inactive) as Referring Physician (Chiropractic Medicine)  Indicate any recent Medical Services you may have received from other than [facility] providers in the past year (date may be approximate).     Assessment:   This is a routine wellness examination for [facility].  Hearing/Vision screen Hearing Screening - Comments:: C/o mild hearing loss Vision Screening - Comments:: Last eye exam-02/2020-Dr. 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       Depression Screen PHQ 2/9 Scores 05/15/2021 04/02/2021 04/13/2018  PHQ - 2 Score 1 0 2  PHQ- 9 Score - 0 -    Fall Risk Fall Risk  05/15/2021 04/02/2021 04/13/2018  Falls in the past year? 0 0 0  Number falls in past yr: 0 0 -  Injury with Fall? 0 0 -  Follow up Falls prevention discussed - -    FALL RISK PREVENTION PERTAINING TO THE HOME:  Any stairs in or around the home? Yes  If so, are there any without handrails? No  Home free of loose throw rugs in walkways, pet beds, electrical cords, etc? Yes  Adequate lighting in your home to reduce risk of falls? Yes   ASSISTIVE DEVICES UTILIZED TO PREVENT FALLS:  Life  alert? No  Use of a cane, walker or w/c? Yes  Grab bars in the bathroom? Yes  Shower chair or bench in shower? No  Elevated toilet seat or a handicapped toilet? No   TIMED UP AND GO:  Was the test performed? No . Phone visit   Cognitive Function:Normal cognitive status assessed by this Nurse Health Advisor. No abnormalities found.     Montreal Cognitive Assessment  05/13/2017 04/18/2016  Visuospatial/ Executive (0/5) 4 4  Naming (0/3) 3 3  Attention: Read list of digits (0/2) 2 2  Attention: Read list of letters (0/1) 1 1  Attention: Serial 7 subtraction starting at 100 (0/3) 3 3  Language: Repeat phrase (0/2) 2 2  Language : Fluency (0/1) 1 1  Abstraction (0/2) 2 2  Delayed Recall (0/5) 5 2  Orientation (0/6) 6 6  Total 29 26  Adjusted Score (based on education) 29 26      Immunizations Immunization History  Administered Date(s) Administered   Fluad Quad(high Dose 65+) 01/26/2019   PFIZER Comirnaty(Gray Top)Covid-19 Tri-Sucrose Vaccine 07/30/2020   PFIZER(Purple Top)SARS-COV-2 Vaccination 06/26/2019, 07/19/2019    TDAP status: Due, Education has been provided regarding the importance of this vaccine. Advised may receive this vaccine at local pharmacy or Health Dept. Aware to provide a copy of the vaccination record if obtained from local pharmacy or Health Dept. Verbalized acceptance and understanding.  Flu Vaccine status: Declined, Education has been provided regarding the importance of this vaccine but patient still declined. Advised may receive this vaccine at local pharmacy or Health Dept. Aware to provide a copy of the vaccination record if obtained from local pharmacy or Health Dept. Verbalized acceptance and understanding.  Pneumococcal vaccine status: Due, Education has been provided regarding the importance of this vaccine. Advised may receive this vaccine at local pharmacy or Health Dept. Aware to provide a copy of the vaccination record if obtained from local  pharmacy or Health Dept. Verbalized acceptance and understanding.  Covid-19 vaccine status: Information provided on how to obtain vaccines.   Qualifies for Shingles Vaccine? Yes   Zostavax completed No   Shingrix Completed?: No.    Education has been provided regarding the importance of this vaccine. Patient has been advised to call insurance company to determine out of pocket expense if they have not yet received this vaccine. Advised may also receive vaccine at local pharmacy or Health Dept. Verbalized acceptance and understanding.  Screening Tests Health Maintenance  Topic Date Due   Hepatitis C Screening  Never done   TETANUS/TDAP  Never done   Zoster Vaccines- Shingrix (1 of 2) Never done   Pneumonia Vaccine 72+ Years old (1 - PCV) Never done   COVID-19 Vaccine (4 - Booster for Pfizer series) 09/24/2020   INFLUENZA VACCINE  12/04/2020   COLONOSCOPY (Pts 45-62yrs Insurance coverage will need to be confirmed)  11/14/2021   MAMMOGRAM  04/18/2022   DEXA SCAN  Completed   HPV VACCINES  Aged Out    Health Maintenance  Health Maintenance Due  Topic Date Due   Hepatitis C Screening  Never done   TETANUS/TDAP  Never done   Zoster Vaccines- Shingrix (1 of 2) Never done   Pneumonia Vaccine 86+ Years old (  1 - PCV) Never done   COVID-19 Vaccine (4 - Booster for Pfizer series) 09/24/2020   INFLUENZA VACCINE  12/04/2020    Colorectal cancer screening: Type of screening: Colonoscopy. Completed 11/15/2011. Repeat every 10 years  Mammogram status: Declined today  Bone Density status: Completed 07/19/2020. Results reflect: Bone density results: OSTEOPENIA. Repeat every 2 years.  Lung Cancer Screening: (Low Dose CT Chest recommended if Age 74-80 years, 30 pack-year currently smoking OR have quit w/in 15years.) does not qualify.     Additional Screening:  Hepatitis C Screening: does qualify; Patient to discuss with PCP  Vision Screening: Recommended annual ophthalmology exams for early  detection of glaucoma and other disorders of the eye. Is the patient up to date with their annual eye exam?  Yes  Who is the provider or what is the name of the office in which the patient attends annual eye exams? Dr. Valetta Close   Dental Screening: Recommended annual dental exams for proper oral hygiene  Community Resource Referral / Chronic Care Management: CRR required this visit?  No   CCM required this visit?  No      Plan:     I have personally reviewed and noted the following in the patients chart:   Medical and social history Use of alcohol, tobacco or illicit drugs  Current medications and supplements including opioid prescriptions. Patient is not currently taking opioid prescriptions. Functional ability and status Nutritional status Physical activity Advanced directives List of other physicians Hospitalizations, surgeries, and ER visits in previous 12 months Vitals Screenings to include cognitive, depression, and falls Referrals and appointments  In addition, I have reviewed and discussed with patient certain preventive protocols, quality metrics, and best practice recommendations. A written personalized care plan for preventive services as well as general preventive health recommendations were provided to patient.   Due to this being a telephonic visit, the after visit summary with patients personalized plan was offered to patient via mail or my-chart.  Patient would like to access on my-chart.   Marta Antu, LPN   [id number]  Nurse Health Advisor  Nurse Notes: None

## 2021-05-15 NOTE — Patient Instructions (Signed)
Brittany Salinas , Thank you for taking time to complete your Medicare Wellness Visit. I appreciate your ongoing commitment to your health goals. Please review the following plan we discussed and let me know if I can assist you in the future.   Screening recommendations/referrals: Colonoscopy: Referral ordered today. Someone will call you to schedule. Mammogram: Due- Per our conversation, You will call to schedule. Bone Density: Completed 07/19/2020-Due 07/20/2022 Recommended yearly ophthalmology/optometry visit for glaucoma screening and checkup Recommended yearly dental visit for hygiene and checkup  Vaccinations: Influenza vaccine: Declined Pneumococcal vaccine: Due-May obtain vaccine at our office or your local pharmacy. Tdap vaccine: Due-May obtain vaccine at your local pharmacy. Shingles vaccine: Due-May obtain vaccine at your local pharmacy.   Covid-19:Booster available at the pharmacy  Advanced directives: Information mailed  Conditions/risks identified: See problem list  Next appointment: Follow up in one year for your annual wellness visit    Preventive Care 65 Years and Older, Female Preventive care refers to lifestyle choices and visits with your health care provider that can promote health and wellness. What does preventive care include? A yearly physical exam. This is also called an annual well check. Dental exams once or twice a year. Routine eye exams. Ask your health care provider how often you should have your eyes checked. Personal lifestyle choices, including: Daily care of your teeth and gums. Regular physical activity. Eating a healthy diet. Avoiding tobacco and drug use. Limiting alcohol use. Practicing safe sex. Taking low-dose aspirin every day. Taking vitamin and mineral supplements as recommended by your health care provider. What happens during an annual well check? The services and screenings done by your health care provider during your annual well check  will depend on your age, overall health, lifestyle risk factors, and family history of disease. Counseling  Your health care provider may ask you questions about your: Alcohol use. Tobacco use. Drug use. Emotional well-being. Home and relationship well-being. Sexual activity. Eating habits. History of falls. Memory and ability to understand (cognition). Work and work Statistician. Reproductive health. Screening  You may have the following tests or measurements: Height, weight, and BMI. Blood pressure. Lipid and cholesterol levels. These may be checked every 5 years, or more frequently if you are over 69 years old. Skin check. Lung cancer screening. You may have this screening every year starting at age 66 if you have a 30-pack-year history of smoking and currently smoke or have quit within the past 15 years. Fecal occult blood test (FOBT) of the stool. You may have this test every year starting at age 70. Flexible sigmoidoscopy or colonoscopy. You may have a sigmoidoscopy every 5 years or a colonoscopy every 10 years starting at age 40. Hepatitis C blood test. Hepatitis B blood test. Sexually transmitted disease (STD) testing. Diabetes screening. This is done by checking your blood sugar (glucose) after you have not eaten for a while (fasting). You may have this done every 1-3 years. Bone density scan. This is done to screen for osteoporosis. You may have this done starting at age 76. Mammogram. This may be done every 1-2 years. Talk to your health care provider about how often you should have regular mammograms. Talk with your health care provider about your test results, treatment options, and if necessary, the need for more tests. Vaccines  Your health care provider may recommend certain vaccines, such as: Influenza vaccine. This is recommended every year. Tetanus, diphtheria, and acellular pertussis (Tdap, Td) vaccine. You may need a Td booster every 10  years. Zoster vaccine. You  may need this after age 60. Pneumococcal 13-valent conjugate (PCV13) vaccine. One dose is recommended after age 50. Pneumococcal polysaccharide (PPSV23) vaccine. One dose is recommended after age 96. Talk to your health care provider about which screenings and vaccines you need and how often you need them. This information is not intended to replace advice given to you by your health care provider. Make sure you discuss any questions you have with your health care provider. Document Released: 05/19/2015 Document Revised: 01/10/2016 Document Reviewed: 02/21/2015 Elsevier Interactive Patient Education  2017 [employer] Prevention in the Home Falls can cause injuries. They can happen to people of all ages. There are many things you can do to make your home safe and to help prevent falls. What can I do on the outside of my home? Regularly fix the edges of walkways and driveways and fix any cracks. Remove anything that might make you trip as you walk through a door, such as a raised step or threshold. Trim any bushes or trees on the path to your home. Use bright outdoor lighting. Clear any walking paths of anything that might make someone trip, such as rocks or tools. Regularly check to see if handrails are loose or broken. Make sure that both sides of any steps have handrails. Any raised decks and porches should have guardrails on the edges. Have any leaves, snow, or ice cleared regularly. Use sand or salt on walking paths during winter. Clean up any spills in your garage right away. This includes oil or grease spills. What can I do in the bathroom? Use night lights. Install grab bars by the toilet and in the tub and shower. Do not use towel bars as grab bars. Use non-skid mats or decals in the tub or shower. If you need to sit down in the shower, use a plastic, non-slip stool. Keep the floor dry. Clean up any water that spills on the floor as soon as it happens. Remove soap buildup  in the tub or shower regularly. Attach bath mats securely with double-sided non-slip rug tape. Do not have throw rugs and other things on the floor that can make you trip. What can I do in the bedroom? Use night lights. Make sure that you have a light by your bed that is easy to reach. Do not use any sheets or blankets that are too big for your bed. They should not hang down onto the floor. Have a firm chair that has side arms. You can use this for support while you get dressed. Do not have throw rugs and other things on the floor that can make you trip. What can I do in the kitchen? Clean up any spills right away. Avoid walking on wet floors. Keep items that you use a lot in easy-to-reach places. If you need to reach something above you, use a strong step stool that has a grab bar. Keep electrical cords out of the way. Do not use floor polish or wax that makes floors slippery. If you must use wax, use non-skid floor wax. Do not have throw rugs and other things on the floor that can make you trip. What can I do with my stairs? Do not leave any items on the stairs. Make sure that there are handrails on both sides of the stairs and use them. Fix handrails that are broken or loose. Make sure that handrails are as long as the stairways. Check any carpeting to make sure  that it is firmly attached to the stairs. Fix any carpet that is loose or worn. Avoid having throw rugs at the top or bottom of the stairs. If you do have throw rugs, attach them to the floor with carpet tape. Make sure that you have a light switch at the top of the stairs and the bottom of the stairs. If you do not have them, ask someone to add them for you. What else can I do to help prevent falls? Wear shoes that: Do not have high heels. Have rubber bottoms. Are comfortable and fit you well. Are closed at the toe. Do not wear sandals. If you use a stepladder: Make sure that it is fully opened. Do not climb a closed  stepladder. Make sure that both sides of the stepladder are locked into place. Ask someone to hold it for you, if possible. Clearly mark and make sure that you can see: Any grab bars or handrails. First and last steps. Where the edge of each step is. Use tools that help you move around (mobility aids) if they are needed. These include: Canes. Walkers. Scooters. Crutches. Turn on the lights when you go into a dark area. Replace any light bulbs as soon as they burn out. Set up your furniture so you have a clear path. Avoid moving your furniture around. If any of your floors are uneven, fix them. If there are any pets around you, be aware of where they are. Review your medicines with your doctor. Some medicines can make you feel dizzy. This can increase your chance of falling. Ask your doctor what other things that you can do to help prevent falls. This information is not intended to replace advice given to you by your health care provider. Make sure you discuss any questions you have with your health care provider. Document Released: 02/16/2009 Document Revised: 09/28/2015 Document Reviewed: 05/27/2014 Elsevier Interactive Patient Education  2017 [employer].

## 2021-05-20 ENCOUNTER — Other Ambulatory Visit: Payer: Self-pay | Admitting: Family Medicine

## 2021-06-20 ENCOUNTER — Telehealth: Payer: Self-pay | Admitting: Family Medicine

## 2021-06-20 ENCOUNTER — Ambulatory Visit (AMBULATORY_SURGERY_CENTER): Payer: PPO | Admitting: *Deleted

## 2021-06-20 ENCOUNTER — Other Ambulatory Visit: Payer: Self-pay

## 2021-06-20 VITALS — Ht 64.0 in | Wt 168.0 lb

## 2021-06-20 DIAGNOSIS — Z86010 Personal history of colonic polyps: Secondary | ICD-10-CM

## 2021-06-20 DIAGNOSIS — Z8371 Family history of colonic polyps: Secondary | ICD-10-CM

## 2021-06-20 MED ORDER — METOPROLOL SUCCINATE ER 50 MG PO TB24: 50.0000 mg | ORAL_TABLET | Freq: Every day | ORAL | 1 refills | Status: DC

## 2021-06-20 MED ORDER — NA SULFATE-K SULFATE-MG SULF 17.5-3.13-1.6 GM/177ML PO SOLN: 1.0000 | ORAL | 0 refills | Status: DC

## 2021-06-20 NOTE — Progress Notes (Signed)

## 2021-06-20 NOTE — Telephone Encounter (Signed)
Medication sent.

## 2021-06-20 NOTE — Telephone Encounter (Signed)
Medication: metoprolol succinate (TOPROL-XL) 50 MG 24 hr tablet   Has the patient contacted their pharmacy? No.  Preferred Pharmacy: [id number] - [place], [place], [place] [address]  Phone:  [phone]  Fax:  [phone]

## 2021-06-29 ENCOUNTER — Encounter: Payer: Self-pay | Admitting: Gastroenterology

## 2021-07-04 ENCOUNTER — Ambulatory Visit (AMBULATORY_SURGERY_CENTER): Payer: PPO | Admitting: Gastroenterology

## 2021-07-04 ENCOUNTER — Encounter: Payer: Self-pay | Admitting: Gastroenterology

## 2021-07-04 ENCOUNTER — Other Ambulatory Visit: Payer: Self-pay

## 2021-07-04 VITALS — BP 133/64 | HR 56 | Temp 97.6°F | Resp 9 | Ht 64.0 in | Wt 168.0 lb

## 2021-07-04 DIAGNOSIS — D125 Benign neoplasm of sigmoid colon: Secondary | ICD-10-CM

## 2021-07-04 DIAGNOSIS — D122 Benign neoplasm of ascending colon: Secondary | ICD-10-CM | POA: Diagnosis not present

## 2021-07-04 DIAGNOSIS — Z8371 Family history of colonic polyps: Secondary | ICD-10-CM

## 2021-07-04 DIAGNOSIS — K640 First degree hemorrhoids: Secondary | ICD-10-CM

## 2021-07-04 DIAGNOSIS — Z86010 Personal history of colonic polyps: Secondary | ICD-10-CM

## 2021-07-04 MED ORDER — SODIUM CHLORIDE 0.9 % IV SOLN: 500.0000 mL | INTRAVENOUS | Status: DC

## 2021-07-04 NOTE — Progress Notes (Signed)
Called to room to assist during endoscopic procedure.  Patient ID and intended procedure confirmed with present staff. Received instructions for my participation in the procedure from the performing physician.  

## 2021-07-04 NOTE — Progress Notes (Signed)
Sedate, gd SR, tolerated procedure well, VSS, report to RN 

## 2021-07-04 NOTE — Op Note (Signed)
[place] ?Patient Name: Brittany Salinas ?Procedure Date: 07/04/2021 9:15 AM ?MRN: [id number] ?Endoscopist: Gerrit Heck , MD ?Age: 71 ?Referring MD:  ?Date of Birth: 1951/03/09 ?Gender: Female ?Account #: [id number] ?Procedure:                Colonoscopy ?Indications:              Surveillance: Personal history of adenomatous  ?                          polyps on last colonoscopy > 5 years ago ?Medicines:                Monitored Anesthesia Care ?Procedure:                Pre-Anesthesia Assessment: ?                          - Prior to the procedure, a History and Physical  ?                          was performed, and patient medications and  ?                          allergies were reviewed. The patient's tolerance of  ?                          previous anesthesia was also reviewed. The risks  ?                          and benefits of the procedure and the sedation  ?                          options and risks were discussed with the patient.  ?                          All questions were answered, and informed consent  ?                          was obtained. Prior Anticoagulants: The patient has  ?                          taken no previous anticoagulant or antiplatelet  ?                          agents. ASA Grade Assessment: II - A patient with  ?                          mild systemic disease. After reviewing the risks  ?                          and benefits, the patient was deemed in  ?                          satisfactory condition to undergo the procedure. ?  After obtaining informed consent, the colonoscope  ?                          was passed under direct vision. Throughout the  ?                          procedure, the patient's blood pressure, pulse, and  ?                          oxygen saturations were monitored continuously. The  ?                          Olympus CF-HQ190L ([id number]) Colonoscope was  ?                          introduced through the anus  and advanced to the the  ?                          cecum, identified by appendiceal orifice and  ?                          ileocecal valve. The colonoscopy was performed  ?                          without difficulty. The patient tolerated the  ?                          procedure well. The quality of the bowel  ?                          preparation was good. The ileocecal valve,  ?                          appendiceal orifice, and rectum were photographed. ?Scope In: 9:19:24 AM ?Scope Out: 9:50:28 AM ?Scope Withdrawal Time: 0 hours 21 minutes 48 seconds  ?Total Procedure Duration: 0 hours 31 minutes 4 seconds  ?Findings:                 The perianal and digital rectal examinations were  ?                          normal. ?                          Four sessile polyps were found in the ascending  ?                          colon. The polyps were 3 to 5 mm in size. These  ?                          polyps were removed with a cold snare. Resection  ?                          and retrieval were complete. Estimated blood loss  ?  was minimal. Estimated blood loss was minimal. ?                          Five sessile polyps were found in the sigmoid  ?                          colon. The polyps were 3 to 5 mm in size. These  ?                          polyps were removed with a cold snare. Resection  ?                          and retrieval were complete. Estimated blood loss  ?                          was minimal. ?                          Non-bleeding internal hemorrhoids were found during  ?                          retroflexion. The hemorrhoids were small. ?Complications:            No immediate complications. ?Estimated Blood Loss:     Estimated blood loss was minimal. ?Impression:               - Four 3 to 5 mm polyps in the ascending colon,  ?                          removed with a cold snare. Resected and retrieved. ?                          - Five 3 to 5 mm polyps in the sigmoid  colon,  ?                          removed with a cold snare. Resected and retrieved. ?                          - Non-bleeding internal hemorrhoids. ?Recommendation:           - Patient has a contact number available for  ?                          emergencies. The signs and symptoms of potential  ?                          delayed complications were discussed with the  ?                          patient. Return to normal activities tomorrow.  ?                          Written discharge instructions were provided to the  ?  patient. ?                          - Resume previous diet. ?                          - Continue present medications. ?                          - Await pathology results. ?                          - Repeat colonoscopy in 3 years for surveillance. ?                          - Return to GI clinic PRN. ?Gerrit Heck, MD ?07/04/2021 9:54:23 AM ?

## 2021-07-04 NOTE — Progress Notes (Signed)
? ?GASTROENTEROLOGY PROCEDURE H&P NOTE  ? ?Primary Care Physician: ?Mosie Lukes, MD ? ? ? ?Reason for Procedure:  Colon Cancer screening, polyp surveillance ? ?Plan:    Colonoscopy ? ?Patient is appropriate for endoscopic procedure(s) in the ambulatory ([place]) setting. ? ?The nature of the procedure, as well as the risks, benefits, and alternatives were carefully and thoroughly reviewed with the patient. Ample time for discussion and questions allowed. The patient understood, was satisfied, and agreed to proceed.  ? ? ? ?HPI: ?Brittany Salinas is a 71 y.o. female who presents for colonoscopy for routine Colon Cancer screening and ongoing polyp surveillance. Last colonoscopy was at outside facility in 2013 and notable for large tubular adenoma per limited available records. Prior to that, small adenomas on colonoscopy in 2008. Otherwise, no active GI symptoms.   ? ?Past Medical History:  ?Diagnosis Date  ? Arthritis   ? OA  ? Cataracts, both eyes   ? Depression   ? Family history of adverse reaction to anesthesia   ? mother and sister - slow to awaken  ? Hyperlipidemia   ? Hypertension   ? Insomnia   ? Memory loss   ? Right foot pain 10/09/2017  ? Vision abnormalities   ? ? ?Past Surgical History:  ?Procedure Laterality Date  ? BREAST BIOPSY Right   ? benign  ? BREAST REDUCTION SURGERY  2007  ? CESAREAN SECTION    ? 4 c-sections  ? COLONOSCOPY  2008  ? COLONOSCOPY W/ POLYPECTOMY  2013  ? EYE SURGERY Bilateral   ? catarcts  ? ORIF ELBOW FRACTURE Left 02/12/2020  ? Procedure: Left elbow olecranon osteotomy, ulnar nerve release, and surgical reconstruction distal end of the humerus with repair;  Surgeon: Roseanne Kaufman, MD;  Location: [facility];  Service: Orthopedics;  Laterality: Left;  2.5 hrs  ? REDUCTION MAMMAPLASTY Bilateral   ? ROTATOR CUFF REPAIR Bilateral   ? right in 2010, left in 2017.  ? TONSILLECTOMY    ? ? ?Prior to Admission medications   ?Medication Sig Start Date End Date Taking? Authorizing  Provider  ?Ascorbic Acid (VITAMIN C) 1000 MG tablet Take 1,000 mg by mouth daily.   Yes [provider]  ?aspirin EC 81 MG tablet Take 81 mg by mouth daily. Swallow whole.   Yes [provider]  ?buPROPion (WELLBUTRIN XL) 300 MG 24 hr tablet TAKE 1 TABLET BY MOUTH EVERY DAY 05/21/21  Yes Mosie Lukes, MD  ?Cholecalciferol (VITAMIN D3) 125 MCG (5000 UT) TABS Take 5,000 Units by mouth daily.   Yes [provider]  ?Astrid Drafts 500 MG CAPS Take 500 mg by mouth daily.   Yes [provider]  ?magnesium oxide (MAG-OX) 400 MG tablet Take 400 mg by mouth at bedtime.   Yes [provider]  ?Melatonin 10 MG TABS Take 10 mg by mouth at bedtime.   Yes [provider]  ?metoprolol succinate (TOPROL-XL) 50 MG 24 hr tablet Take 1 tablet (50 mg total) by mouth daily. Take with or immediately following a meal. 06/20/21  Yes Mosie Lukes, MD  ?niacin 500 MG tablet Take 500 mg by mouth daily.   Yes [provider]  ?rosuvastatin (CRESTOR) 10 MG tablet TAKE 1 TABLET BY MOUTH EVERY DAY 12/18/20  Yes Mosie Lukes, MD  ?sertraline (ZOLOFT) 50 MG tablet TAKE 1 TABLET BY MOUTH EVERY DAY 10/06/20  Yes Mosie Lukes, MD  ?TURMERIC CURCUMIN PO Take 750 mg by mouth daily.  Yes [provider]  ?meclizine (ANTIVERT) 12.5 MG tablet Take 1 tablet (12.5 mg total) by mouth 3 (three) times daily as needed for dizziness. 05/17/19   Mosie Lukes, MD  ? ? ?Current Outpatient Medications  ?Medication Sig Dispense Refill  ? Ascorbic Acid (VITAMIN C) 1000 MG tablet Take 1,000 mg by mouth daily.    ? aspirin EC 81 MG tablet Take 81 mg by mouth daily. Swallow whole.    ? buPROPion (WELLBUTRIN XL) 300 MG 24 hr tablet TAKE 1 TABLET BY MOUTH EVERY DAY 90 tablet 1  ? Cholecalciferol (VITAMIN D3) 125 MCG (5000 UT) TABS Take 5,000 Units by mouth daily.    ? Krill Oil 500 MG CAPS Take 500 mg by mouth daily.    ? magnesium oxide (MAG-OX) 400 MG tablet Take 400 mg by mouth at bedtime.    ?  Melatonin 10 MG TABS Take 10 mg by mouth at bedtime.    ? metoprolol succinate (TOPROL-XL) 50 MG 24 hr tablet Take 1 tablet (50 mg total) by mouth daily. Take with or immediately following a meal. 90 tablet 1  ? niacin 500 MG tablet Take 500 mg by mouth daily.    ? rosuvastatin (CRESTOR) 10 MG tablet TAKE 1 TABLET BY MOUTH EVERY DAY 90 tablet 1  ? sertraline (ZOLOFT) 50 MG tablet TAKE 1 TABLET BY MOUTH EVERY DAY 90 tablet 1  ? TURMERIC CURCUMIN PO Take 750 mg by mouth daily.    ? meclizine (ANTIVERT) 12.5 MG tablet Take 1 tablet (12.5 mg total) by mouth 3 (three) times daily as needed for dizziness. 30 tablet 0  ? ?Current Facility-Administered Medications  ?Medication Dose Route Frequency Provider Last Rate Last Admin  ? 0.9 %  sodium chloride infusion  500 mL Intravenous Continuous Abou Sterkel V, DO      ? ? ?Allergies as of 07/04/2021 - Review Complete 07/04/2021  ?Allergen Reaction Noted  ? Codeine Nausea And Vomiting 06/24/2014  ? ? ?Family History  ?Problem Relation Age of Onset  ? Colon polyps Mother   ? Heart defect Mother   ?     valvular heart disease  ? Heart attack Father   ? Hyperlipidemia Father   ? Hypertension Father   ? Colon polyps Sister   ? Marfan syndrome Sister   ? Hypertension Brother   ? Other Brother   ?     vertigo  ? Breast cancer Maternal Aunt   ? Cancer Maternal Grandmother   ?     stomach  ? Stroke Maternal Grandfather   ? Marfan syndrome Maternal Grandfather   ?     ?  ? Psoriasis Daughter   ? Mental illness Son   ?     depression alcohol in past  ? Depression Son   ? Arrhythmia Son   ? Alcohol abuse Son   ?     h/o drug abuse  ? Colon cancer Neg Hx   ? Esophageal cancer Neg Hx   ? Rectal cancer Neg Hx   ? Stomach cancer Neg Hx   ? ? ?Social History  ? ?Socioeconomic History  ? Marital status: Married  ?  Spouse name: Not on file  ? Number of children: Not on file  ? Years of education: Not on file  ? Highest education level: Not on file  ?Occupational History  ? Not on file   ?Tobacco Use  ? Smoking status: Never  ? Smokeless tobacco: Never  ?Vaping Use  ?  Vaping Use: Never used  ?Substance and Sexual Activity  ? Alcohol use: Yes  ?  Alcohol/week: 2.0 standard drinks  ?  Types: 2 Glasses of wine per week  ? Drug use: No  ? Sexual activity: Not on file  ?Other Topics Concern  ? Not on file  ?Social History Narrative  ? Works as Geophysicist/field seismologist, 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 Uses occasional light alcohol  ? ?Social Determinants of Health  ? ?Financial Resource Strain: Low Risk   ? Difficulty of Paying Living Expenses: Not hard at all  ?Food Insecurity: No Food Insecurity  ? Worried About Charity fundraiser in the Last Year: Never true  ? Ran Out of Food in the Last Year: Never true  ?Transportation Needs: No Transportation Needs  ? Lack of Transportation (Medical): No  ? Lack of Transportation (Non-Medical): No  ?Physical Activity: Insufficiently Active  ? Days of Exercise per Week: 3 days  ? Minutes of Exercise per Session: 30 min  ?Stress: No Stress Concern Present  ? Feeling of Stress : Not at all  ?Social Connections: Socially Integrated  ? Frequency of Communication with Friends and Family: More than three times a week  ? Frequency of Social Gatherings with Friends and Family: More than three times a week  ? Attends Religious Services: More than 4 times per year  ? Active Member of Clubs or Organizations: Yes  ? Attends Archivist Meetings: More than 4 times per year  ? Marital Status: Married  ?Intimate Partner Violence: Not At Risk  ? Fear of Current or Ex-Partner: No  ? Emotionally Abused: No  ? Physically Abused: No  ? Sexually Abused: No  ? ? ?Physical Exam: ?Vital signs in last 24 hours: ?@BP  (!) 182/77   Pulse 64   Temp 97.6 ?F (36.4 ?C)   Ht 5\' 4"  (1.626 m)   Wt 168 lb (76.2 kg)   SpO2 99%   BMI 28.84 kg/m?  ?GEN: NAD ?EYE: Sclerae anicteric ?ENT: MMM ?CV: Non-tachycardic ?Pulm: CTA b/l ?GI: Soft, NT/ND ?NEURO:  Alert &  Oriented x 3 ? ? ?Gerrit Heck, DO ?[facility] ? ? ?07/04/2021 9:10 AM ? ?

## 2021-07-04 NOTE — Patient Instructions (Signed)
Thank you for allowing [place] to care for you today. ?Await biopsy results, approximately 2 weeks. ?Recommend surveillance colonoscopy in 3 years. ?Resume previous diet and medications today. ?Return to normal daily activities tomorrow. ? ?YOU HAD AN ENDOSCOPIC PROCEDURE TODAY AT [facility]:   Refer to the procedure report that was given to you for any specific questions about what was found during the examination.  If the procedure report does not answer your questions, please call your gastroenterologist to clarify.  If you requested that your care partner not be given the details of your procedure findings, then the procedure report has been included in a sealed envelope for you to review at your convenience later. ? ?YOU SHOULD EXPECT: Some feelings of bloating in the abdomen. Passage of more gas than usual.  Walking can help get rid of the air that was put into your GI tract during the procedure and reduce the bloating. If you had a lower endoscopy (such as a colonoscopy or flexible sigmoidoscopy) you may notice spotting of blood in your stool or on the toilet paper. If you underwent a bowel prep for your procedure, you may not have a normal bowel movement for a few days. ? ?Please Note:  You might notice some irritation and congestion in your nose or some drainage.  This is from the oxygen used during your procedure.  There is no need for concern and it should clear up in a day or so. ? ?SYMPTOMS TO REPORT IMMEDIATELY: ? ?Following lower endoscopy (colonoscopy or flexible sigmoidoscopy): ? Excessive amounts of blood in the stool ? Significant tenderness or worsening of abdominal pains ? Swelling of the abdomen that is new, acute ? Fever of 100?F or higher ? ? ?For urgent or emergent issues, a gastroenterologist can be reached at any hour by calling [phone]. ?Do not use MyChart messaging for urgent concerns.  ? ? ?DIET:  We do recommend a small meal at first, but then you may proceed to  your regular diet.  Drink plenty of fluids but you should avoid alcoholic beverages for 24 hours. ? ?ACTIVITY:  You should plan to take it easy for the rest of today and you should NOT DRIVE or use heavy machinery until tomorrow (because of the sedation medicines used during the test).   ? ?FOLLOW UP: ?Our staff will call the number listed on your records 48-72 hours following your procedure to check on you and address any questions or concerns that you may have regarding the information given to you following your procedure. If we do not reach you, we will leave a message.  We will attempt to reach you two times.  During this call, we will ask if you have developed any symptoms of COVID 19. If you develop any symptoms (ie: fever, flu-like symptoms, shortness of breath, cough etc.) before then, please call [phone].  If you test positive for Covid 19 in the 2 weeks post procedure, please call and report this information to [place].   ? ?If any biopsies were taken you will be contacted by phone or by letter within the next 1-3 weeks.  Please call us at [phone] if you have not heard about the biopsies in 3 weeks.  ? ? ?SIGNATURES/CONFIDENTIALITY: ?You and/or your care partner have signed paperwork which will be entered into your electronic medical record.  These signatures attest to the fact that that the information above on your After Visit Summary has been reviewed and is understood.  Full responsibility of the confidentiality of this discharge information lies with you and/or your care-partner.  ?

## 2021-07-06 ENCOUNTER — Telehealth: Payer: Self-pay | Admitting: *Deleted

## 2021-07-06 ENCOUNTER — Telehealth: Payer: Self-pay

## 2021-07-06 NOTE — Telephone Encounter (Signed)
?  Follow up Call- ? ?Call back number 07/04/2021  ?Post procedure Call Back phone  # [phone]  ?Permission to leave phone message Yes  ?Some recent data might be hidden  ?  ? ?Left message ?

## 2021-07-06 NOTE — Telephone Encounter (Signed)
No answer for post procedure call back. Left Voicemail. ?

## 2021-07-11 ENCOUNTER — Encounter: Payer: Self-pay | Admitting: Gastroenterology

## 2021-08-04 ENCOUNTER — Other Ambulatory Visit: Payer: Self-pay | Admitting: Family Medicine

## 2021-08-29 NOTE — Progress Notes (Deleted)
? ?Subjective:  ? ? Patient ID: Brittany Salinas, female    DOB: [birth year], 71 y.o.   MRN: [id number] ? ?No chief complaint on file. ? ? ?HPI ?Patient is in today for her annual physical exam. ? ?Past Medical History:  ?Diagnosis Date  ? Arthritis   ? OA  ? Cataracts, both eyes   ? Depression   ? Family history of adverse reaction to anesthesia   ? mother and sister - slow to awaken  ? Hyperlipidemia   ? Hypertension   ? Insomnia   ? Memory loss   ? Right foot pain 10/09/2017  ? Vision abnormalities   ? ? ?Past Surgical History:  ?Procedure Laterality Date  ? BREAST BIOPSY Right   ? benign  ? BREAST REDUCTION SURGERY  2007  ? CESAREAN SECTION    ? 4 c-sections  ? COLONOSCOPY  2008  ? COLONOSCOPY W/ POLYPECTOMY  2013  ? EYE SURGERY Bilateral   ? catarcts  ? ORIF ELBOW FRACTURE Left 02/12/2020  ? Procedure: Left elbow olecranon osteotomy, ulnar nerve release, and surgical reconstruction distal end of the humerus with repair;  Surgeon: Roseanne Kaufman, MD;  Location: [facility];  Service: Orthopedics;  Laterality: Left;  2.5 hrs  ? REDUCTION MAMMAPLASTY Bilateral   ? ROTATOR CUFF REPAIR Bilateral   ? right in 2010, left in 2017.  ? TONSILLECTOMY    ? ? ?Family History  ?Problem Relation Age of Onset  ? Colon polyps Mother   ? Heart defect Mother   ?     valvular heart disease  ? Heart attack Father   ? Hyperlipidemia Father   ? Hypertension Father   ? Colon polyps Sister   ? Marfan syndrome Sister   ? Hypertension Brother   ? Other Brother   ?     vertigo  ? Breast cancer Maternal Aunt   ? Cancer Maternal Grandmother   ?     stomach  ? Stroke Maternal Grandfather   ? Marfan syndrome Maternal Grandfather   ?     ?  ? Psoriasis Daughter   ? Mental illness Son   ?     depression alcohol in past  ? Depression Son   ? Arrhythmia Son   ? Alcohol abuse Son   ?     h/o drug abuse  ? Colon cancer Neg Hx   ? Esophageal cancer Neg Hx   ? Rectal cancer Neg Hx   ? Stomach cancer Neg Hx   ? ? ?Social History  ? ?Socioeconomic  History  ? Marital status: Married  ?  Spouse name: Not on file  ? Number of children: Not on file  ? Years of education: Not on file  ? Highest education level: Not on file  ?Occupational History  ? Not on file  ?Tobacco Use  ? Smoking status: Never  ? Smokeless tobacco: Never  ?Vaping Use  ? Vaping Use: Never used  ?Substance and Sexual Activity  ? Alcohol use: Yes  ?  Alcohol/week: 2.0 standard drinks  ?  Types: 2 Glasses of wine per week  ? Drug use: No  ? Sexual activity: Not on file  ?Other Topics Concern  ? Not on file  ?Social History Narrative  ? Works as Geophysicist/field seismologist, 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 Uses occasional light alcohol  ? ?Social Determinants of Health  ? ?Financial Resource Strain: Low Risk   ? Difficulty of Paying Living  Expenses: Not hard at all  ?Food Insecurity: No Food Insecurity  ? Worried About Charity fundraiser in the Last Year: Never true  ? Ran Out of Food in the Last Year: Never true  ?Transportation Needs: No Transportation Needs  ? Lack of Transportation (Medical): No  ? Lack of Transportation (Non-Medical): No  ?Physical Activity: Insufficiently Active  ? Days of Exercise per Week: 3 days  ? Minutes of Exercise per Session: 30 min  ?Stress: No Stress Concern Present  ? Feeling of Stress : Not at all  ?Social Connections: Socially Integrated  ? Frequency of Communication with Friends and Family: More than three times a week  ? Frequency of Social Gatherings with Friends and Family: More than three times a week  ? Attends Religious Services: More than 4 times per year  ? Active Member of Clubs or Organizations: Yes  ? Attends Archivist Meetings: More than 4 times per year  ? Marital Status: Married  ?Intimate Partner Violence: Not At Risk  ? Fear of Current or Ex-Partner: No  ? Emotionally Abused: No  ? Physically Abused: No  ? Sexually Abused: No  ? ? ?Outpatient Medications Prior to Visit  ?Medication Sig Dispense Refill  ?  Ascorbic Acid (VITAMIN C) 1000 MG tablet Take 1,000 mg by mouth daily.    ? aspirin EC 81 MG tablet Take 81 mg by mouth daily. Swallow whole.    ? buPROPion (WELLBUTRIN XL) 300 MG 24 hr tablet TAKE 1 TABLET BY MOUTH EVERY DAY 90 tablet 1  ? Cholecalciferol (VITAMIN D3) 125 MCG (5000 UT) TABS Take 5,000 Units by mouth daily.    ? Krill Oil 500 MG CAPS Take 500 mg by mouth daily.    ? magnesium oxide (MAG-OX) 400 MG tablet Take 400 mg by mouth at bedtime.    ? meclizine (ANTIVERT) 12.5 MG tablet Take 1 tablet (12.5 mg total) by mouth 3 (three) times daily as needed for dizziness. 30 tablet 0  ? Melatonin 10 MG TABS Take 10 mg by mouth at bedtime.    ? metoprolol succinate (TOPROL-XL) 50 MG 24 hr tablet Take 1 tablet (50 mg total) by mouth daily. Take with or immediately following a meal. 90 tablet 1  ? niacin 500 MG tablet Take 500 mg by mouth daily.    ? rosuvastatin (CRESTOR) 10 MG tablet TAKE 1 TABLET BY MOUTH EVERY DAY 90 tablet 1  ? sertraline (ZOLOFT) 50 MG tablet TAKE 1 TABLET BY MOUTH EVERY DAY 90 tablet 1  ? TURMERIC CURCUMIN PO Take 750 mg by mouth daily.    ? ?No facility-administered medications prior to visit.  ? ? ?Allergies  ?Allergen Reactions  ? Codeine Nausea And Vomiting  ? ? ?ROS ? ?   ?Objective:  ?  ?Physical Exam ? ?There were no vitals taken for this visit. ?Wt Readings from Last 3 Encounters:  ?07/04/21 168 lb (76.2 kg)  ?06/20/21 168 lb (76.2 kg)  ?05/15/21 163 lb (73.9 kg)  ? ? ?Diabetic Foot Exam - Simple   ?No data filed ?  ? ?Lab Results  ?Component Value Date  ? WBC 7.0 04/02/2021  ? HGB 13.0 04/02/2021  ? HCT 38.6 04/02/2021  ? PLT 235.0 04/02/2021  ? GLUCOSE 91 04/02/2021  ? CHOL 191 04/02/2021  ? TRIG 149.0 04/02/2021  ? HDL 43.80 04/02/2021  ? LDLDIRECT 86.0 04/13/2018  ? [place] 118 (H) 04/02/2021  ? ALT 14 04/02/2021  ? AST 17 04/02/2021  ?  NA 137 04/02/2021  ? K 4.2 04/02/2021  ? CL 103 04/02/2021  ? CREATININE 0.80 04/02/2021  ? BUN 11 04/02/2021  ? CO2 23 04/02/2021  ? TSH 2.35  04/02/2021  ? ? ?Lab Results  ?Component Value Date  ? TSH 2.35 04/02/2021  ? ?Lab Results  ?Component Value Date  ? WBC 7.0 04/02/2021  ? HGB 13.0 04/02/2021  ? HCT 38.6 04/02/2021  ? MCV 90.2 04/02/2021  ? PLT 235.0 04/02/2021  ? ?Lab Results  ?Component Value Date  ? NA 137 04/02/2021  ? K 4.2 04/02/2021  ? CO2 23 04/02/2021  ? GLUCOSE 91 04/02/2021  ? BUN 11 04/02/2021  ? CREATININE 0.80 04/02/2021  ? BILITOT 0.9 04/02/2021  ? ALKPHOS 58 04/02/2021  ? AST 17 04/02/2021  ? ALT 14 04/02/2021  ? PROT 6.9 04/02/2021  ? ALBUMIN 4.5 04/02/2021  ? CALCIUM 9.5 04/02/2021  ? ANIONGAP 11 02/12/2020  ? GFR 75.09 04/02/2021  ? ?Lab Results  ?Component Value Date  ? CHOL 191 04/02/2021  ? ?Lab Results  ?Component Value Date  ? HDL 43.80 04/02/2021  ? ?Lab Results  ?Component Value Date  ? [place] 118 (H) 04/02/2021  ? ?Lab Results  ?Component Value Date  ? TRIG 149.0 04/02/2021  ? ?Lab Results  ?Component Value Date  ? CHOLHDL 4 04/02/2021  ? ?No results found for: HGBA1C ? ?   ?Assessment & Plan:  ? ?Problem List Items Addressed This Visit   ?None ? ? ?I am having Brittany Salinas maintain her Melatonin, meclizine, aspirin EC, Krill Oil, niacin, vitamin C, Vitamin D3, TURMERIC CURCUMIN PO, magnesium oxide, rosuvastatin, buPROPion, metoprolol succinate, and sertraline. ? ?No orders of the defined types were placed in this encounter. ? ? ? ? ?

## 2021-08-30 ENCOUNTER — Encounter: Payer: Self-pay | Admitting: Family Medicine

## 2021-08-30 ENCOUNTER — Ambulatory Visit (INDEPENDENT_AMBULATORY_CARE_PROVIDER_SITE_OTHER): Payer: PPO | Admitting: Family Medicine

## 2021-08-30 VITALS — BP 110/70 | HR 62 | Resp 20 | Ht 64.0 in | Wt 167.4 lb

## 2021-08-30 DIAGNOSIS — E785 Hyperlipidemia, unspecified: Secondary | ICD-10-CM

## 2021-08-30 DIAGNOSIS — Z0000 Encounter for general adult medical examination without abnormal findings: Secondary | ICD-10-CM | POA: Diagnosis not present

## 2021-08-30 DIAGNOSIS — R413 Other amnesia: Secondary | ICD-10-CM

## 2021-08-30 DIAGNOSIS — Z1159 Encounter for screening for other viral diseases: Secondary | ICD-10-CM

## 2021-08-30 DIAGNOSIS — R269 Unspecified abnormalities of gait and mobility: Secondary | ICD-10-CM

## 2021-08-30 DIAGNOSIS — I10 Essential (primary) hypertension: Secondary | ICD-10-CM

## 2021-08-30 DIAGNOSIS — Z1231 Encounter for screening mammogram for malignant neoplasm of breast: Secondary | ICD-10-CM

## 2021-08-30 DIAGNOSIS — R2681 Unsteadiness on feet: Secondary | ICD-10-CM | POA: Diagnosis not present

## 2021-08-30 DIAGNOSIS — Z9189 Other specified personal risk factors, not elsewhere classified: Secondary | ICD-10-CM

## 2021-08-30 DIAGNOSIS — G3184 Mild cognitive impairment, so stated: Secondary | ICD-10-CM | POA: Diagnosis not present

## 2021-08-30 DIAGNOSIS — W19XXXA Unspecified fall, initial encounter: Secondary | ICD-10-CM

## 2021-08-30 NOTE — Patient Instructions (Addendum)
6-8 hours of sleep ?60-80 ounces water ?7-10000 steps daily ?Protein every 4-5 hours ?Multivitamin with minerals ?Fish oil ? ?Shingrix is the new shingles shot, 2 shots over 2-6 months, confirm coverage with insurance and document, then can return here for shots with nurse appt or at pharmacy  ? ?Prevnar 20  ? ?Tetanus if injured  ? ? ?Preventive Care 70 Years and Older, Female ?Preventive care refers to lifestyle choices and visits with your health care provider that can promote health and wellness. Preventive care visits are also called wellness exams. ?What can I expect for my preventive care visit? ?Counseling ?Your health care provider may ask you questions about your: ?Medical history, including: ?Past medical problems. ?Family medical history. ?Pregnancy and menstrual history. ?History of falls. ?Current health, including: ?Memory and ability to understand (cognition). ?Emotional well-being. ?Home life and relationship well-being. ?Sexual activity and sexual health. ?Lifestyle, including: ?Alcohol, nicotine or tobacco, and drug use. ?Access to firearms. ?Diet, exercise, and sleep habits. ?Work and work Statistician. ?Sunscreen use. ?Safety issues such as seatbelt and bike helmet use. ?Physical exam ?Your health care provider will check your: ?Height and weight. These may be used to calculate your BMI (body mass index). BMI is a measurement that tells if you are at a healthy weight. ?Waist circumference. This measures the distance around your waistline. This measurement also tells if you are at a healthy weight and may help predict your risk of certain diseases, such as type 2 diabetes and high blood pressure. ?Heart rate and blood pressure. ?Body temperature. ?Skin for abnormal spots. ?What immunizations do I need? ? ?Vaccines are usually given at various ages, according to a schedule. Your health care provider will recommend vaccines for you based on your age, medical history, and lifestyle or other factors,  such as travel or where you work. ?What tests do I need? ?Screening ?Your health care provider may recommend screening tests for certain conditions. This may include: ?Lipid and cholesterol levels. ?Hepatitis C test. ?Hepatitis B test. ?HIV (human immunodeficiency virus) test. ?STI (sexually transmitted infection) testing, if you are at risk. ?Lung cancer screening. ?Colorectal cancer screening. ?Diabetes screening. This is done by checking your blood sugar (glucose) after you have not eaten for a while (fasting). ?Mammogram. Talk with your health care provider about how often you should have regular mammograms. ?BRCA-related cancer screening. This may be done if you have a family history of breast, ovarian, tubal, or peritoneal cancers. ?Bone density scan. This is done to screen for osteoporosis. ?Talk with your health care provider about your test results, treatment options, and if necessary, the need for more tests. ?Follow these instructions at home: ?Eating and drinking ? ?Eat a diet that includes fresh fruits and vegetables, whole grains, lean protein, and low-fat dairy products. Limit your intake of foods with high amounts of sugar, saturated fats, and salt. ?Take vitamin and mineral supplements as recommended by your health care provider. ?Do not drink alcohol if your health care provider tells you not to drink. ?If you drink alcohol: ?Limit how much you have to 0-1 drink a day. ?Know how much alcohol is in your drink. In the [place] one drink equals one 12 oz bottle of beer (355 mL), one 5 oz glass of wine (148 mL), or one 1? oz glass of hard liquor (44 mL). ?Lifestyle ?Brush your teeth every morning and night with fluoride toothpaste. Floss one time each day. ?Exercise for at least 30 minutes 5 or more days each week. ?Do not  use any products that contain nicotine or tobacco. These products include cigarettes, chewing tobacco, and vaping devices, such as e-cigarettes. If you need help quitting, ask your  health care provider. ?Do not use drugs. ?If you are sexually active, practice safe sex. Use a condom or other form of protection in order to prevent STIs. ?Take aspirin only as told by your health care provider. Make sure that you understand how much to take and what form to take. Work with your health care provider to find out whether it is safe and beneficial for you to take aspirin daily. ?Ask your health care provider if you need to take a cholesterol-lowering medicine (statin). ?Find healthy ways to manage stress, such as: ?Meditation, yoga, or listening to music. ?Journaling. ?Talking to a trusted person. ?Spending time with friends and family. ?Minimize exposure to UV radiation to reduce your risk of skin cancer. ?Safety ?Always wear your seat belt while driving or riding in a vehicle. ?Do not drive: ?If you have been drinking alcohol. Do not ride with someone who has been drinking. ?When you are tired or distracted. ?While texting. ?If you have been using any mind-altering substances or drugs. ?Wear a helmet and other protective equipment during sports activities. ?If you have firearms in your house, make sure you follow all gun safety procedures. ?What's next? ?Visit your health care provider once a year for an annual wellness visit. ?Ask your health care provider how often you should have your eyes and teeth checked. ?Stay up to date on all vaccines. ?This information is not intended to replace advice given to you by your health care provider. Make sure you discuss any questions you have with your health care provider. ?Document Revised: 10/18/2020 Document Reviewed: 10/18/2020 ?Elsevier Patient Education ? [place]. ? ?

## 2021-08-30 NOTE — Progress Notes (Signed)
? ?Subjective:  ? ?By signing my name below, I, Zite Okoli, attest that this documentation has been prepared under the direction and in the presence of Mosie Lukes, MD. 08/30/2021  ? ? Patient ID: Brittany Salinas, female    DOB: 03/07/1951, 71 y.o.   MRN: [id number] ? ?Chief Complaint  ?Patient presents with  ? Annual Exam  ? ? ?HPI ?Patient is in today for a comprehensive physical exam. ? ?She reports she is having memory issues. Started about couple years ago but she didn't pay attention to it. It has increased  over the past year and a half and it is mostly short-term memory. There is no family history of memory loss/dementia. She does not feel like she is depressed at this time. She takes 50 mg Zoloft and 300 mg Wellbutrin and it is helping to stabilize her mood. ? ?She is complaining of not picking her feet up while walking and while walking, tends to veer off from walking in a straight line. Adds that while leaning forward or backward, she tends to keep leaning. She has been extra careful to avoid falls but had one fall a month ago on an unsteady floor. She had physical therapy for loss of balance before and it was very helpful. ? ?She had Covid-19 back in November 2022 and it affected her sense of smell and taste. The right side of her mouth does not taste as much as the left side. She has also had a persistent tickle cough since then. ? ?She is not interested in pneumonia vaccine. She will schedule the tetanus and shingles vaccines for later. ? ?She denies fever, congestion, eye pain, chest pain, palpitations, leg swelling, shortness of breath, nausea, abdominal pain, diarrhea and blood in stool. Also denies dysuria, frequency, back pain and headaches.  ? ?Her sister recently started showing symptoms of a connective tissue disorder. ? ?Past Medical History:  ?Diagnosis Date  ? Arthritis   ? OA  ? Blood transfusion without reported diagnosis 1997  ? Complications c-section  ? Cataracts, both eyes    ? Depression   ? Family history of adverse reaction to anesthesia   ? mother and sister - slow to awaken  ? Hyperlipidemia   ? Hypertension   ? Insomnia   ? Memory loss   ? Right foot pain 10/09/2017  ? Vision abnormalities   ? ? ?Past Surgical History:  ?Procedure Laterality Date  ? BREAST BIOPSY Right   ? benign  ? BREAST REDUCTION SURGERY  2007  ? CESAREAN SECTION    ? 4 c-sections  ? COLONOSCOPY  2008  ? COLONOSCOPY W/ POLYPECTOMY  2013  ? EYE SURGERY Bilateral   ? catarcts  ? FRACTURE SURGERY  Oct 2021  ? Shattered elbow  ? ORIF ELBOW FRACTURE Left 02/12/2020  ? Procedure: Left elbow olecranon osteotomy, ulnar nerve release, and surgical reconstruction distal end of the humerus with repair;  Surgeon: Roseanne Kaufman, MD;  Location: [facility];  Service: Orthopedics;  Laterality: Left;  2.5 hrs  ? REDUCTION MAMMAPLASTY Bilateral   ? ROTATOR CUFF REPAIR Bilateral   ? right in 2010, left in 2017.  ? TONSILLECTOMY    ? TUBAL LIGATION    ? ? ?Family History  ?Problem Relation Age of Onset  ? Colon polyps Mother   ? Heart defect Mother   ?     valvular heart disease  ? Depression Mother   ? Heart attack Father   ? Hyperlipidemia Father   ?  Hypertension Father   ? Colon polyps Sister   ? Marfan syndrome Sister   ? Other Sister   ?     connective tissure disorder  ? Hypertension Brother   ? Other Brother   ?     vertigo  ? Psoriasis Daughter   ? Mental illness Son   ?     depression alcohol in past  ? Depression Son   ? Arrhythmia Son   ? Alcohol abuse Son   ?     h/o drug abuse  ? Breast cancer Maternal Aunt   ? Cancer Maternal Grandmother   ?     stomach  ? Asthma Maternal Grandfather   ? Stroke Maternal Grandfather   ? Marfan syndrome Maternal Grandfather   ?     ?  ? Alcohol abuse Maternal Grandfather   ? Colon cancer Neg Hx   ? Esophageal cancer Neg Hx   ? Rectal cancer Neg Hx   ? Stomach cancer Neg Hx   ? ? ?Social History  ? ?Socioeconomic History  ? Marital status: Married  ?  Spouse name: Not on file  ? Number of  children: Not on file  ? Years of education: Not on file  ? Highest education level: Not on file  ?Occupational History  ? Not on file  ?Tobacco Use  ? Smoking status: Never  ? Smokeless tobacco: Never  ?Vaping Use  ? Vaping Use: Never used  ?Substance and Sexual Activity  ? Alcohol use: Yes  ?  Alcohol/week: 2.0 standard drinks  ?  Types: 2 Glasses of wine per week  ? Drug use: No  ? Sexual activity: Not on file  ?Other Topics Concern  ? Not on file  ?Social History Narrative  ? Works as Geophysicist/field seismologist, 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 Uses occasional light alcohol  ? ?Social Determinants of Health  ? ?Financial Resource Strain: Low Risk   ? Difficulty of Paying Living Expenses: Not hard at all  ?Food Insecurity: No Food Insecurity  ? Worried About Charity fundraiser in the Last Year: Never true  ? Ran Out of Food in the Last Year: Never true  ?Transportation Needs: No Transportation Needs  ? Lack of Transportation (Medical): No  ? Lack of Transportation (Non-Medical): No  ?Physical Activity: Insufficiently Active  ? Days of Exercise per Week: 3 days  ? Minutes of Exercise per Session: 30 min  ?Stress: No Stress Concern Present  ? Feeling of Stress : Not at all  ?Social Connections: Socially Integrated  ? Frequency of Communication with Friends and Family: More than three times a week  ? Frequency of Social Gatherings with Friends and Family: More than three times a week  ? Attends Religious Services: More than 4 times per year  ? Active Member of Clubs or Organizations: Yes  ? Attends Archivist Meetings: More than 4 times per year  ? Marital Status: Married  ?Intimate Partner Violence: Not At Risk  ? Fear of Current or Ex-Partner: No  ? Emotionally Abused: No  ? Physically Abused: No  ? Sexually Abused: No  ? ? ?Outpatient Medications Prior to Visit  ?Medication Sig Dispense Refill  ? Ascorbic Acid (VITAMIN C) 1000 MG tablet Take 1,000 mg by mouth daily.    ? aspirin  EC 81 MG tablet Take 81 mg by mouth daily. Swallow whole.    ? buPROPion (WELLBUTRIN XL) 300 MG 24 hr tablet  TAKE 1 TABLET BY MOUTH EVERY DAY 90 tablet 1  ? Cholecalciferol (VITAMIN D3) 125 MCG (5000 UT) TABS Take 5,000 Units by mouth daily.    ? Krill Oil 500 MG CAPS Take 500 mg by mouth daily.    ? magnesium oxide (MAG-OX) 400 MG tablet Take 400 mg by mouth at bedtime.    ? meclizine (ANTIVERT) 12.5 MG tablet Take 1 tablet (12.5 mg total) by mouth 3 (three) times daily as needed for dizziness. 30 tablet 0  ? Melatonin 10 MG TABS Take 10 mg by mouth at bedtime.    ? metoprolol succinate (TOPROL-XL) 50 MG 24 hr tablet Take 1 tablet (50 mg total) by mouth daily. Take with or immediately following a meal. 90 tablet 1  ? niacin 500 MG tablet Take 500 mg by mouth daily.    ? rosuvastatin (CRESTOR) 10 MG tablet TAKE 1 TABLET BY MOUTH EVERY DAY 90 tablet 1  ? sertraline (ZOLOFT) 50 MG tablet TAKE 1 TABLET BY MOUTH EVERY DAY 90 tablet 1  ? TURMERIC CURCUMIN PO Take 750 mg by mouth daily.    ? ?No facility-administered medications prior to visit.  ? ? ?Allergies  ?Allergen Reactions  ? Codeine Nausea And Vomiting  ? ? ?Review of Systems  ?Constitutional:  Negative for fever and malaise/fatigue.  ?HENT:  Negative for congestion and sore throat.   ?Eyes:  Negative for pain.  ?Respiratory:  Negative for cough and shortness of breath.   ?Cardiovascular:  Negative for chest pain, palpitations and leg swelling.  ?Gastrointestinal:  Negative for abdominal pain, blood in stool, diarrhea and nausea.  ?Genitourinary:  Negative for dysuria and frequency.  ?Musculoskeletal:  Positive for falls. Negative for back pain and myalgias.  ?Skin:  Negative for rash.  ?Neurological:  Negative for headaches.  ?     (+) unsteady gait  ?Psychiatric/Behavioral:  Positive for memory loss. Negative for depression.   ? ?   ?Objective:  ?  ?Physical Exam ?Constitutional:   ?   General: She is not in acute distress. ?   Appearance: She is  well-developed.  ?HENT:  ?   Head: Normocephalic and atraumatic.  ?   Right Ear: Tympanic membrane, ear canal and external ear normal.  ?   Left Ear: Tympanic membrane, ear canal and external ear normal.  ?Eyes:

## 2021-08-31 LAB — CBC
HCT: 39.6 % (ref 36.0–46.0)
Hemoglobin: 13.2 g/dL (ref 12.0–15.0)
MCHC: 33.3 g/dL (ref 30.0–36.0)
MCV: 91.4 fl (ref 78.0–100.0)
Platelets: 256 10*3/uL (ref 150.0–400.0)
RBC: 4.34 Mil/uL (ref 3.87–5.11)
RDW: 12.6 % (ref 11.5–15.5)
WBC: 8 10*3/uL (ref 4.0–10.5)

## 2021-08-31 LAB — LIPID PANEL
Cholesterol: 185 mg/dL (ref 0–200)
HDL: 65.3 mg/dL (ref >39.00)
NonHDL: 119.9
Total CHOL/HDL Ratio: 3
Triglycerides: 238 mg/dL — ABNORMAL HIGH (ref 0.0–149.0)
VLDL: 47.6 mg/dL — ABNORMAL HIGH (ref 0.0–40.0)

## 2021-08-31 LAB — COMPREHENSIVE METABOLIC PANEL
ALT: 19 U/L (ref 0–35)
AST: 23 U/L (ref 0–37)
Albumin: 4.5 g/dL (ref 3.5–5.2)
Alkaline Phosphatase: 63 U/L (ref 39–117)
BUN: 13 mg/dL (ref 6–23)
CO2: 27 mEq/L (ref 19–32)
Calcium: 9.6 mg/dL (ref 8.4–10.5)
Chloride: 105 mEq/L (ref 96–112)
Creatinine, Ser: 0.77 mg/dL (ref 0.40–1.20)
GFR: 78.38 mL/min (ref >60.00)
Glucose, Bld: 80 mg/dL (ref 70–99)
Potassium: 4.1 mEq/L (ref 3.5–5.1)
Sodium: 141 mEq/L (ref 135–145)
Total Bilirubin: 0.5 mg/dL (ref 0.2–1.2)
Total Protein: 7 g/dL (ref 6.0–8.3)

## 2021-08-31 LAB — LDL CHOLESTEROL, DIRECT: Direct LDL: 99 mg/dL

## 2021-08-31 LAB — HEPATITIS C ANTIBODY
Hepatitis C Ab: NONREACTIVE
SIGNAL TO CUT-OFF: 0.03 (ref <1.00)

## 2021-08-31 LAB — TSH: TSH: 3.23 u[IU]/mL (ref 0.35–5.50)

## 2021-09-02 NOTE — Assessment & Plan Note (Signed)
Is noting more trouble feeling weak and picking up her feet when walking. She will follow up with neurology and she should consider PT ?

## 2021-09-02 NOTE — Assessment & Plan Note (Signed)
Patient encouraged to maintain heart healthy diet, regular exercise, adequate sleep. Consider daily probiotics. Take medications as prescribed. Labs ordered and reviewed. Last MGM 04/2020, repeat ordered today, last colonoscopy 04/2018 repeat in next year. Dexa March 2022, repeat in 2024 or 2025. ?

## 2021-09-02 NOTE — Assessment & Plan Note (Signed)
Encourage heart healthy diet such as MIND or DASH diet, increase exercise, avoid trans fats, simple carbohydrates and processed foods, consider a krill or fish or flaxseed oil cap daily.  °

## 2021-09-02 NOTE — Assessment & Plan Note (Signed)
Well controlled, no changes to meds. Encouraged heart healthy diet such as the DASH diet and exercise as tolerated.  °

## 2021-09-02 NOTE — Assessment & Plan Note (Signed)
Is seeing neurology but she notes her memory continues to worsen and she finds herself very anxious about this. Referred for neuropsych testing so she can be better informed ?

## 2021-09-04 ENCOUNTER — Ambulatory Visit (HOSPITAL_BASED_OUTPATIENT_CLINIC_OR_DEPARTMENT_OTHER)
Admission: RE | Admit: 2021-09-04 | Discharge: 2021-09-04 | Disposition: A | Discharge status: Home / Self Care | Payer: PPO | Source: Ambulatory Visit | Attending: Family Medicine | Admitting: Family Medicine

## 2021-09-04 ENCOUNTER — Encounter (HOSPITAL_BASED_OUTPATIENT_CLINIC_OR_DEPARTMENT_OTHER): Payer: Self-pay

## 2021-09-04 DIAGNOSIS — Z1231 Encounter for screening mammogram for malignant neoplasm of breast: Secondary | ICD-10-CM | POA: Diagnosis not present

## 2021-09-12 ENCOUNTER — Ambulatory Visit: Payer: PPO | Admitting: Physician Assistant

## 2021-09-12 ENCOUNTER — Encounter: Payer: Self-pay | Admitting: Physician Assistant

## 2021-09-12 ENCOUNTER — Other Ambulatory Visit (INDEPENDENT_AMBULATORY_CARE_PROVIDER_SITE_OTHER): Payer: PPO

## 2021-09-12 VITALS — BP 156/84 | HR 63 | Resp 18 | Ht 64.0 in | Wt 165.0 lb

## 2021-09-12 DIAGNOSIS — G3184 Mild cognitive impairment, so stated: Secondary | ICD-10-CM | POA: Diagnosis not present

## 2021-09-12 DIAGNOSIS — R269 Unspecified abnormalities of gait and mobility: Secondary | ICD-10-CM | POA: Diagnosis not present

## 2021-09-12 LAB — VITAMIN B12: Vitamin B-12: 152 pg/mL — ABNORMAL LOW (ref 211–911)

## 2021-09-12 NOTE — Progress Notes (Signed)
Please let patient know her B12 is low, needs to start B12 1000 mcg daily and follow with PCP, thanks

## 2021-09-12 NOTE — Progress Notes (Signed)
? ? ?Assessment/Plan:  ? ?Brittany Salinas is a very pleasant 71 y.o. year old RH female  seen in neurologic consultation at the request of Mosie Lukes, MD for the evaluation of memory. She has a history of hypertension, hyperlipidemia, depression, osteoarthritis, history of colon polyps s/p resection of these polyps March 2023. Patient has a history of mild cognitive impairment dating back to 2017 at which time an MRI of the brain (reviewed by me), revealed mild perisylvian and temporal atrophy, mild periventricular and subcortical chronic small vessel ischemic disease.  At the time, ACHI was recommended, but the patient declined. Her memory concerns are still present.  MoCA today is 26/30 with delayed recall 4/5.  Visuospatial 3/5. Workup is ongoing. ?  ?MIld Cognitive Impairment ? ?MRI of the brain with and without contrast to assess for underlying structural abnormality and assess vascular load ?Neurocognitive testing to further evaluate cognitive concerns and determine other underlying cause of memory changes, including potential contribution from sleep, anxiety and depression ?Check B12 and TSH ?Referral to physical therapy-Occupational Therapy for gait instability, balance and strength. ?Follow-up in 1 month ? ?Gait instability - disturbance ? ? This is dating back to around 2015, but since then, she has sustained several falls, last one 1 month ago.  She reports that she has difficulty picking up her feet, and feels that she is leaning to the sides.  She denies any dizziness,numbness or other strokelike symptoms. She has chronic feet issues that contribute to her gait instability including bunions and other arthritic deformities. She denies any history of CVA or TIA.  ? ? Recommendations: ? ?Referral to physical therapy-Occupational Therapy for balance and strength ?Check MRI of the brain to assess for underlying structural abnormalities, and assess vascular load. ? ?Subjective:  ? ? ?The  patient is  The patient is here alone.  ? ?How long did patient have memory difficulties?  Since 2015.  She had previously been evaluated in December 2017 at [facility] when she was noting difficulty remembering recent events, had trouble planning and executing a plan.  Patient was working as a Geophysicist/field seismologist, and could not remember the names of the muscles or insertions and she needed a cue.  She is a good reader, and could not recall the titles of the authors.  MoCA at that time was 26/30, with delayed recall 2/5.  Aricept was recommended, but the patient preferred to hold off.   As her MoCA improved to 29/30 as of January 2019, it was felt that her memory issues were likely worsen by depression and insomnia then to a degenerative process such as Alzheimer's disease.  She tried to stay active, eat clean, get regular exercise, and sleep regularly, but her cognitive issues mentioned above remained present.  During a well exam on 08/30/2021, the patient reported worsening short-term memory, and does not suspect that this is due to depression "because it is under control'. ?      Today she reports memory is worse, trying to keep up with dates on the calendar and appointments, trying to not to plan too much ahead.  "I missed several   ?      birthdays ". She also notices that for the last year her speech is not as fluent as before , "I could put a half a sentence and have to figure the rest of the  ?      sentence.  " I am thinking of a word and another comes out, so I  stopped participating on discussions". However, if she reads aloud "it is so much better than  ?       reading in silence". She also does sodoku and crossword puzzles. ?Patient lives with: Spouse who notices, he is more aware of these changes.  ?repeats oneself?  Endorsed, not very frequently except when trying to remember appointments she mar ask several times, for example "what is happening at [facility] this Sunday?" ?Disoriented when walking into a room?    Denies  ?Leaving objects in unusual places?   Denies  ?Ambulates  with difficulty?   Patient has difficulty with gait dating back to 2015, when she reported feeling "a little off balance ".  However, there are days that she has difficulty picking her feet up while walking, unable to walk a straight line, and has to be extra careful to avoid falls.  "  I have bunions, bones moved in both feet and became painful and have caused some trouble walking"  ?Recent falls?  Endorsed.  She had a recent fall 1 month ago on an unsteady floor. She has had physical therapy for loss of balance before (2018), which was helpful  ?Any head injuries?   Denies  ?History of seizures?     Denies  ?Wandering behavior?    Denies  ?Patient drives?  Endorsed, prefers to drive short distances, but with GPS" is not nerve wracking as before " ?Any mood changes such irritability agitation?   Denies    ?Any history of depression?:  Endorsed.  The patient takes Zoloft and Wellbutrin, well controlled. ?Hallucinations?  Denies  ?Paranoia?   Denies  ?Patient sleeps between 6 and 8 hours, denies vivid dreams or REM behavior.  I wake up by hearing something and he says I didn't hear anything, I heard a squirrel on the room, and the voice of her children for the last year. She is not sure if she is dreaming or if it  hallucination.  She uses 5 mg melatonin and magnesium to help her sleep. ?History of sleep apnea?  Denies ?Any hygiene concerns?    Denies  ?Independent of bathing and dressing?  Endorsed  ?Does the patient needs help with medications?  Denies. Uses a pillbox   ?Who is in charge of the finances? More and more I don't feel confident about doing the finances.    ?Any changes in appetite?  Endorsed, but started with Covid around Nov 2022, with decreased sense of smell and taste "so food doesn't taste good".  Drinks up to 64 oz a day water, tea and iced tea   ?Patient have trouble swallowing? Denied  ?Does the patient cook?   Nos as much  ?Any  kitchen accidents such as leaving the stove on?  Denies  ?Any headaches?   Denies ?The double vision? Denies  ?Any focal numbness or tingling?  Denies  ?Chronic back pain  Denies  ?Chronic neck pain :and sees a chiropractor  ?Unilateral weakness?  Denies. Any tremors?    ?Any history of anosmia?  Endorsed as above ?Any incontinence of urine?   Endorsed, stress incontinence, uses a pad . This is worse over the last 3 years  ?Any bowel dysfunction?    Occasionally has constipation ?History of heavy alcohol intake?    ?History of heavy tobacco use?    ?Family history of dementia?   Mother with vascular Dementia    ? ?Graduate school [id number]) education  ? ?MRI of the brain 04/22/16 (reviewed by me) revealed  mild perisylvian and anterior temporal atrophy, mild periventricular and subcortical chronic small vessel ischemic disease, otherwise no acute findings. ? ?Past Medical History:  ?Diagnosis Date  ? Arthritis   ? OA  ? Blood transfusion without reported diagnosis 1997  ? Complications c-section  ? Cataracts, both eyes   ? Depression   ? Family history of adverse reaction to anesthesia   ? mother and sister - slow to awaken  ? Hyperlipidemia   ? Hypertension   ? Insomnia   ? Memory loss   ? Right foot pain 10/09/2017  ? Vision abnormalities   ?  ? ?Past Surgical History:  ?Procedure Laterality Date  ? BREAST BIOPSY Right   ? benign  ? BREAST REDUCTION SURGERY  2007  ? CESAREAN SECTION    ? 4 c-sections  ? COLONOSCOPY  2008  ? COLONOSCOPY W/ POLYPECTOMY  2013  ? EYE SURGERY Bilateral   ? catarcts  ? FRACTURE SURGERY  Oct 2021  ? Shattered elbow  ? ORIF ELBOW FRACTURE Left 02/12/2020  ? Procedure: Left elbow olecranon osteotomy, ulnar nerve release, and surgical reconstruction distal end of the humerus with repair;  Surgeon: Roseanne Kaufman, MD;  Location: [facility];  Service: Orthopedics;  Laterality: Left;  2.5 hrs  ? REDUCTION MAMMAPLASTY Bilateral   ? ROTATOR CUFF REPAIR Bilateral   ? right in 2010, left in 2017.  ?  TONSILLECTOMY    ? TUBAL LIGATION    ?  ? ?Allergies  ?Allergen Reactions  ? Codeine Nausea And Vomiting  ? ? ?Current Outpatient Medications  ?Medication Instructions  ? aspirin EC 81 mg, Oral, Daily, Swallow whol

## 2021-09-12 NOTE — Patient Instructions (Addendum)
It was a pleasure to see you today at our office.  ? ?Recommendations: ? ?Neurocognitive evaluation at our office ?MRI of the brain, the radiology office will call you to arrange you appointment ?Check labs today ?Follow up in 1 month ? ?RECOMMENDATIONS FOR ALL PATIENTS WITH MEMORY PROBLEMS: ?1. Continue to exercise (Recommend 30 minutes of walking everyday, or 3 hours every week) ?2. Increase social interactions - continue going to [place] and enjoy social gatherings with friends and family ?3. Eat healthy, avoid fried foods and eat more fruits and vegetables ?4. Maintain adequate blood pressure, blood sugar, and blood cholesterol level. Reducing the risk of stroke and cardiovascular disease also helps promoting better memory. ?5. Avoid stressful situations. Live a simple life and avoid aggravations. Organize your time and prepare for the next day in anticipation. ?6. Sleep well, avoid any interruptions of sleep and avoid any distractions in the bedroom that may interfere with adequate sleep quality ?7. Avoid sugar, avoid sweets as there is a strong link between excessive sugar intake, diabetes, and cognitive impairment ?We discussed the Mediterranean diet, which has been shown to help patients reduce the risk of progressive memory disorders and reduces cardiovascular risk. This includes eating fish, eat fruits and green leafy vegetables, nuts like almonds and hazelnuts, walnuts, and also use olive oil. Avoid fast foods and fried foods as much as possible. Avoid sweets and sugar as sugar use has been linked to worsening of memory function. ? ?There is always a concern of gradual progression of memory problems. If this is the case, then we may need to adjust level of care according to patient needs. Support, both to the patient and caregiver, should then be put into place.  ? ? ? ? ?You have been referred for a neuropsychological evaluation (i.e., evaluation of memory and thinking abilities). Please bring someone  with you to this appointment if possible, as it is helpful for the doctor to hear from both you and another adult who knows you well. Please bring eyeglasses and hearing aids if you wear them.  ?  ?The evaluation will take approximately 3 hours and has two parts: ?  ?The first part is a clinical interview with the neuropsychologist (Dr. Melvyn Novas or Dr. Nicole Kindred). During the interview, the neuropsychologist will speak with you and the individual you brought to the appointment.  ?  ?The second part of the evaluation is testing with the doctor's technician Hinton Dyer or Maudie Mercury). During the testing, the technician will ask you to remember different types of material, solve problems, and answer some questionnaires. Your family member will not be present for this portion of the evaluation. ?  ?Please note: We must reserve several hours of the neuropsychologist's time and the psychometrician's time for your evaluation appointment. As such, there is a No-Show fee of $100. If you are unable to attend any of your appointments, please contact our office as soon as possible to reschedule.  ? ? ?FALL PRECAUTIONS: Be cautious when walking. Scan the area for obstacles that may increase the risk of trips and falls. When getting up in the mornings, sit up at the edge of the bed for a few minutes before getting out of bed. Consider elevating the bed at the head end to avoid drop of blood pressure when getting up. Walk always in a well-lit room (use night lights in the walls). Avoid area rugs or power cords from appliances in the middle of the walkways. Use a walker or a cane if necessary and  consider physical therapy for balance exercise. Get your eyesight checked regularly. ? ?FINANCIAL OVERSIGHT: Supervision, especially oversight when making financial decisions or transactions is also recommended. ? ?HOME SAFETY: Consider the safety of the kitchen when operating appliances like stoves, microwave oven, and blender. Consider having supervision  and share cooking responsibilities until no longer able to participate in those. Accidents with firearms and other hazards in the house should be identified and addressed as well. ? ? ?ABILITY TO BE LEFT ALONE: If patient is unable to contact 911 operator, consider using LifeLine, or when the need is there, arrange for someone to stay with patients. Smoking is a fire hazard, consider supervision or cessation. Risk of wandering should be assessed by caregiver and if detected at any point, supervision and safe proof recommendations should be instituted. ? ?MEDICATION SUPERVISION: Inability to self-administer medication needs to be constantly addressed. Implement a mechanism to ensure safe administration of the medications. ? ? ?DRIVING: Regarding driving, in patients with progressive memory problems, driving will be impaired. We advise to have someone else do the driving if trouble finding directions or if minor accidents are reported. Independent driving assessment is available to determine safety of driving. ? ? ?If you are interested in the driving assessment, you can contact the following: ? ?[facility] [employer] [facility] in [place] ? ?[place] [phone] ? ?[facility] [phone] ? ?[facility] [phone] or [phone] ? ? ? ?Mediterranean Diet ?A Mediterranean diet refers to food and lifestyle choices that are based on the traditions of countries located on the [employer]. This way of eating has been shown to help prevent certain conditions and improve outcomes for people who have chronic diseases, like kidney disease and heart disease. ?What are tips for following this plan? ?Lifestyle  ?Cook and eat meals together with your family, when possible. ?Drink enough fluid to keep your urine clear or pale yellow. ?Be physically active every day. This includes: ?Aerobic exercise like running or swimming. ?Leisure activities like gardening, walking, or  housework. ?Get 7-8 hours of sleep each night. ?If recommended by your health care provider, drink red wine in moderation. This means 1 glass a day for nonpregnant women and 2 glasses a day for men. A glass of wine equals 5 oz (150 mL). ?Reading food labels  ?Check the serving size of packaged foods. For foods such as rice and pasta, the serving size refers to the amount of cooked product, not dry. ?Check the total fat in packaged foods. Avoid foods that have saturated fat or trans fats. ?Check the ingredients list for added sugars, such as corn syrup. ?Shopping  ?At the grocery store, buy most of your food from the areas near the walls of the store. This includes: ?Fresh fruits and vegetables (produce). ?Grains, beans, nuts, and seeds. Some of these may be available in unpackaged forms or large amounts (in bulk). ?Fresh seafood. ?Poultry and eggs. ?Low-fat dairy products. ?Buy whole ingredients instead of prepackaged foods. ?Buy fresh fruits and vegetables in-season from local farmers markets. ?Buy frozen fruits and vegetables in resealable bags. ?If you do not have access to quality fresh seafood, buy precooked frozen shrimp or canned fish, such as tuna, salmon, or sardines. ?Buy small amounts of raw or cooked vegetables, salads, or olives from the deli or salad bar at your store. ?Stock your pantry so you always have certain foods on hand, such as olive oil, canned tuna, canned tomatoes, rice, pasta, and beans. ?Cooking  ?Cook foods with extra-virgin  olive oil instead of using butter or other vegetable oils. ?Have meat as a side dish, and have vegetables or grains as your main dish. This means having meat in small portions or adding small amounts of meat to foods like pasta or stew. ?Use beans or vegetables instead of meat in common dishes like chili or lasagna. ?Experiment with different cooking methods. Try roasting or broiling vegetables instead of steaming or saut?eing them. ?Add frozen vegetables to soups,  stews, pasta, or rice. ?Add nuts or seeds for added healthy fat at each meal. You can add these to yogurt, salads, or vegetable dishes. ?Marinate fish or vegetables using olive oil, lemon juice, garlic, and fre

## 2021-10-01 ENCOUNTER — Telehealth: Payer: Self-pay | Admitting: Internal Medicine

## 2021-10-01 NOTE — Telephone Encounter (Signed)
Called on-call line today per pt dx'ed with covid in 03/2021 ans since this dry mouth and throat worse on the right side of through and having a worse cough x 1 month and gagging worse   Rec urgent care today  10/01/21 if worsening her PCPs office will be open 10/02/21 can also call for an appt 10/02/21 to f/u on this issue  Dr. Olivia Mackie McLean-Scocuzza

## 2021-10-02 ENCOUNTER — Ambulatory Visit (INDEPENDENT_AMBULATORY_CARE_PROVIDER_SITE_OTHER): Payer: PPO | Admitting: Family

## 2021-10-02 ENCOUNTER — Ambulatory Visit (HOSPITAL_BASED_OUTPATIENT_CLINIC_OR_DEPARTMENT_OTHER)
Admission: RE | Admit: 2021-10-02 | Discharge: 2021-10-02 | Disposition: A | Discharge status: Home / Self Care | Payer: PPO | Source: Ambulatory Visit | Attending: Family | Admitting: Family

## 2021-10-02 VITALS — BP 145/97 | HR 56 | Temp 98.7°F | Resp 16 | Wt 168.0 lb

## 2021-10-02 DIAGNOSIS — R059 Cough, unspecified: Secondary | ICD-10-CM | POA: Diagnosis not present

## 2021-10-02 MED ORDER — BENZONATATE 100 MG PO CAPS: 100.0000 mg | ORAL_CAPSULE | Freq: Two times a day (BID) | ORAL | 1 refills | Status: DC | PRN

## 2021-10-02 NOTE — Assessment & Plan Note (Signed)
Prolonged cough following covid-19 infection. Advised pt as follows:  Start tessalon prn Complete chest x-ray on the first floor. Start claritin '10mg'$  once daily for allergy symptoms. Refer to pulmonology for further evaluation.

## 2021-10-02 NOTE — Progress Notes (Signed)
Subjective:   By signing my name below, I, Carylon Perches, attest that this documentation has been prepared under the direction and in the presence of Karie Chimera, NP 10/02/2021     Patient ID: Brittany Salinas, female    DOB: [birth year], 71 y.o.   MRN: [id number]  Chief Complaint  Patient presents with   Cough    Patient complains of persistent cough since November.     HPI Patient is in today for an office visit.  Cough - She complains of a cough that begun since she had Covid in 03/2021. She also states that her cough occasionally worsens to the point where she vomits. She states that symptoms are worse on the right side of her throat. She describes her cough as starting out as dry but progressively gets wetter as phlegm builds up. She also notes that she has not regained her complete sense of smell and taste. She denies of any worsening symptoms of allergies or heartburn.    Health Maintenance Due  Topic Date Due   TETANUS/TDAP  Never done   Zoster Vaccines- Shingrix (1 of 2) Never done   Pneumonia Vaccine 80+ Years old (1 - PCV) Never done   COVID-19 Vaccine (4 - Booster for [employer] series) 09/24/2020    Past Medical History:  Diagnosis Date   Arthritis    OA   Blood transfusion without reported diagnosis 9373   Complications c-section   Cataracts, both eyes    Depression    Family history of adverse reaction to anesthesia    mother and sister - slow to awaken   Hyperlipidemia    Hypertension    Insomnia    Memory loss    Right foot pain 10/09/2017   Vision abnormalities     Past Surgical History:  Procedure Laterality Date   BREAST BIOPSY Right    benign   BREAST REDUCTION SURGERY  2007   CESAREAN SECTION     4 c-sections   COLONOSCOPY  2008   COLONOSCOPY W/ POLYPECTOMY  11/15/2011   Dr. Marella Chimes. [facility]. A single semi-pedunculated non-bleeding polyp of benigh apperance was found in the distal ascending colon. A single-piece  polypectomy was performed using hot snare in the distal ascending colon. The polyp was completely removed.   EYE SURGERY Bilateral    catarcts   FRACTURE SURGERY  Oct 2021   Shattered elbow   ORIF ELBOW FRACTURE Left 02/12/2020   Procedure: Left elbow olecranon osteotomy, ulnar nerve release, and surgical reconstruction distal end of the humerus with repair;  Surgeon: Roseanne Kaufman, MD;  Location: [facility];  Service: Orthopedics;  Laterality: Left;  2.5 hrs   REDUCTION MAMMAPLASTY Bilateral    ROTATOR CUFF REPAIR Bilateral    right in 2010, left in 2017.   TONSILLECTOMY     TUBAL LIGATION      Family History  Problem Relation Age of Onset   Colon polyps Mother    Heart defect Mother        valvular heart disease   Depression Mother    Heart attack Father    Hyperlipidemia Father    Hypertension Father    Colon polyps Sister    Marfan syndrome Sister    Other Sister        connective tissure disorder   Hypertension Brother    Other Brother        vertigo   Psoriasis Daughter    Mental illness Son  depression alcohol in past   Depression Son    Arrhythmia Son    Alcohol abuse Son        h/o drug abuse   Breast cancer Maternal Aunt    Cancer Maternal Grandmother        stomach   Asthma Maternal Grandfather    Stroke Maternal Grandfather    Marfan syndrome Maternal Grandfather        ?   Alcohol abuse Maternal Grandfather    Colon cancer Neg Hx    Esophageal cancer Neg Hx    Rectal cancer Neg Hx    Stomach cancer Neg Hx     Social History   Socioeconomic History   Marital status: Married    Spouse name: Not on file   Number of children: Not on file   Years of education: 14   Highest education level: Not on file  Occupational History   Not on file  Tobacco Use   Smoking status: Never   Smokeless tobacco: Never  Vaping Use   Vaping Use: Never used  Substance and Sexual Activity   Alcohol use: Yes    Alcohol/week: 2.0 standard drinks    Types: 2  Glasses of wine per week   Drug use: No   Sexual activity: Not on file  Other Topics Concern   Not on file  Social History Narrative   Works as Geophysicist/field seismologist, 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 Uses occasional light alcohol   Right handed   Social Determinants of Health   Financial Resource Strain: Low Risk    Difficulty of Paying Living Expenses: Not hard at all  Food Insecurity: No Food Insecurity   Worried About Charity fundraiser in the Last Year: Never true   Ran Out of Food in the Last Year: Never true  Transportation Needs: No Transportation Needs   Lack of Transportation (Medical): No   Lack of Transportation (Non-Medical): No  Physical Activity: Insufficiently Active   Days of Exercise per Week: 3 days   Minutes of Exercise per Session: 30 min  Stress: No Stress Concern Present   Feeling of Stress : Not at all  Social Connections: Socially Integrated   Frequency of Communication with Friends and Family: More than three times a week   Frequency of Social Gatherings with Friends and Family: More than three times a week   Attends Religious Services: More than 4 times per year   Active Member of [employer] or Organizations: Yes   Attends Music therapist: More than 4 times per year   Marital Status: Married  Human resources officer Violence: Not At Risk   Fear of Current or Ex-Partner: No   Emotionally Abused: No   Physically Abused: No   Sexually Abused: No    Outpatient Medications Prior to Visit  Medication Sig Dispense Refill   Ascorbic Acid (VITAMIN C) 1000 MG tablet Take 1,000 mg by mouth daily.     aspirin EC 81 MG tablet Take 81 mg by mouth daily. Swallow whole.     buPROPion (WELLBUTRIN XL) 300 MG 24 hr tablet TAKE 1 TABLET BY MOUTH EVERY DAY 90 tablet 1   Cholecalciferol (VITAMIN D3) 125 MCG (5000 UT) TABS Take 5,000 Units by mouth daily.     cyanocobalamin 100 MCG tablet Take 100 mcg by mouth daily.     Krill Oil 500 MG  CAPS Take 500 mg by mouth daily.     magnesium oxide (  MAG-OX) 400 MG tablet Take 400 mg by mouth at bedtime.     meclizine (ANTIVERT) 12.5 MG tablet Take 1 tablet (12.5 mg total) by mouth 3 (three) times daily as needed for dizziness. 30 tablet 0   Melatonin 10 MG TABS Take 10 mg by mouth at bedtime.     metoprolol succinate (TOPROL-XL) 50 MG 24 hr tablet Take 1 tablet (50 mg total) by mouth daily. Take with or immediately following a meal. 90 tablet 1   niacin 500 MG tablet Take 500 mg by mouth daily.     rosuvastatin (CRESTOR) 10 MG tablet TAKE 1 TABLET BY MOUTH EVERY DAY 90 tablet 1   sertraline (ZOLOFT) 50 MG tablet TAKE 1 TABLET BY MOUTH EVERY DAY 90 tablet 1   TURMERIC CURCUMIN PO Take 750 mg by mouth daily.     No facility-administered medications prior to visit.    Allergies  Allergen Reactions   Codeine Nausea And Vomiting    Review of Systems  HENT:  Positive for congestion.   Respiratory:  Positive for cough.   Gastrointestinal:  Positive for vomiting.  Endo/Heme/Allergies:  Negative for environmental allergies.      Objective:    Physical Exam Constitutional:      General: She is not in acute distress.    Appearance: Normal appearance. She is not ill-appearing.  HENT:     Head: Normocephalic and atraumatic.     Right Ear: External ear normal.     Left Ear: External ear normal.  Eyes:     Extraocular Movements: Extraocular movements intact.     Pupils: Pupils are equal, round, and reactive to light.  Neck:     Thyroid: No thyromegaly.  Cardiovascular:     Rate and Rhythm: Normal rate and regular rhythm.     Heart sounds: Normal heart sounds. No murmur heard.   No gallop.  Pulmonary:     Effort: Pulmonary effort is normal. No respiratory distress.     Breath sounds: No wheezing or rales.  Lymphadenopathy:     Cervical: No cervical adenopathy.  Skin:    General: Skin is warm and dry.  Neurological:     Mental Status: She is alert and oriented to person,  place, and time.  Psychiatric:        Mood and Affect: Mood normal.        Behavior: Behavior normal.        Judgment: Judgment normal.    BP (!) 145/97 (BP Location: Right Arm, Patient Position: Sitting, Cuff Size: Small)   Pulse (!) 56   Temp 98.7 F (37.1 C) (Oral)   Resp 16   Wt 168 lb (76.2 kg)   SpO2 100%   BMI 28.84 kg/m  Wt Readings from Last 3 Encounters:  10/02/21 168 lb (76.2 kg)  09/12/21 165 lb (74.8 kg)  08/30/21 167 lb 6.4 oz (75.9 kg)       Assessment & Plan:   Problem List Items Addressed This Visit       Unprioritized   Cough - Primary    Prolonged cough following covid-19 infection. Advised pt as follows:  Start tessalon prn Complete chest x-ray on the first floor. Start claritin '10mg'$  once daily for allergy symptoms. Refer to pulmonology for further evaluation.        Relevant Medications   benzonatate (TESSALON) 100 MG capsule   Other Relevant Orders   Ambulatory referral to Pulmonology   DG Chest 2 View  Meds ordered this encounter  Medications   benzonatate (TESSALON) 100 MG capsule    Sig: Take 1 capsule (100 mg total) by mouth 2 (two) times daily as needed for cough.    Dispense:  30 capsule    Refill:  1    Order Specific Question:   Supervising Provider    Answer:   Penni Homans A [[id number]]    I, Nance Pear, NP, personally preformed the services described in this documentation.  All medical record entries made by the scribe were at my direction and in my presence.  I have reviewed the chart and discharge instructions (if applicable) and agree that the record reflects my personal performance and is accurate and complete. 10/02/2021   I,Amber Collins,acting as a scribe for Nance Pear, NP.,have documented all relevant documentation on the behalf of Nance Pear, NP,as directed by  Nance Pear, NP while in the presence of Nance Pear, NP.    Nance Pear, NP

## 2021-10-10 ENCOUNTER — Ambulatory Visit
Admission: RE | Admit: 2021-10-10 | Discharge: 2021-10-10 | Disposition: A | Discharge status: Home / Self Care | Payer: PPO | Source: Ambulatory Visit | Attending: Physician Assistant | Admitting: Physician Assistant

## 2021-10-10 DIAGNOSIS — I6782 Cerebral ischemia: Secondary | ICD-10-CM | POA: Diagnosis not present

## 2021-10-10 DIAGNOSIS — R413 Other amnesia: Secondary | ICD-10-CM | POA: Diagnosis not present

## 2021-10-10 MED ORDER — GADOBENATE DIMEGLUMINE 529 MG/ML IV SOLN
15.0000 mL | Freq: Once | INTRAVENOUS | Status: AC | PRN
  Administered 2021-10-10: 15 mL via INTRAVENOUS

## 2021-10-17 ENCOUNTER — Encounter: Payer: Self-pay | Admitting: Physician Assistant

## 2021-10-17 ENCOUNTER — Ambulatory Visit: Payer: PPO | Admitting: Physician Assistant

## 2021-10-17 VITALS — BP 144/77 | HR 95 | Resp 18 | Ht 64.0 in | Wt 168.0 lb

## 2021-10-17 DIAGNOSIS — G3184 Mild cognitive impairment, so stated: Secondary | ICD-10-CM | POA: Diagnosis not present

## 2021-10-17 NOTE — Progress Notes (Signed)
Assessment/Plan:   Mild Cognitive Impairment   Brittany Salinas is a very pleasant 71 y.o. RH female  seen today in follow up for memory loss. history of hypertension, hyperlipidemia, depression, osteoarthritis, history of colon polyps s/p resection of these polyps March 2023. Patient has a history of mild cognitive impairment dating back to 2017 at which time an MRI of the brain (reviewed by me), revealed mild perisylvian and temporal atrophy, mild periventricular and subcortical chronic small vessel ischemic disease.  At the time, [facility] was recommended but patient declined. During her visit on 09/12/21 her MoCA was 26/30, delayed recall 4/5 .MRI brain reviewed by me on 10/11/21 was without acute findings, no abnormal enhancement, or parenchymal volume loss and mild chronimicrovascular ischemic changes were similar to prior MRI in 2017.  She is not on antidementia medication.  B12 at the time was 152.  Since her last visit, the patient actively states that her memory has improved since starting B12 replenishment, she feels an increase in energy makes her "feel better ".  She is also making some changes in her diet, in view of elevated triglycerides in the latest labs.     Mild cognitive impairment  Neurocognitive testing is pending, follow-up for clarity of diagnosis and disease trajectory. Continue B12 supplementation  Follow-up in 6 months    Gait instability-disturbance This is dating back to around 2015, but since then, she had sustained several falls.  She is on physical therapy and increasing her level of activity which seems to be helping.  No strokelike symptoms, or dizziness.  She has chronic feet issues that contribute to her gait instability, including bunions or other arthritic deformities.  MRI of the brain is without acute finding suggestive of a stroke.  Continue physical therapy  Case discussed with Dr. Delice Lesch who agrees with the plan     Subjective:    This  patient is accompanied in the office by her who supplements the history.  Previous records as well as any outside records available were reviewed prior to todays visit.  Patient was last seen at our office on 09/12/2021 at which time her MoCA was 26/3.Patient is not on antidementia medication.   Any changes in memory since last visit?  "Slight improvement, enough I can notice, I can now participate in conversation, and even find the right word ".  "I and now taking B12, and enjoying gardening in the sun a lot, lifts my mood, sleep better ".  She is also able to recall appointments.  She likes to do sodoku and crossword puzzles now.  She is also reading more. Patient lives with: Spouse  repeats oneself? Less than before Disoriented when walking into a room?  Patient denies   Leaving objects in unusual places?  Patient denies   Ambulates  with difficulty?  The patient has some difficulty with balance dating back to 2015, in view of "bunions, arthritis which at times become very painful so I have to find a way to walk ".  She feels that the "shuffling is improved ".  She is participating in some physical activities and for a while she did PT.  She is seeing a foot doctor and orthopedist soon. Recent falls?  Patient denies   Any head injuries?  Patient denies   History of seizures?   Patient denies   Wandering behavior?  Patient denies   Patient drives?  The patient drives without any difficulty, denies getting lost.  "Is not as nerve-racking as before ".  Any mood changes such irritability agitation?  Patient denies   Any history of depression?:  She has a history of depression, on Zoloft and Wellbutrin, well controlled.   Hallucinations?  Patient denies.  She uses 5 mg of melatonin and magnesium to help her sleep. Paranoia?  Patient denies   Patient reports that he sleeps well without vivid dreams, REM behavior or sleepwalking    History of sleep apnea?  Patient denies   Any hygiene concerns?   Patient denies   Independent of bathing and dressing?  Endorsed  Does the patient needs help with medications?  She denies missing any doses.  She is in control of her medications. Who is in charge of the finances?  Patient is in charge, she is feeling more confident about doing the finances. Any changes in appetite?  Patient denies, she drinks plenty water. Patient have trouble swallowing? Patient denies   Does the patient cook?  Patient denies   Any kitchen accidents such as leaving the stove on? Patient denies   Any headaches?  Patient denies   The double vision? Patient denies   Any focal numbness or tingling?  Patient denies   Chronic back pain and neck pain, sees a chiropractor.   Unilateral weakness?  Patient denies   Any tremors?  Patient denies   Any history of anosmia? Endorsed Any incontinence of urine?  She has a history of stress incontinence, for the last 3 years she uses a pad. Any bowel dysfunction?   Patient denies   MRI brain 10/11/21 No evidence of recent infarction, hemorrhage, or mass. No abnormal enhancement.Similar generalized parenchymal volume loss and mild chronic microvascular ischemic changes.  MRI of the brain 04/22/16 (reviewed by me) revealed mild perisylvian and anterior temporal atrophy, mild periventricular and subcortical chronic small vessel ischemic disease, otherwise no acute findings.  Labs B12 152.   Past Medical History:  Diagnosis Date   Arthritis    OA   Blood transfusion without reported diagnosis 4098   Complications c-section   Cataracts, both eyes    Depression    Family history of adverse reaction to anesthesia    mother and sister - slow to awaken   Hyperlipidemia    Hypertension    Insomnia    Memory loss    Right foot pain 10/09/2017   Vision abnormalities      Past Surgical History:  Procedure Laterality Date   BREAST BIOPSY Right    benign   BREAST REDUCTION SURGERY  2007   CESAREAN SECTION     4 c-sections    COLONOSCOPY  2008   COLONOSCOPY W/ POLYPECTOMY  11/15/2011   Dr. Marella Chimes. [facility]. A single semi-pedunculated non-bleeding polyp of benigh apperance was found in the distal ascending colon. A single-piece polypectomy was performed using hot snare in the distal ascending colon. The polyp was completely removed.   EYE SURGERY Bilateral    catarcts   FRACTURE SURGERY  Oct 2021   Shattered elbow   ORIF ELBOW FRACTURE Left 02/12/2020   Procedure: Left elbow olecranon osteotomy, ulnar nerve release, and surgical reconstruction distal end of the humerus with repair;  Surgeon: Roseanne Kaufman, MD;  Location: [facility];  Service: Orthopedics;  Laterality: Left;  2.5 hrs   REDUCTION MAMMAPLASTY Bilateral    ROTATOR CUFF REPAIR Bilateral    right in 2010, left in 2017.   TONSILLECTOMY     TUBAL LIGATION       PREVIOUS MEDICATIONS:   CURRENT MEDICATIONS:  Outpatient Encounter Medications as of 10/17/2021  Medication Sig   Ascorbic Acid (VITAMIN C) 1000 MG tablet Take 1,000 mg by mouth daily.   aspirin EC 81 MG tablet Take 81 mg by mouth daily. Swallow whole.   benzonatate (TESSALON) 100 MG capsule Take 1 capsule (100 mg total) by mouth 2 (two) times daily as needed for cough.   buPROPion (WELLBUTRIN XL) 300 MG 24 hr tablet TAKE 1 TABLET BY MOUTH EVERY DAY   Cholecalciferol (VITAMIN D3) 125 MCG (5000 UT) TABS Take 5,000 Units by mouth daily.   cyanocobalamin 100 MCG tablet Take 100 mcg by mouth daily.   Krill Oil 500 MG CAPS Take 500 mg by mouth daily.   magnesium oxide (MAG-OX) 400 MG tablet Take 400 mg by mouth at bedtime.   meclizine (ANTIVERT) 12.5 MG tablet Take 1 tablet (12.5 mg total) by mouth 3 (three) times daily as needed for dizziness.   Melatonin 10 MG TABS Take 10 mg by mouth at bedtime.   metoprolol succinate (TOPROL-XL) 50 MG 24 hr tablet Take 1 tablet (50 mg total) by mouth daily. Take with or immediately following a meal.   niacin 500 MG tablet Take 500 mg by mouth daily.    rosuvastatin (CRESTOR) 10 MG tablet TAKE 1 TABLET BY MOUTH EVERY DAY   sertraline (ZOLOFT) 50 MG tablet TAKE 1 TABLET BY MOUTH EVERY DAY   TURMERIC CURCUMIN PO Take 750 mg by mouth daily.   No facility-administered encounter medications on file as of 10/17/2021.     Objective:     PHYSICAL EXAMINATION:    VITALS:   Vitals:   10/17/21 0925  BP: (!) 144/77  Pulse: 95  Resp: 18  SpO2: 98%  Weight: 168 lb (76.2 kg)  Height: '5\' 4"'$  (1.626 m)    GEN:  The patient appears stated age and is in NAD. HEENT:  Normocephalic, atraumatic.   Neurological examination:  General: NAD, well-groomed, appears stated age. Orientation: The patient is alert. Oriented to person, place and date Cranial nerves: There is good facial symmetry.The speech is fluent and clear. No aphasia or dysarthria. Fund of knowledge is appropriate. Recent memory impaired and remote memory is normal.  Attention and concentration are normal.  Able to name objects and repeat phrases.  Hearing is intact to conversational tone.    Sensation: Sensation is intact to light touch throughout Motor: Strength is at least antigravity x4. Tremors: none  DTR's 2/4 in UE/LE      05/13/2017    3:29 PM 04/18/2016   10:59 AM  Montreal Cognitive Assessment   Visuospatial/ Executive (0/5) 4 4  Naming (0/3) 3 3  Attention: Read list of digits (0/2) 2 2  Attention: Read list of letters (0/1) 1 1  Attention: Serial 7 subtraction starting at 100 (0/3) 3 3  Language: Repeat phrase (0/2) 2 2  Language : Fluency (0/1) 1 1  Abstraction (0/2) 2 2  Delayed Recall (0/5) 5 2  Orientation (0/6) 6 6  Total 29 26  Adjusted Score (based on education) 29 26        No data to display             Movement examination: Tone: There is normal tone in the UE/LE Abnormal movements:  no tremor.  No myoclonus.  No asterixis.   Coordination:  There is no decremation with RAM's. Normal finger to nose  Gait and Station: The patient has no  difficulty arising out of a deep-seated chair without  the use of the hands. The patient's stride length is good.  Gait is cautious and narrow.   Thank you for allowing [place] the opportunity to participate in the care of this nice patient. Please do not hesitate to contact us for any questions or concerns.   Total time spent on today's visit was 31 minutes dedicated to this patient today, preparing to see patient, examining the patient, ordering tests and/or medications and counseling the patient, documenting clinical information in the EHR or other health record, independently interpreting results and communicating results to the patient/family, discussing treatment and goals, answering patient's questions and coordinating care.  Cc:  Mosie Lukes, MD  Sharene Butters 10/17/2021 9:54 AM   Cc:  Mosie Lukes, MD Sharene Butters, PA-C

## 2021-10-17 NOTE — Patient Instructions (Addendum)
It was a pleasure to see you today at our office.   Recommendations:  Follow up in 6  months  Whom to call:  Memory  decline, memory medications: Call our office [phone]   For psychiatric meds, mood meds: Please have your primary care physician manage these medications.   Counseling regarding caregiver distress, including caregiver depression, anxiety and issues regarding community resources, adult day care programs, adult living facilities, or memory care questions:   Feel free to contact [place], Social Worker at [phone]   If you have any severe symptoms of a stroke, or other severe issues such as confusion,severe chills or fever, etc call 911 or go to the ER as you may need to be evaluated further      RECOMMENDATIONS FOR ALL PATIENTS WITH MEMORY PROBLEMS: 1. Continue to exercise (Recommend 30 minutes of walking everyday, or 3 hours every week) 2. Increase social interactions - continue going to [place] and enjoy social gatherings with friends and family 3. Eat healthy, avoid fried foods and eat more fruits and vegetables 4. Maintain adequate blood pressure, blood sugar, and blood cholesterol level. Reducing the risk of stroke and cardiovascular disease also helps promoting better memory. 5. Avoid stressful situations. Live a simple life and avoid aggravations. Organize your time and prepare for the next day in anticipation. 6. Sleep well, avoid any interruptions of sleep and avoid any distractions in the bedroom that may interfere with adequate sleep quality 7. Avoid sugar, avoid sweets as there is a strong link between excessive sugar intake, diabetes, and cognitive impairment We discussed the Mediterranean diet, which has been shown to help patients reduce the risk of progressive memory disorders and reduces cardiovascular risk. This includes eating fish, eat fruits and green leafy vegetables, nuts like almonds and hazelnuts, walnuts, and also use olive oil.  Avoid fast foods and fried foods as much as possible. Avoid sweets and sugar as sugar use has been linked to worsening of memory function.  There is always a concern of gradual progression of memory problems. If this is the case, then we may need to adjust level of care according to patient needs. Support, both to the patient and caregiver, should then be put into place.    FALL PRECAUTIONS: Be cautious when walking. Scan the area for obstacles that may increase the risk of trips and falls. When getting up in the mornings, sit up at the edge of the bed for a few minutes before getting out of bed. Consider elevating the bed at the head end to avoid drop of blood pressure when getting up. Walk always in a well-lit room (use night lights in the walls). Avoid area rugs or power cords from appliances in the middle of the walkways. Use a walker or a cane if necessary and consider physical therapy for balance exercise. Get your eyesight checked regularly.  FINANCIAL OVERSIGHT: Supervision, especially oversight when making financial decisions or transactions is also recommended.  HOME SAFETY: Consider the safety of the kitchen when operating appliances like stoves, microwave oven, and blender. Consider having supervision and share cooking responsibilities until no longer able to participate in those. Accidents with firearms and other hazards in the house should be identified and addressed as well.   ABILITY TO BE LEFT ALONE: If patient is unable to contact 911 operator, consider using [facility], or when the need is there, arrange for someone to stay with patients. Smoking is a fire hazard, consider supervision or cessation. Risk of wandering should  be assessed by caregiver and if detected at any point, supervision and safe proof recommendations should be instituted.  MEDICATION SUPERVISION: Inability to self-administer medication needs to be constantly addressed. Implement a mechanism to ensure safe  administration of the medications.

## 2021-10-25 ENCOUNTER — Encounter: Payer: Self-pay | Admitting: Family Medicine

## 2021-10-25 DIAGNOSIS — R059 Cough, unspecified: Secondary | ICD-10-CM

## 2021-10-25 MED ORDER — BENZONATATE 100 MG PO CAPS: 100.0000 mg | ORAL_CAPSULE | Freq: Two times a day (BID) | ORAL | 1 refills | Status: DC | PRN

## 2021-10-30 DIAGNOSIS — F419 Anxiety disorder, unspecified: Secondary | ICD-10-CM | POA: Diagnosis not present

## 2021-10-30 DIAGNOSIS — E669 Obesity, unspecified: Secondary | ICD-10-CM | POA: Diagnosis not present

## 2021-10-30 DIAGNOSIS — Z7982 Long term (current) use of aspirin: Secondary | ICD-10-CM | POA: Diagnosis not present

## 2021-10-30 DIAGNOSIS — G8929 Other chronic pain: Secondary | ICD-10-CM | POA: Diagnosis not present

## 2021-10-30 DIAGNOSIS — E663 Overweight: Secondary | ICD-10-CM | POA: Diagnosis not present

## 2021-10-30 DIAGNOSIS — R42 Dizziness and giddiness: Secondary | ICD-10-CM | POA: Diagnosis not present

## 2021-10-30 DIAGNOSIS — E785 Hyperlipidemia, unspecified: Secondary | ICD-10-CM | POA: Diagnosis not present

## 2021-10-30 DIAGNOSIS — G4700 Insomnia, unspecified: Secondary | ICD-10-CM | POA: Diagnosis not present

## 2021-10-30 DIAGNOSIS — F331 Major depressive disorder, recurrent, moderate: Secondary | ICD-10-CM | POA: Diagnosis not present

## 2021-10-30 DIAGNOSIS — I10 Essential (primary) hypertension: Secondary | ICD-10-CM | POA: Diagnosis not present

## 2021-11-02 DIAGNOSIS — H524 Presbyopia: Secondary | ICD-10-CM | POA: Diagnosis not present

## 2021-11-02 DIAGNOSIS — Z961 Presence of intraocular lens: Secondary | ICD-10-CM | POA: Diagnosis not present

## 2021-11-25 ENCOUNTER — Other Ambulatory Visit: Payer: Self-pay | Admitting: Family Medicine

## 2021-12-06 DIAGNOSIS — S92534A Nondisplaced fracture of distal phalanx of right lesser toe(s), initial encounter for closed fracture: Secondary | ICD-10-CM | POA: Diagnosis not present

## 2021-12-08 ENCOUNTER — Other Ambulatory Visit: Payer: Self-pay | Admitting: Family Medicine

## 2021-12-11 DIAGNOSIS — M25571 Pain in right ankle and joints of right foot: Secondary | ICD-10-CM | POA: Diagnosis not present

## 2021-12-14 DIAGNOSIS — S92341A Displaced fracture of fourth metatarsal bone, right foot, initial encounter for closed fracture: Secondary | ICD-10-CM | POA: Diagnosis not present

## 2021-12-14 DIAGNOSIS — S92351A Displaced fracture of fifth metatarsal bone, right foot, initial encounter for closed fracture: Secondary | ICD-10-CM | POA: Diagnosis not present

## 2021-12-14 DIAGNOSIS — S92353A Displaced fracture of fifth metatarsal bone, unspecified foot, initial encounter for closed fracture: Secondary | ICD-10-CM

## 2021-12-14 DIAGNOSIS — S92343A Displaced fracture of fourth metatarsal bone, unspecified foot, initial encounter for closed fracture: Secondary | ICD-10-CM

## 2021-12-14 HISTORY — DX: Displaced fracture of fourth metatarsal bone, unspecified foot, initial encounter for closed fracture: S92.343A

## 2021-12-14 HISTORY — DX: Displaced fracture of fifth metatarsal bone, unspecified foot, initial encounter for closed fracture: S92.353A

## 2021-12-18 ENCOUNTER — Encounter: Payer: Self-pay | Admitting: Psychology

## 2022-01-04 DIAGNOSIS — S92351D Displaced fracture of fifth metatarsal bone, right foot, subsequent encounter for fracture with routine healing: Secondary | ICD-10-CM | POA: Diagnosis not present

## 2022-01-04 DIAGNOSIS — S92351A Displaced fracture of fifth metatarsal bone, right foot, initial encounter for closed fracture: Secondary | ICD-10-CM | POA: Diagnosis not present

## 2022-01-04 DIAGNOSIS — S92341D Displaced fracture of fourth metatarsal bone, right foot, subsequent encounter for fracture with routine healing: Secondary | ICD-10-CM | POA: Diagnosis not present

## 2022-01-04 DIAGNOSIS — M25551 Pain in right hip: Secondary | ICD-10-CM

## 2022-01-04 HISTORY — DX: Pain in right hip: M25.551

## 2022-01-21 DIAGNOSIS — S92351D Displaced fracture of fifth metatarsal bone, right foot, subsequent encounter for fracture with routine healing: Secondary | ICD-10-CM | POA: Diagnosis not present

## 2022-01-21 DIAGNOSIS — S92341D Displaced fracture of fourth metatarsal bone, right foot, subsequent encounter for fracture with routine healing: Secondary | ICD-10-CM | POA: Diagnosis not present

## 2022-01-21 DIAGNOSIS — M5416 Radiculopathy, lumbar region: Secondary | ICD-10-CM | POA: Diagnosis not present

## 2022-01-21 DIAGNOSIS — M25551 Pain in right hip: Secondary | ICD-10-CM | POA: Diagnosis not present

## 2022-01-22 DIAGNOSIS — M5416 Radiculopathy, lumbar region: Secondary | ICD-10-CM

## 2022-01-22 HISTORY — DX: Radiculopathy, lumbar region: M54.16

## 2022-01-24 DIAGNOSIS — M25551 Pain in right hip: Secondary | ICD-10-CM | POA: Diagnosis not present

## 2022-01-29 DIAGNOSIS — M5416 Radiculopathy, lumbar region: Secondary | ICD-10-CM | POA: Diagnosis not present

## 2022-01-29 DIAGNOSIS — M25551 Pain in right hip: Secondary | ICD-10-CM | POA: Diagnosis not present

## 2022-01-29 DIAGNOSIS — S92351D Displaced fracture of fifth metatarsal bone, right foot, subsequent encounter for fracture with routine healing: Secondary | ICD-10-CM | POA: Diagnosis not present

## 2022-01-29 DIAGNOSIS — S92341D Displaced fracture of fourth metatarsal bone, right foot, subsequent encounter for fracture with routine healing: Secondary | ICD-10-CM | POA: Diagnosis not present

## 2022-01-31 DIAGNOSIS — M25551 Pain in right hip: Secondary | ICD-10-CM | POA: Diagnosis not present

## 2022-02-12 DIAGNOSIS — M479 Spondylosis, unspecified: Secondary | ICD-10-CM | POA: Diagnosis not present

## 2022-02-12 DIAGNOSIS — M5416 Radiculopathy, lumbar region: Secondary | ICD-10-CM | POA: Diagnosis not present

## 2022-02-12 DIAGNOSIS — M25559 Pain in unspecified hip: Secondary | ICD-10-CM | POA: Diagnosis not present

## 2022-02-26 DIAGNOSIS — M5451 Vertebrogenic low back pain: Secondary | ICD-10-CM | POA: Diagnosis not present

## 2022-03-05 DIAGNOSIS — M47816 Spondylosis without myelopathy or radiculopathy, lumbar region: Secondary | ICD-10-CM

## 2022-03-05 DIAGNOSIS — M5416 Radiculopathy, lumbar region: Secondary | ICD-10-CM | POA: Diagnosis not present

## 2022-03-05 HISTORY — DX: Spondylosis without myelopathy or radiculopathy, lumbar region: M47.816

## 2022-03-13 DIAGNOSIS — M5416 Radiculopathy, lumbar region: Secondary | ICD-10-CM | POA: Diagnosis not present

## 2022-03-15 DIAGNOSIS — M79651 Pain in right thigh: Secondary | ICD-10-CM | POA: Diagnosis not present

## 2022-03-15 DIAGNOSIS — M25551 Pain in right hip: Secondary | ICD-10-CM | POA: Diagnosis not present

## 2022-03-18 DIAGNOSIS — M25551 Pain in right hip: Secondary | ICD-10-CM | POA: Diagnosis not present

## 2022-03-18 DIAGNOSIS — M79651 Pain in right thigh: Secondary | ICD-10-CM | POA: Diagnosis not present

## 2022-03-24 IMAGING — CT CT ELBOW*L* W/O CM
2 series · 15 of 27 positions shown, 19 images · non-contrast
Comparison: None available

CLINICAL DATA: Left elbow pain after fall

EXAM:
CT OF THE UPPER LEFT EXTREMITY WITHOUT CONTRAST
TECHNIQUE: Multidetector CT imaging of the upper left extremity was performed
according to the standard protocol.

[Series 5: ext soft · axial · 0.32mm/px · z∈[-302,-156]mm · 10 of 87 slices shown, 13 images]
[im 7/87  soft-tissue]
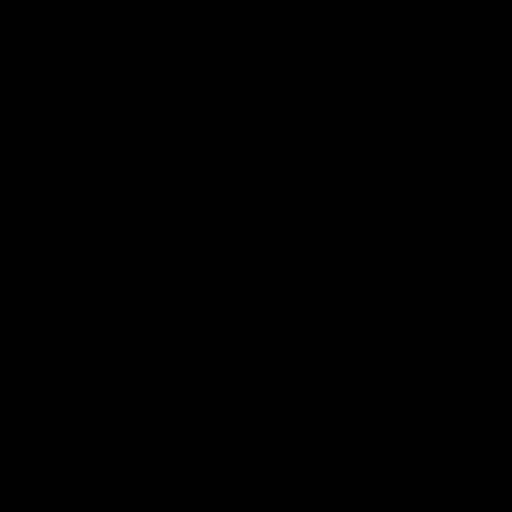
[im 7/87  bone]
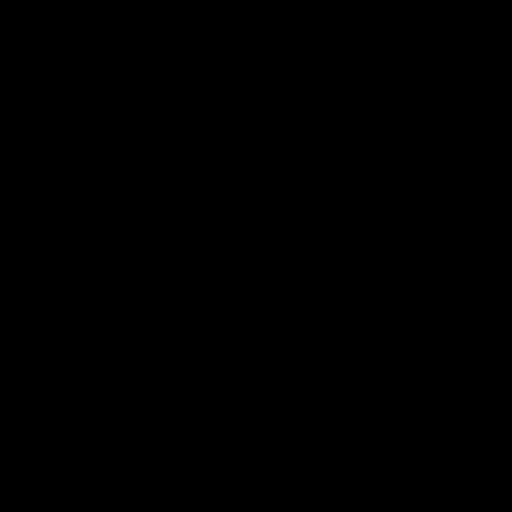
[im 14/87  bone]
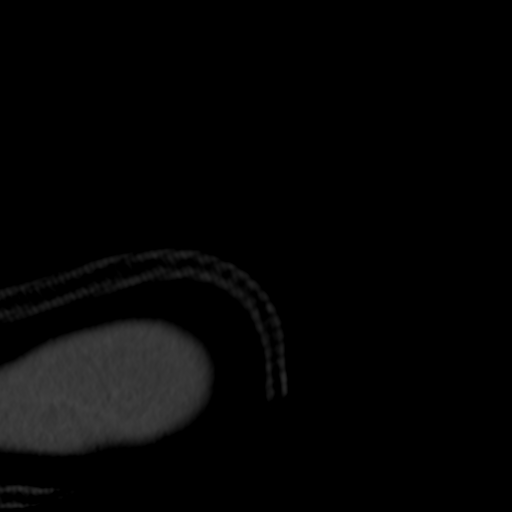
[im 27/87  bone]
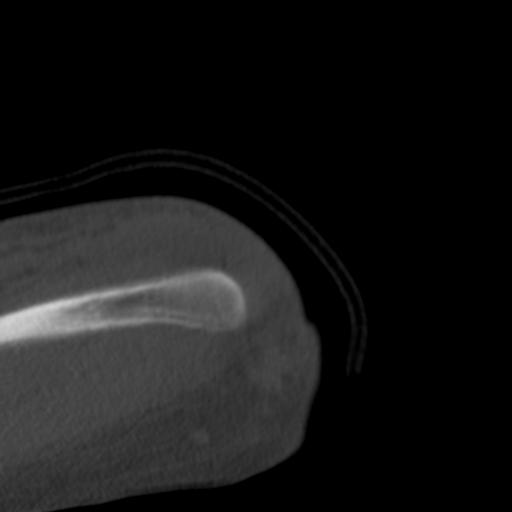
[im 34/87  bone]
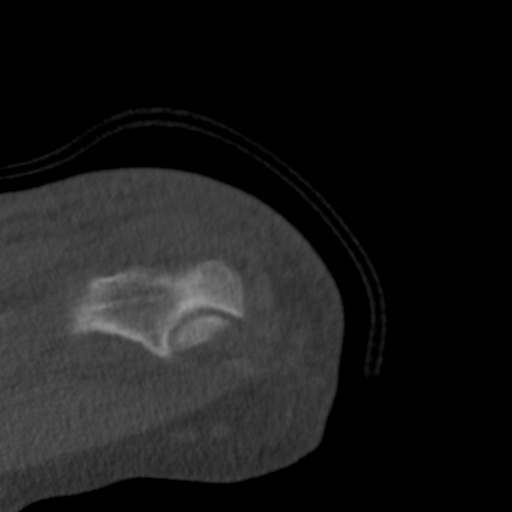
[im 40/87  soft-tissue]
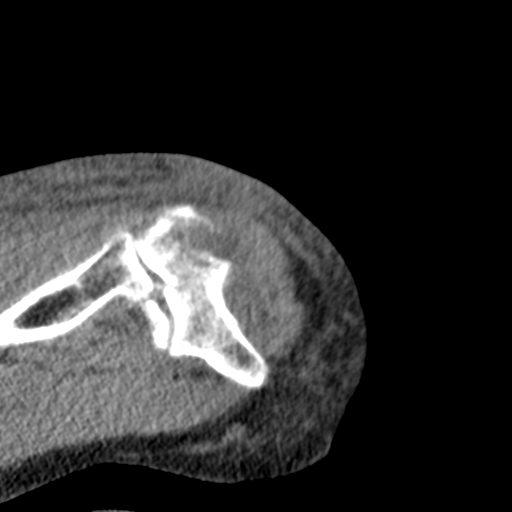
[im 40/87  bone]
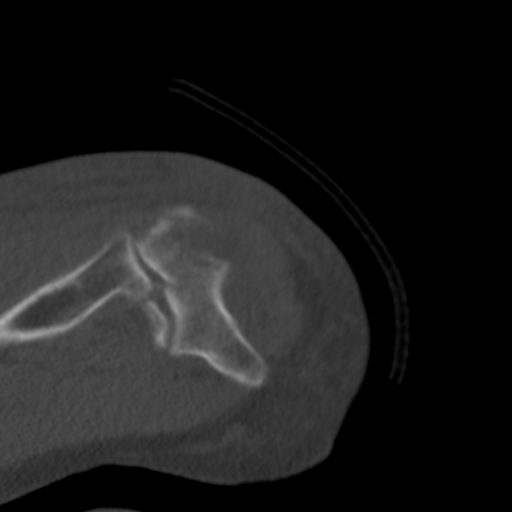
[im 47/87  bone]
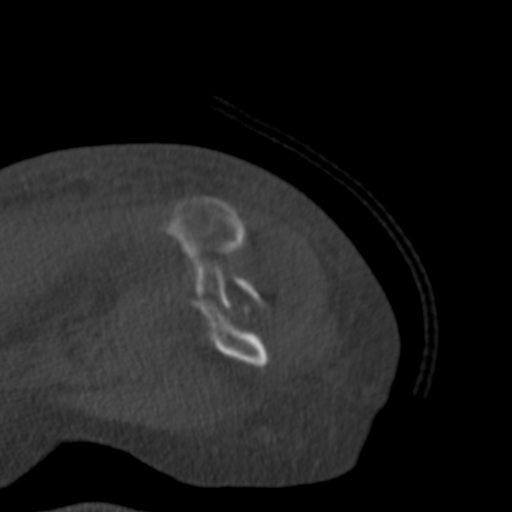
[im 53/87  bone]
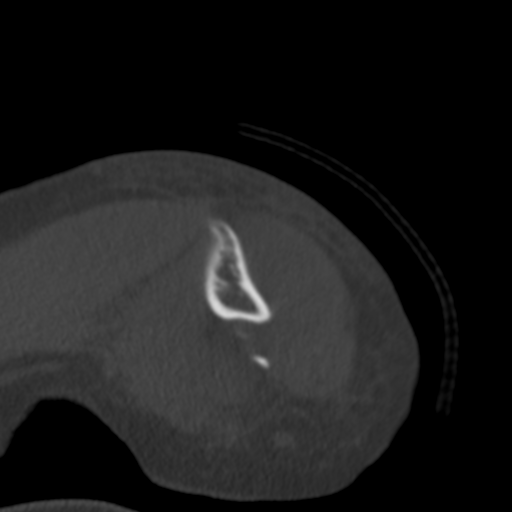
[im 67/87  bone]
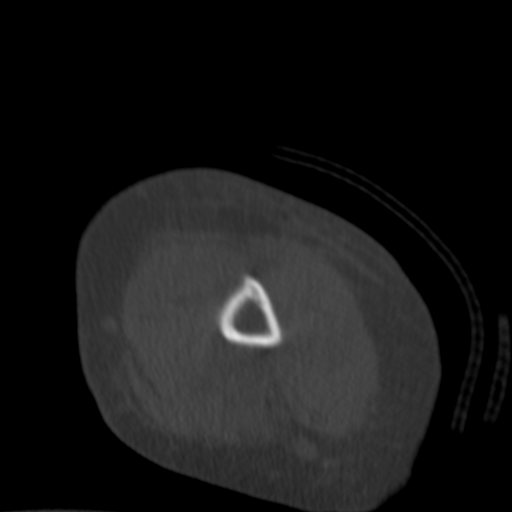
[im 73/87  soft-tissue]
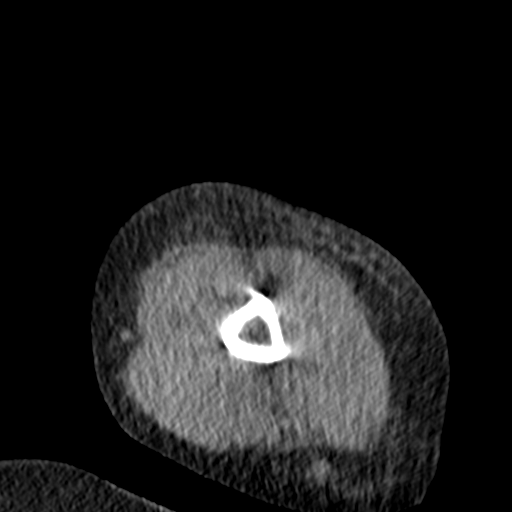
[im 73/87  bone]
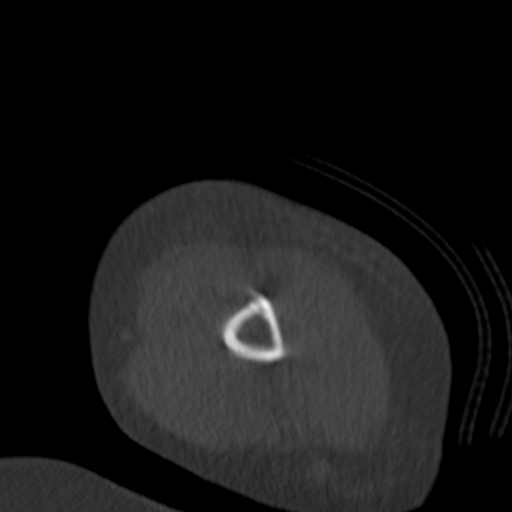
[im 80/87  bone]
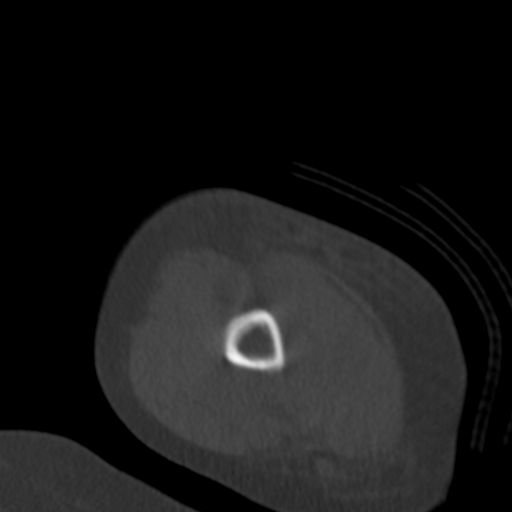

[Series 602: coronal 2 · sagittal · 0.34mm/px · 5 of 47 slices shown, 6 images]
[im 16/47  bone]
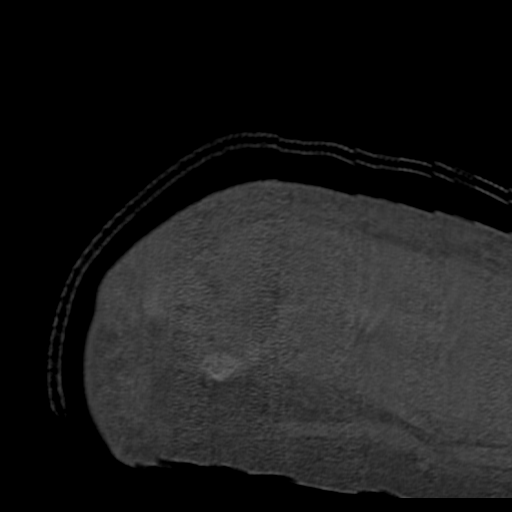
[im 20/47  bone]
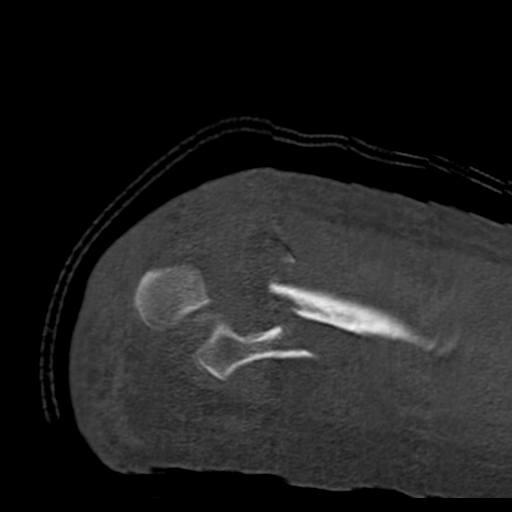
[im 24/47  soft-tissue]
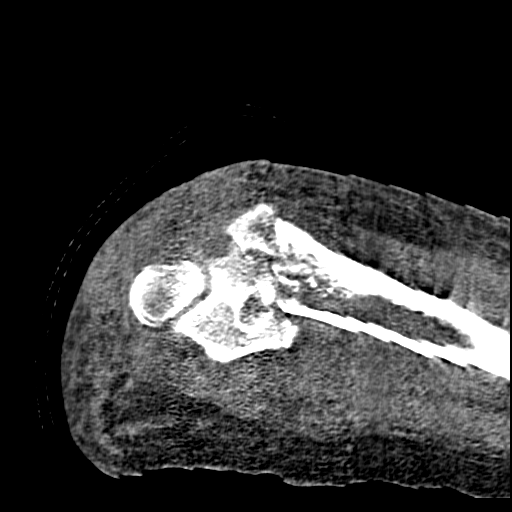
[im 24/47  bone]
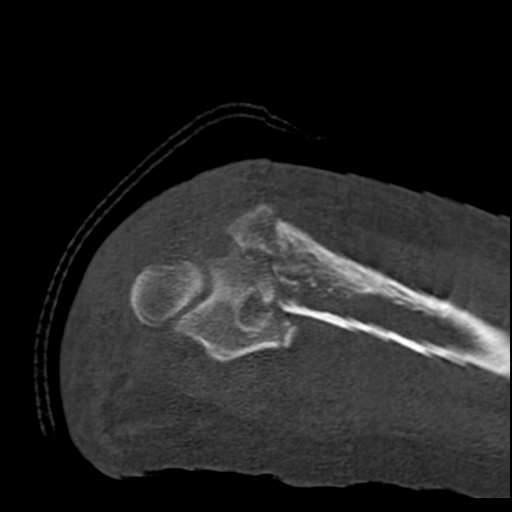
[im 27/47  bone]
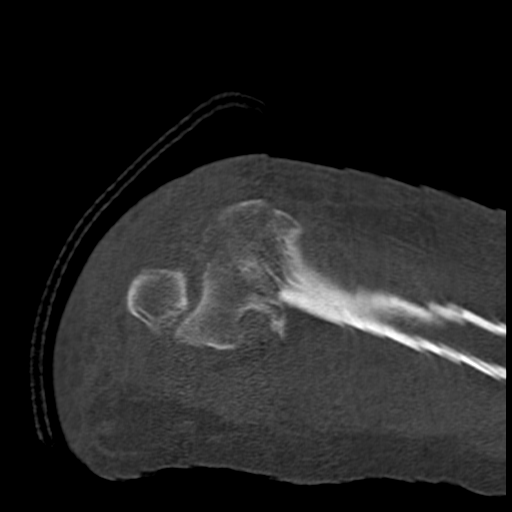
[im 31/47  bone]
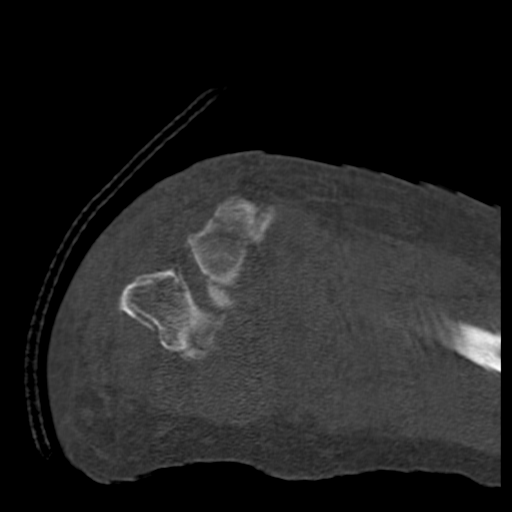

[15 of 27 positions shown; findings below may reference images not displayed]

FINDINGS: Technical note: Examination is degraded by motion artifact.

Bones/Joint/Cartilage

Acute comminuted fracture of the distal left humerus with anterior
and medial displacement. Fracture line extends intra-articularly to
the radiocapitellar joint. No significant angulation. No definite
fractures of the proximal radius or ulna identified on motion
degraded images. Radiocapitellar and ulnotrochlear joint alignment
maintained without dislocation. Large elbow joint hemarthrosis.

Ligaments

Suboptimally assessed by CT.

Muscles and Tendons

Musculotendinous structures are suboptimally evaluated on motion
degraded images.

Soft tissues

Prominent soft tissue swelling and hemorrhage most pronounced at the
posterior aspect of the elbow. No well-defined hematoma.
IMPRESSION: 1. Acute comminuted displaced fracture of the distal left humerus
with intra-articular extension to the radiocapitellar joint.
2. Large elbow joint hemarthrosis.

## 2022-03-26 DIAGNOSIS — M47816 Spondylosis without myelopathy or radiculopathy, lumbar region: Secondary | ICD-10-CM | POA: Diagnosis not present

## 2022-03-26 DIAGNOSIS — M5416 Radiculopathy, lumbar region: Secondary | ICD-10-CM | POA: Diagnosis not present

## 2022-03-26 DIAGNOSIS — M47896 Other spondylosis, lumbar region: Secondary | ICD-10-CM | POA: Diagnosis not present

## 2022-04-05 DIAGNOSIS — M79651 Pain in right thigh: Secondary | ICD-10-CM | POA: Diagnosis not present

## 2022-04-05 DIAGNOSIS — M25551 Pain in right hip: Secondary | ICD-10-CM | POA: Diagnosis not present

## 2022-04-12 DIAGNOSIS — M25551 Pain in right hip: Secondary | ICD-10-CM | POA: Diagnosis not present

## 2022-04-12 DIAGNOSIS — M79651 Pain in right thigh: Secondary | ICD-10-CM | POA: Diagnosis not present

## 2022-04-17 ENCOUNTER — Other Ambulatory Visit: Payer: Self-pay | Admitting: Family Medicine

## 2022-04-17 DIAGNOSIS — M79651 Pain in right thigh: Secondary | ICD-10-CM | POA: Diagnosis not present

## 2022-04-17 DIAGNOSIS — M25551 Pain in right hip: Secondary | ICD-10-CM | POA: Diagnosis not present

## 2022-04-18 ENCOUNTER — Ambulatory Visit (INDEPENDENT_AMBULATORY_CARE_PROVIDER_SITE_OTHER): Payer: PPO | Admitting: Physician Assistant

## 2022-04-18 ENCOUNTER — Encounter: Payer: Self-pay | Admitting: Physician Assistant

## 2022-04-18 VITALS — BP 151/92 | HR 86 | Resp 18 | Ht 64.0 in | Wt 169.0 lb

## 2022-04-18 DIAGNOSIS — R413 Other amnesia: Secondary | ICD-10-CM | POA: Diagnosis not present

## 2022-04-18 NOTE — Progress Notes (Signed)
Assessment/Plan:   Memory Impairment   Brittany Salinas is a very pleasant 71 y.o. RH female  history of hypertension, hyperlipidemia, depression, osteoarthritis, history of colon polyps s/p resection of these polyps March 2023 and a history of mild cognitive impairment dating back to 2017.  Most recent MRI of the brain in June 2023, personally reviewed was remarkable for mild chronic microvascular ischemic changes similar to a prior MRI in 2017.  She is not on antidementia medication at this time.  She continues to replenish her B12.  She is here today in follow-up for memory loss.     Recommendations:   Follow up in 6 months. Patient has Neuropsych evaluation 09/18/2022 for clarity of the diagnosis  Continue B12 supplementation Recommend good control of cardiovascular risk factors Mood control as per PCP    Subjective:   This patient is here alone.  Previous records as well as any outside records available were reviewed prior to todays visit.  She was last seen on 10/17/2021.  Last MoCA on 09/12/2021 was 26/30.    Any changes in memory since last visit?"About the same". Sometimes she may forget recent conversations and names of people, after beginning to take gabapentin after a fall complicated with back pain.  "I am hoping that once I get out of gabapentin, my memory gets better ".  She enjoys doing crossword puzzles, word finding as well as reading.  "My memory could also improve once I start moving around and being able to go outside and enjoy the sun ".   Repeats oneself? Occasionally, when I have to ask about the schedule I am preparing for the following week.   Disoriented when walking into a room?  Patient denies   Leaving objects in unusual places?  Patient denies   Recent falls?  Aug 1st she had a mechanical fall after bumping into a rock and fracture the R last 2 toes, placed in an orthopedic boot, but this was complicated with nerve injury "because the boot was too  tight and misaligned to my back ".  She began taking gabapentin and reports that while taking it, she actually feels more tired, and her balance has been affected by it because "I am sleepy around groggy or, I need to wean it off ".  She is also doing water therapy which she enjoys very much.   Ambulates  with difficulty?  Hair ambulation is improving with physical therapy after her recent fall. Any head injuries?  Patient denies   History of seizures?   Patient denies   Wandering behavior?  Patient denies   Patient drives?  "Got permission from the doctor that the foot is better to drive!  ". Any mood changes since last visit?  Patient denies she does water therapy and that helps her mood as well.  She plans to continue doing it after her therapy is over. Any worsening depression?:    Once I was able to drive I got my mood up.  Hallucinations?  Patient denies  Paranoia?  Patient denies   Patient reports that sleeps well without vivid dreams, REM behavior or sleepwalking however, gabapentin makes her sleepy, having difficulty to wake up easily. History of sleep apnea?  Patient denies   Any hygiene concerns?  Patient denies   Independent of bathing and dressing?  Endorsed  Does the patient needs help with medications?  Patient In charge  Who is in charge of the finances? She splits with husband the views Any changes  in appetite?  Not so good after 2nd Covid since not regaining fully the sense of taste and smell.    Patient have trouble swallowing? Patient denies   Does the patient cook?  Patient denies   Any kitchen accidents such as leaving the stove on? Patient denies   Any headaches?  Patient denies   Double vision? Patient denies   Any focal numbness or tingling?  Patient denies   Chronic back pain  Endorsed, on epidural injection and PT  Unilateral weakness?  Patient denies   Any tremors?  Patient denies   Any history of anosmia?  Patient denies   Any incontinence of urine?  She has a  history of stress incontinence, uses a pad. Any bowel dysfunction?   Patient denies      Patient lives  with husband   Initial evaluation 09/12/2021 How long did patient have memory difficulties?  Since 2015.  She had previously been evaluated in December 2017 at [facility] when she was noting difficulty remembering recent events, had trouble planning and executing a plan.  Patient was working as a Geophysicist/field seismologist, and could not remember the names of the muscles or insertions and she needed a cue.  She is a good reader, and could not recall the titles of the authors.  MoCA at that time was 26/30, with delayed recall 2/5.  Aricept was recommended, but the patient preferred to hold off.   As her MoCA improved to 29/30 as of January 2019, it was felt that her memory issues were likely worsen by depression and insomnia then to a degenerative process such as Alzheimer's disease.  She tried to stay active, eat clean, get regular exercise, and sleep regularly, but her cognitive issues mentioned above remained present.  During a well exam on 08/30/2021, the patient reported worsening short-term memory, and does not suspect that this is due to depression "because it is under control'.       Today she reports memory is worse, trying to keep up with dates on the calendar and appointments, trying to not to plan too much ahead.  "I missed several         birthdays ". She also notices that for the last year her speech is not as fluent as before , "I could put a half a sentence and have to figure the rest of the        sentence.  " I am thinking of a word and another comes out, so I stopped participating on discussions". However, if she reads aloud "it is so much better than         reading in silence". She also does sodoku and crossword puzzles. Patient lives with: Spouse who notices, he is more aware of these changes.  repeats oneself?  Endorsed, not very frequently except when trying to remember appointments she mar ask  several times, for example "[facility] is happening at [facility] this Sunday?" Disoriented when walking into a room?   Denies  Leaving objects in unusual places?   Denies  Ambulates  with difficulty?   Patient has difficulty with gait dating back to 2015, when she reported feeling "a little off balance ".  However, there are days that she has difficulty picking her feet up while walking, unable to walk a straight line, and has to be extra careful to avoid falls.  "  I have bunions, bones moved in both feet and became painful and have caused some trouble walking"  Recent falls?  Endorsed.  She had a recent fall 1 month ago on an unsteady floor. She has had physical therapy for loss of balance before (2018), which was helpful  Any head injuries?   Denies  History of seizures?     Denies  Wandering behavior?    Denies  Patient drives?  Endorsed, prefers to drive short distances, but with GPS" is not nerve wracking as before " Any mood changes such irritability agitation?   Denies    Any history of depression?:  Endorsed.  The patient takes Zoloft and Wellbutrin, well controlled. Hallucinations?  Denies  Paranoia?   Denies  Patient sleeps between 6 and 8 hours, denies vivid dreams or REM behavior.  I wake up by hearing something and he says I didn't hear anything, I heard a squirrel on the room, and the voice of her children for the last year. She is not sure if she is dreaming or if it  hallucination.  She uses 5 mg melatonin and magnesium to help her sleep. History of sleep apnea?  Denies Any hygiene concerns?    Denies  Independent of bathing and dressing?  Endorsed  Does the patient needs help with medications?  Denies. Uses a pillbox   Who is in charge of the finances? More and more I don't feel confident about doing the finances.    Any changes in appetite?  Endorsed, but started with Covid around Nov 2022, with decreased sense of smell and taste "so food doesn't taste good".  Drinks up to 64 oz a day  water, tea and iced tea   Patient have trouble swallowing? Denied  Does the patient cook?   Nos as much  Any kitchen accidents such as leaving the stove on?  Denies  Any headaches?   Denies The double vision? Denies  Any focal numbness or tingling?  Denies  Chronic back pain  Denies  Chronic neck pain :and sees a chiropractor  Unilateral weakness?  Denies. Any tremors?    Any history of anosmia?  Endorsed as above Any incontinence of urine?   Endorsed, stress incontinence, uses a pad . This is worse over the last 3 years  Any bowel dysfunction?    Occasionally has constipation History of heavy alcohol intake?    History of heavy tobacco use?    Family history of dementia?   Mother with vascular Dementia      MRI brain 10/11/21 No evidence of recent infarction, hemorrhage, or mass. No abnormal enhancement.Similar generalized parenchymal volume loss and mild chronic microvascular ischemic changes.   MRI of the brain 04/22/16 (reviewed by me) revealed mild perisylvian and anterior temporal atrophy, mild periventricular and subcortical chronic small vessel ischemic disease, otherwise no acute findings.  Past Medical History:  Diagnosis Date   Arthritis    OA   Blood transfusion without reported diagnosis 7616   Complications c-section   Cataracts, both eyes    Depression    Family history of adverse reaction to anesthesia    mother and sister - slow to awaken   Hyperlipidemia    Hypertension    Insomnia    Memory loss    Right foot pain 10/09/2017   Vision abnormalities      Past Surgical History:  Procedure Laterality Date   BREAST BIOPSY Right    benign   BREAST REDUCTION SURGERY  2007   CESAREAN SECTION     4 c-sections   COLONOSCOPY  2008   COLONOSCOPY W/ POLYPECTOMY  11/15/2011  Dr. Marella Chimes. [facility]. A single semi-pedunculated non-bleeding polyp of benigh apperance was found in the distal ascending colon. A single-piece polypectomy was performed using hot  snare in the distal ascending colon. The polyp was completely removed.   EYE SURGERY Bilateral    catarcts   FRACTURE SURGERY  Oct 2021   Shattered elbow   ORIF ELBOW FRACTURE Left 02/12/2020   Procedure: Left elbow olecranon osteotomy, ulnar nerve release, and surgical reconstruction distal end of the humerus with repair;  Surgeon: Roseanne Kaufman, MD;  Location: [place];  Service: Orthopedics;  Laterality: Left;  2.5 hrs   REDUCTION MAMMAPLASTY Bilateral    ROTATOR CUFF REPAIR Bilateral    right in 2010, left in 2017.   TONSILLECTOMY     TUBAL LIGATION       PREVIOUS MEDICATIONS:   CURRENT MEDICATIONS:  Outpatient Encounter Medications as of 04/18/2022  Medication Sig   Ascorbic Acid (VITAMIN C) 1000 MG tablet Take 1,000 mg by mouth daily.   aspirin EC 81 MG tablet Take 81 mg by mouth daily. Swallow whole.   benzonatate (TESSALON) 100 MG capsule Take 1 capsule (100 mg total) by mouth 2 (two) times daily as needed for cough.   buPROPion (WELLBUTRIN XL) 300 MG 24 hr tablet TAKE 1 TABLET BY MOUTH EVERY DAY   Cholecalciferol (VITAMIN D3) 125 MCG (5000 UT) TABS Take 5,000 Units by mouth daily.   cyanocobalamin 100 MCG tablet Take 100 mcg by mouth daily.   Krill Oil 500 MG CAPS Take 500 mg by mouth daily.   magnesium oxide (MAG-OX) 400 MG tablet Take 400 mg by mouth at bedtime.   meclizine (ANTIVERT) 12.5 MG tablet Take 1 tablet (12.5 mg total) by mouth 3 (three) times daily as needed for dizziness.   Melatonin 10 MG TABS Take 10 mg by mouth at bedtime.   metoprolol succinate (TOPROL-XL) 50 MG 24 hr tablet TAKE 1 TABLET BY MOUTH DAILY. TAKE WITH OR IMMEDIATELY FOLLOWING A MEAL.   niacin 500 MG tablet Take 500 mg by mouth daily.   rosuvastatin (CRESTOR) 10 MG tablet TAKE 1 TABLET BY MOUTH EVERY DAY   sertraline (ZOLOFT) 50 MG tablet TAKE 1 TABLET BY MOUTH EVERY DAY   TURMERIC CURCUMIN PO Take 750 mg by mouth daily.   No facility-administered encounter medications on file as of 04/18/2022.      Objective:     PHYSICAL EXAMINATION:    VITALS:   Vitals:   04/18/22 0916  BP: (!) 151/92  Pulse: 86  Resp: 18  SpO2: 98%  Weight: 169 lb (76.7 kg)  Height: '5\' 4"'$  (1.626 m)    GEN:  The patient appears stated age and is in NAD. HEENT:  Normocephalic, atraumatic.   Neurological examination:  General: NAD, well-groomed, appears stated age. Orientation: The patient is alert. Oriented to person, place and date Cranial nerves: There is good facial symmetry.The speech is fluent and clear. No aphasia or dysarthria. Fund of knowledge is appropriate. Recent memory impaired and remote memory is normal.  Attention and concentration are normal.  Able to name objects and repeat phrases.  Hearing is intact to conversational tone.    Sensation: Sensation is intact to light touch throughout Motor: Strength is at least antigravity x4. DTR's 2/4 in [place]      02/26/2022    2:09 PM 05/13/2017    3:29 PM 04/18/2016   10:59 AM  Montreal Cognitive Assessment   Visuospatial/ Executive (0/5) '3 4 4  '$ Naming (0/3) 3  3 3  Attention: Read list of digits (0/2) '2 2 2  '$ Attention: Read list of letters (0/1) '1 1 1  '$ Attention: Serial 7 subtraction starting at 100 (0/3) '3 3 3  '$ Language: Repeat phrase (0/2) '2 2 2  '$ Language : Fluency (0/1) '1 1 1  '$ Abstraction (0/2) '2 2 2  '$ Delayed Recall (0/5) '4 5 2  '$ Orientation (0/6) '6 6 6  '$ Total '27 29 26  '$ Adjusted Score (based on education)  29 26        No data to display             Movement examination: Tone: There is normal tone in the UE/LE Abnormal movements:  no tremor.  No myoclonus.  No asterixis.   Coordination:  There is no decremation with RAM's. Normal finger to nose  Gait and Station: The patient has no difficulty arising out of a deep-seated chair without the use of the hands. The patient's stride length is good.  Gait is cautious and narrow.   Thank you for allowing [place] the opportunity to participate in the care of this nice patient.  Please do not hesitate to contact us for any questions or concerns.   Total time spent on today's visit was 41 minutes dedicated to this patient today, preparing to see patient, examining the patient, ordering tests and/or medications and counseling the patient, documenting clinical information in the EHR or other health record, independently interpreting results and communicating results to the patient/family, discussing treatment and goals, answering patient's questions and coordinating care.  Cc:  Mosie Lukes, MD  Sharene Butters 04/18/2022 10:38 AM

## 2022-04-18 NOTE — Patient Instructions (Signed)
It was a pleasure to see you today at our office.   Recommendations:  Follow up in 6  months Neuropsych testing for clarity of diagnosis and disease trajectory   Continue PT , and water therapy  Agree with discontinuing gabapentin Continue B12 supplement   Whom to call:  Memory  decline, memory medications: Call our office [phone]   For psychiatric meds, mood meds: Please have your primary care physician manage these medications.   Counseling regarding caregiver distress, including caregiver depression, anxiety and issues regarding community resources, adult day care programs, adult living facilities, or memory care questions:   Feel free to contact [place], Social Worker at [phone]   If you have any severe symptoms of a stroke, or other severe issues such as confusion,severe chills or fever, etc call 911 or go to the ER as you may need to be evaluated further      RECOMMENDATIONS FOR ALL PATIENTS WITH MEMORY PROBLEMS: 1. Continue to exercise (Recommend 30 minutes of walking everyday, or 3 hours every week) 2. Increase social interactions - continue going to [place] and enjoy social gatherings with friends and family 3. Eat healthy, avoid fried foods and eat more fruits and vegetables 4. Maintain adequate blood pressure, blood sugar, and blood cholesterol level. Reducing the risk of stroke and cardiovascular disease also helps promoting better memory. 5. Avoid stressful situations. Live a simple life and avoid aggravations. Organize your time and prepare for the next day in anticipation. 6. Sleep well, avoid any interruptions of sleep and avoid any distractions in the bedroom that may interfere with adequate sleep quality 7. Avoid sugar, avoid sweets as there is a strong link between excessive sugar intake, diabetes, and cognitive impairment We discussed the Mediterranean diet, which has been shown to help patients reduce the risk of progressive memory disorders  and reduces cardiovascular risk. This includes eating fish, eat fruits and green leafy vegetables, nuts like almonds and hazelnuts, walnuts, and also use olive oil. Avoid fast foods and fried foods as much as possible. Avoid sweets and sugar as sugar use has been linked to worsening of memory function.  There is always a concern of gradual progression of memory problems. If this is the case, then we may need to adjust level of care according to patient needs. Support, both to the patient and caregiver, should then be put into place.    FALL PRECAUTIONS: Be cautious when walking. Scan the area for obstacles that may increase the risk of trips and falls. When getting up in the mornings, sit up at the edge of the bed for a few minutes before getting out of bed. Consider elevating the bed at the head end to avoid drop of blood pressure when getting up. Walk always in a well-lit room (use night lights in the walls). Avoid area rugs or power cords from appliances in the middle of the walkways. Use a walker or a cane if necessary and consider physical therapy for balance exercise. Get your eyesight checked regularly.  FINANCIAL OVERSIGHT: Supervision, especially oversight when making financial decisions or transactions is also recommended.  HOME SAFETY: Consider the safety of the kitchen when operating appliances like stoves, microwave oven, and blender. Consider having supervision and share cooking responsibilities until no longer able to participate in those. Accidents with firearms and other hazards in the house should be identified and addressed as well.   ABILITY TO BE LEFT ALONE: If patient is unable to contact 911 operator, consider using LifeLine,  or when the need is there, arrange for someone to stay with patients. Smoking is a fire hazard, consider supervision or cessation. Risk of wandering should be assessed by caregiver and if detected at any point, supervision and safe proof recommendations should  be instituted.  MEDICATION SUPERVISION: Inability to self-administer medication needs to be constantly addressed. Implement a mechanism to ensure safe administration of the medications.

## 2022-04-26 DIAGNOSIS — M25551 Pain in right hip: Secondary | ICD-10-CM | POA: Diagnosis not present

## 2022-04-26 DIAGNOSIS — M79651 Pain in right thigh: Secondary | ICD-10-CM | POA: Diagnosis not present

## 2022-05-20 DIAGNOSIS — M25551 Pain in right hip: Secondary | ICD-10-CM | POA: Diagnosis not present

## 2022-05-20 DIAGNOSIS — M79651 Pain in right thigh: Secondary | ICD-10-CM | POA: Diagnosis not present

## 2022-05-24 ENCOUNTER — Ambulatory Visit (INDEPENDENT_AMBULATORY_CARE_PROVIDER_SITE_OTHER): Payer: PPO | Admitting: *Deleted

## 2022-05-24 DIAGNOSIS — Z0000 Encounter for general adult medical examination without abnormal findings: Secondary | ICD-10-CM

## 2022-05-24 NOTE — Progress Notes (Signed)
I have reviewed and agree with [facility] documentation.  Kathlene November, MD

## 2022-05-24 NOTE — Progress Notes (Signed)
Subjective:   Brittany Salinas is a 72 y.o. female who presents for Medicare Annual (Subsequent) preventive examination.  I connected with  Brittany Salinas on 05/24/22 by a audio enabled telemedicine application and verified that I am speaking with the correct person using two identifiers.  Patient Location: Home  Provider Location: Office/Clinic  I discussed the limitations of evaluation and management by telemedicine. The patient expressed understanding and agreed to proceed.   Review of Systems    Defer to PCP Cardiac Risk Factors include: advanced age (>33mn, >>65women);dyslipidemia;hypertension     Objective:    There were no vitals filed for this visit. There is no height or weight on file to calculate BMI.     05/24/2022    8:25 AM 04/18/2022    9:19 AM 10/17/2021    9:22 AM 09/12/2021    8:10 AM 05/15/2021    9:49 AM 02/12/2020    6:51 AM 01/27/2019    2:47 PM  Advanced Directives  Does Patient Have a Medical Advance Directive? No No Yes Yes No No No  Would patient like information on creating a medical advance directive? No - Patient declined    Yes (MAU/Ambulatory/Procedural Areas - Information given) No - Patient declined Yes (MAU/Ambulatory/Procedural Areas - Information given)    Current Medications (verified) Outpatient Encounter Medications as of 05/24/2022  Medication Sig   Ascorbic Acid (VITAMIN C) 1000 MG tablet Take 1,000 mg by mouth daily.   aspirin EC 81 MG tablet Take 81 mg by mouth daily. Swallow whole.   buPROPion (WELLBUTRIN XL) 300 MG 24 hr tablet TAKE 1 TABLET BY MOUTH EVERY DAY   Cholecalciferol (VITAMIN D3) 125 MCG (5000 UT) TABS Take 5,000 Units by mouth daily.   cyanocobalamin 100 MCG tablet Take 100 mcg by mouth daily.   Krill Oil 500 MG CAPS Take 500 mg by mouth daily.   magnesium oxide (MAG-OX) 400 MG tablet Take 400 mg by mouth at bedtime.   meclizine (ANTIVERT) 12.5 MG tablet Take 1 tablet (12.5 mg total) by mouth 3  (three) times daily as needed for dizziness.   Melatonin 10 MG TABS Take 10 mg by mouth at bedtime.   metoprolol succinate (TOPROL-XL) 50 MG 24 hr tablet TAKE 1 TABLET BY MOUTH DAILY. TAKE WITH OR IMMEDIATELY FOLLOWING A MEAL.   niacin 500 MG tablet Take 500 mg by mouth daily.   rosuvastatin (CRESTOR) 10 MG tablet TAKE 1 TABLET BY MOUTH EVERY DAY   sertraline (ZOLOFT) 50 MG tablet TAKE 1 TABLET BY MOUTH EVERY DAY   TURMERIC CURCUMIN PO Take 750 mg by mouth daily.   [DISCONTINUED] benzonatate (TESSALON) 100 MG capsule Take 1 capsule (100 mg total) by mouth 2 (two) times daily as needed for cough.   No facility-administered encounter medications on file as of 05/24/2022.    Allergies (verified) Codeine   History: Past Medical History:  Diagnosis Date   Arthritis    OA   Blood transfusion without reported diagnosis 17341  Complications c-section   Cataracts, both eyes    Depression    Family history of adverse reaction to anesthesia    mother and sister - slow to awaken   Hyperlipidemia    Hypertension    Insomnia    Memory loss    Right foot pain 10/09/2017   Vision abnormalities    Past Surgical History:  Procedure Laterality Date   BREAST BIOPSY Right    benign   BREAST REDUCTION SURGERY  2007   CESAREAN SECTION     4 c-sections   COLONOSCOPY  2008   COLONOSCOPY W/ POLYPECTOMY  11/15/2011   Dr. Marella Chimes. [facility]. A single semi-pedunculated non-bleeding polyp of benigh apperance was found in the distal ascending colon. A single-piece polypectomy was performed using hot snare in the distal ascending colon. The polyp was completely removed.   EYE SURGERY Bilateral    catarcts   FRACTURE SURGERY  Oct 2021   Shattered elbow   ORIF ELBOW FRACTURE Left 02/12/2020   Procedure: Left elbow olecranon osteotomy, ulnar nerve release, and surgical reconstruction distal end of the humerus with repair;  Surgeon: Roseanne Kaufman, MD;  Location: [facility];  Service: Orthopedics;   Laterality: Left;  2.5 hrs   REDUCTION MAMMAPLASTY Bilateral    ROTATOR CUFF REPAIR Bilateral    right in 2010, left in 2017.   TONSILLECTOMY     TUBAL LIGATION     Family History  Problem Relation Age of Onset   Colon polyps Mother    Heart defect Mother        valvular heart disease   Depression Mother    Heart attack Father    Hyperlipidemia Father    Hypertension Father    Colon polyps Sister    Marfan syndrome Sister    Other Sister        connective tissure disorder   Hypertension Brother    Other Brother        vertigo   Psoriasis Daughter    Mental illness Son        depression alcohol in past   Depression Son    Arrhythmia Son    Alcohol abuse Son        h/o drug abuse   Breast cancer Maternal Aunt    Cancer Maternal Grandmother        stomach   Asthma Maternal Grandfather    Stroke Maternal Grandfather    Marfan syndrome Maternal Grandfather        ?   Alcohol abuse Maternal Grandfather    Colon cancer Neg Hx    Esophageal cancer Neg Hx    Rectal cancer Neg Hx    Stomach cancer Neg Hx    Social History   Socioeconomic History   Marital status: Married    Spouse name: Not on file   Number of children: Not on file   Years of education: 14   Highest education level: Not on file  Occupational History   Not on file  Tobacco Use   Smoking status: Never   Smokeless tobacco: Never  Vaping Use   Vaping Use: Never used  Substance and Sexual Activity   Alcohol use: Yes    Alcohol/week: 2.0 standard drinks of alcohol    Types: 2 Glasses of wine per week   Drug use: No   Sexual activity: Not on file  Other Topics Concern   Not on file  Social History Narrative   Works as Geophysicist/field seismologist, 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 Uses occasional light alcohol   Right handed   2 floor home   Social Determinants of Health   Financial Resource Strain: Low Risk  (05/15/2021)   Overall Financial Resource Strain (CARDIA)     Difficulty of Paying Living Expenses: Not hard at all  Food Insecurity: No Food Insecurity (05/24/2022)   Hunger Vital Sign    Worried About Running Out of Food in the Last Year: Never  true    Ran Out of Food in the Last Year: Never true  Transportation Needs: No Transportation Needs (05/24/2022)   PRAPARE - Hydrologist (Medical): No    Lack of Transportation (Non-Medical): No  Physical Activity: Insufficiently Active (05/15/2021)   Exercise Vital Sign    Days of Exercise per Week: 3 days    Minutes of Exercise per Session: 30 min  Stress: No Stress Concern Present (05/15/2021)   [place]    Feeling of Stress : Not at all  Social Connections: [place] (05/15/2021)   Social Connection and Isolation Panel [NHANES]    Frequency of Communication with Friends and Family: More than three times a week    Frequency of Social Gatherings with Friends and Family: More than three times a week    Attends Religious Services: More than 4 times per year    Active Member of [employer] or Organizations: Yes    Attends Music therapist: More than 4 times per year    Marital Status: Married    Tobacco Counseling Counseling given: Not Answered   Clinical Intake:  Pre-visit preparation completed: Yes  Pain : No/denies pain  Diabetes: No  How often do you need to have someone help you when you read instructions, pamphlets, or other written materials from your doctor or pharmacy?: 1 - Never   Activities of Daily Living    05/24/2022    8:28 AM  In your present state of health, do you have any difficulty performing the following activities:  Hearing? 0  Vision? 0  Difficulty concentrating or making decisions? 1  Comment concentration & some forgetfullness  Walking or climbing stairs? 0  Dressing or bathing? 0  Doing errands, shopping? 0  Preparing Food and eating ? N  Using the  Toilet? N  In the past six months, have you accidently leaked urine? Y  Do you have problems with loss of bowel control? N  Managing your Medications? N  Managing your Finances? N  Housekeeping or managing your Housekeeping? N    Patient Care Team: Mosie Lukes, MD as PCP - General (Family Medicine) Teena Irani, MD (Inactive) as Consulting Physician (Gastroenterology) Jola Schmidt, MD as Consulting Physician (Ophthalmology) Reather Converse, [place] (Inactive) as Referring Physician (Chiropractic Medicine)  Indicate any recent Medical Services you may have received from other than [facility] providers in the past year (date may be approximate).     Assessment:   This is a routine wellness examination for [facility].  Hearing/Vision screen No results found.  Dietary issues and exercise activities discussed: Current Exercise Habits: Home exercise routine, Type of exercise: walking, Time (Minutes): 25, Frequency (Times/Week): 2, Weekly Exercise (Minutes/Week): 50, Intensity: Mild, Exercise limited by: orthopedic condition(s)   Goals Addressed   None    Depression Screen    05/24/2022    8:27 AM 08/30/2021    2:35 PM 05/15/2021    9:56 AM 04/02/2021   11:41 AM 04/13/2018    9:15 AM  PHQ 2/9 Scores  PHQ - 2 Score 0 2 1 0 2  PHQ- 9 Score  2  0     Fall Risk    05/24/2022    8:26 AM 04/18/2022    9:19 AM 10/17/2021    9:22 AM 09/12/2021    8:10 AM 08/30/2021    2:35 PM  Fall Risk   Falls in the past year? '1 1 1 '$ 1  1  Number falls in past yr: 0 0 0 0 0  Injury with Fall? 1 0 0 0 0  Risk for fall due to : History of fall(s)    Impaired balance/gait  Follow up Falls evaluation completed Falls evaluation completed   Falls evaluation completed    [place]:  Any stairs in or around the home? Yes  If so, are there any without handrails? No  Home free of loose throw rugs in walkways, pet beds, electrical cords, etc? Yes  Adequate lighting in  your home to reduce risk of falls? Yes   ASSISTIVE DEVICES UTILIZED TO PREVENT FALLS:  Life alert? No  Use of a cane, walker or w/c? No  Grab bars in the bathroom? No  Shower chair or bench in shower? Yes  Elevated toilet seat or a handicapped toilet? No   TIMED UP AND GO:  Was the test performed?  No, audio visit .    Cognitive Function:      02/26/2022    2:09 PM 05/13/2017    3:29 PM 04/18/2016   10:59 AM  Montreal Cognitive Assessment   Visuospatial/ Executive (0/5) '3 4 4  '$ Naming (0/3) '3 3 3  '$ Attention: Read list of digits (0/2) '2 2 2  '$ Attention: Read list of letters (0/1) '1 1 1  '$ Attention: Serial 7 subtraction starting at 100 (0/3) '3 3 3  '$ Language: Repeat phrase (0/2) '2 2 2  '$ Language : Fluency (0/1) '1 1 1  '$ Abstraction (0/2) '2 2 2  '$ Delayed Recall (0/5) '4 5 2  '$ Orientation (0/6) '6 6 6  '$ Total '27 29 26  '$ Adjusted Score (based on education)  29 26      05/24/2022    8:33 AM  6CIT Screen  What Year? 0 points  What month? 0 points  What time? 0 points  Count back from 20 0 points  Months in reverse 0 points  Repeat phrase 0 points  Total Score 0 points    Immunizations Immunization History  Administered Date(s) Administered   Fluad Quad(high Dose 65+) 01/26/2019   PFIZER Comirnaty(Gray Top)Covid-19 Tri-Sucrose Vaccine 07/30/2020   PFIZER(Purple Top)SARS-COV-2 Vaccination 06/26/2019, 07/19/2019    TDAP status: Due, Education has been provided regarding the importance of this vaccine. Advised may receive this vaccine at local pharmacy or Health Dept. Aware to provide a copy of the vaccination record if obtained from local pharmacy or Health Dept. Verbalized acceptance and understanding.  Flu Vaccine status: Declined, Education has been provided regarding the importance of this vaccine but patient still declined. Advised may receive this vaccine at local pharmacy or Health Dept. Aware to provide a copy of the vaccination record if obtained from local pharmacy or  Health Dept. Verbalized acceptance and understanding.  Pneumococcal vaccine status: Declined,  Education has been provided regarding the importance of this vaccine but patient still declined. Advised may receive this vaccine at local pharmacy or Health Dept. Aware to provide a copy of the vaccination record if obtained from local pharmacy or Health Dept. Verbalized acceptance and understanding.   Covid-19 vaccine status: Declined, Education has been provided regarding the importance of this vaccine but patient still declined. Advised may receive this vaccine at local pharmacy or Health Dept.or vaccine clinic. Aware to provide a copy of the vaccination record if obtained from local pharmacy or Health Dept. Verbalized acceptance and understanding.  Qualifies for Shingles Vaccine? Yes   Zostavax completed No   Shingrix Completed?: No.  Education has been provided regarding the importance of this vaccine. Patient has been advised to call insurance company to determine out of pocket expense if they have not yet received this vaccine. Advised may also receive vaccine at local pharmacy or Health Dept. Verbalized acceptance and understanding.  Screening Tests Health Maintenance  Topic Date Due   DTaP/Tdap/Td (1 - Tdap) Never done   Zoster Vaccines- Shingrix (1 of 2) Never done   Pneumonia Vaccine 43+ Years old (1 - PCV) Never done   INFLUENZA VACCINE  12/04/2021   COVID-19 Vaccine (4 - 2023-24 season) 01/04/2022   Medicare Annual Wellness (AWV)  05/15/2022   MAMMOGRAM  09/05/2023   COLONOSCOPY (Pts 45-68yr Insurance coverage will need to be confirmed)  07/04/2024   DEXA SCAN  Completed   Hepatitis C Screening  Completed   HPV VACCINES  Aged Out    Health Maintenance  Health Maintenance Due  Topic Date Due   DTaP/Tdap/Td (1 - Tdap) Never done   Zoster Vaccines- Shingrix (1 of 2) Never done   Pneumonia Vaccine 71 Years old (1 - PCV) Never done   INFLUENZA VACCINE  12/04/2021   COVID-19  Vaccine (4 - 2023-24 season) 01/04/2022   Medicare Annual Wellness (AWV)  05/15/2022    Colorectal cancer screening: Type of screening: Colonoscopy. Completed 07/04/21. Repeat every 3 years  Mammogram status: Completed 09/04/21. Repeat every year  Bone Density status: Completed 07/19/20. Results reflect: Bone density results: OSTEOPENIA. Repeat every 2 years.  Lung Cancer Screening: (Low Dose CT Chest recommended if Age 71-80years, 30 pack-year currently smoking OR have quit w/in 15years.) does not qualify.   Additional Screening:  Hepatitis C Screening: does qualify; Completed 08/30/21  Vision Screening: Recommended annual ophthalmology exams for early detection of glaucoma and other disorders of the eye. Is the patient up to date with their annual eye exam?  Yes  Who is the provider or what is the name of the office in which the patient attends annual eye exams? [facility] If pt is not established with a provider, would they like to be referred to a provider to establish care? No .   Dental Screening: Recommended annual dental exams for proper oral hygiene  Community Resource Referral / Chronic Care Management: CRR required this visit?  No   CCM required this visit?  No      Plan:     I have personally reviewed and noted the following in the patient's chart:   Medical and social history Use of alcohol, tobacco or illicit drugs  Current medications and supplements including opioid prescriptions. Patient is not currently taking opioid prescriptions. Functional ability and status Nutritional status Physical activity Advanced directives List of other physicians Hospitalizations, surgeries, and ER visits in previous 12 months Vitals Screenings to include cognitive, depression, and falls Referrals and appointments  In addition, I have reviewed and discussed with patient certain preventive protocols, quality metrics, and best practice  recommendations. A written personalized care plan for preventive services as well as general preventive health recommendations were provided to patient.   Due to this being a telephonic visit, the after visit summary with patients personalized plan was offered to patient via mail or my-chart. Patient would like to access on my-chart.  BBeatris Ship [place]  05/24/2022   Nurse Notes: None

## 2022-05-24 NOTE — Patient Instructions (Signed)
Brittany Salinas , Thank you for taking time to come for your Medicare Wellness Visit. I appreciate your ongoing commitment to your health goals. Please review the following plan we discussed and let me know if I can assist you in the future.   These are the goals we discussed:  Goals      Patient Stated     Drink more water, increase activity & volunteer at the [employer].        This is a list of the screening recommended for you and due dates:  Health Maintenance  Topic Date Due   DTaP/Tdap/Td vaccine (1 - Tdap) Never done   Flu Shot  08/04/2022*   Zoster (Shingles) Vaccine (1 of 2) 05/06/2023*   Pneumonia Vaccine (1 - PCV) 05/25/2023*   COVID-19 Vaccine (4 - 2023-24 season) 05/24/2025*   Medicare Annual Wellness Visit  05/25/2023   Mammogram  09/05/2023   Colon Cancer Screening  07/04/2024   DEXA scan (bone density measurement)  Completed   Hepatitis C Screening: USPSTF Recommendation to screen - Ages 6-79 yo.  Completed   HPV Vaccine  Aged Out  *Topic was postponed. The date shown is not the original due date.     Next appointment: Follow up in one year for your annual wellness visit.   Preventive Care 65 Years and Older, Female Preventive care refers to lifestyle choices and visits with your health care provider that can promote health and wellness. What does preventive care include? A yearly physical exam. This is also called an annual well check. Dental exams once or twice a year. Routine eye exams. Ask your health care provider how often you should have your eyes checked. Personal lifestyle choices, including: Daily care of your teeth and gums. Regular physical activity. Eating a healthy diet. Avoiding tobacco and drug use. Limiting alcohol use. Practicing safe sex. Taking low-dose aspirin every day. Taking vitamin and mineral supplements as recommended by your health care provider. What happens during an annual well check? The services and screenings done by  your health care provider during your annual well check will depend on your age, overall health, lifestyle risk factors, and family history of disease. Counseling  Your health care provider may ask you questions about your: Alcohol use. Tobacco use. Drug use. Emotional well-being. Home and relationship well-being. Sexual activity. Eating habits. History of falls. Memory and ability to understand (cognition). Work and work Statistician. Reproductive health. Screening  You may have the following tests or measurements: Height, weight, and BMI. Blood pressure. Lipid and cholesterol levels. These may be checked every 5 years, or more frequently if you are over 76 years old. Skin check. Lung cancer screening. You may have this screening every year starting at age 54 if you have a 30-pack-year history of smoking and currently smoke or have quit within the past 15 years. Fecal occult blood test (FOBT) of the stool. You may have this test every year starting at age 33. Flexible sigmoidoscopy or colonoscopy. You may have a sigmoidoscopy every 5 years or a colonoscopy every 10 years starting at age 71. Hepatitis C blood test. Hepatitis B blood test. Sexually transmitted disease (STD) testing. Diabetes screening. This is done by checking your blood sugar (glucose) after you have not eaten for a while (fasting). You may have this done every 1-3 years. Bone density scan. This is done to screen for osteoporosis. You may have this done starting at age 26. Mammogram. This may be done every 1-2 years. Talk  to your health care provider about how often you should have regular mammograms. Talk with your health care provider about your test results, treatment options, and if necessary, the need for more tests. Vaccines  Your health care provider may recommend certain vaccines, such as: Influenza vaccine. This is recommended every year. Tetanus, diphtheria, and acellular pertussis (Tdap, Td) vaccine. You  may need a Td booster every 10 years. Zoster vaccine. You may need this after age 35. Pneumococcal 13-valent conjugate (PCV13) vaccine. One dose is recommended after age 18. Pneumococcal polysaccharide (PPSV23) vaccine. One dose is recommended after age 71. Talk to your health care provider about which screenings and vaccines you need and how often you need them. This information is not intended to replace advice given to you by your health care provider. Make sure you discuss any questions you have with your health care provider. Document Released: 05/19/2015 Document Revised: 01/10/2016 Document Reviewed: 02/21/2015 Elsevier Interactive Patient Education  2017 [employer] Prevention in the Home Falls can cause injuries. They can happen to people of all ages. There are many things you can do to make your home safe and to help prevent falls. What can I do on the outside of my home? Regularly fix the edges of walkways and driveways and fix any cracks. Remove anything that might make you trip as you walk through a door, such as a raised step or threshold. Trim any bushes or trees on the path to your home. Use bright outdoor lighting. Clear any walking paths of anything that might make someone trip, such as rocks or tools. Regularly check to see if handrails are loose or broken. Make sure that both sides of any steps have handrails. Any raised decks and porches should have guardrails on the edges. Have any leaves, snow, or ice cleared regularly. Use sand or salt on walking paths during winter. Clean up any spills in your garage right away. This includes oil or grease spills. What can I do in the bathroom? Use night lights. Install grab bars by the toilet and in the tub and shower. Do not use towel bars as grab bars. Use non-skid mats or decals in the tub or shower. If you need to sit down in the shower, use a plastic, non-slip stool. Keep the floor dry. Clean up any water that spills  on the floor as soon as it happens. Remove soap buildup in the tub or shower regularly. Attach bath mats securely with double-sided non-slip rug tape. Do not have throw rugs and other things on the floor that can make you trip. What can I do in the bedroom? Use night lights. Make sure that you have a light by your bed that is easy to reach. Do not use any sheets or blankets that are too big for your bed. They should not hang down onto the floor. Have a firm chair that has side arms. You can use this for support while you get dressed. Do not have throw rugs and other things on the floor that can make you trip. What can I do in the kitchen? Clean up any spills right away. Avoid walking on wet floors. Keep items that you use a lot in easy-to-reach places. If you need to reach something above you, use a strong step stool that has a grab bar. Keep electrical cords out of the way. Do not use floor polish or wax that makes floors slippery. If you must use wax, use non-skid floor wax. Do  not have throw rugs and other things on the floor that can make you trip. What can I do with my stairs? Do not leave any items on the stairs. Make sure that there are handrails on both sides of the stairs and use them. Fix handrails that are broken or loose. Make sure that handrails are as long as the stairways. Check any carpeting to make sure that it is firmly attached to the stairs. Fix any carpet that is loose or worn. Avoid having throw rugs at the top or bottom of the stairs. If you do have throw rugs, attach them to the floor with carpet tape. Make sure that you have a light switch at the top of the stairs and the bottom of the stairs. If you do not have them, ask someone to add them for you. What else can I do to help prevent falls? Wear shoes that: Do not have high heels. Have rubber bottoms. Are comfortable and fit you well. Are closed at the toe. Do not wear sandals. If you use a stepladder: Make  sure that it is fully opened. Do not climb a closed stepladder. Make sure that both sides of the stepladder are locked into place. Ask someone to hold it for you, if possible. Clearly mark and make sure that you can see: Any grab bars or handrails. First and last steps. Where the edge of each step is. Use tools that help you move around (mobility aids) if they are needed. These include: Canes. Walkers. Scooters. Crutches. Turn on the lights when you go into a dark area. Replace any light bulbs as soon as they burn out. Set up your furniture so you have a clear path. Avoid moving your furniture around. If any of your floors are uneven, fix them. If there are any pets around you, be aware of where they are. Review your medicines with your doctor. Some medicines can make you feel dizzy. This can increase your chance of falling. Ask your doctor what other things that you can do to help prevent falls. This information is not intended to replace advice given to you by your health care provider. Make sure you discuss any questions you have with your health care provider. Document Released: 02/16/2009 Document Revised: 09/28/2015 Document Reviewed: 05/27/2014 Elsevier Interactive Patient Education  2017 [employer].

## 2022-05-28 ENCOUNTER — Other Ambulatory Visit: Payer: Self-pay | Admitting: Family Medicine

## 2022-06-05 DIAGNOSIS — N390 Urinary tract infection, site not specified: Secondary | ICD-10-CM | POA: Diagnosis not present

## 2022-06-13 ENCOUNTER — Encounter: Payer: PPO | Admitting: Psychology

## 2022-06-24 ENCOUNTER — Encounter: Payer: PPO | Admitting: Psychology

## 2022-06-26 ENCOUNTER — Other Ambulatory Visit: Payer: Self-pay | Admitting: Family Medicine

## 2022-09-17 ENCOUNTER — Encounter: Payer: Self-pay | Admitting: Psychology

## 2022-09-17 DIAGNOSIS — F411 Generalized anxiety disorder: Secondary | ICD-10-CM | POA: Insufficient documentation

## 2022-09-17 DIAGNOSIS — M1990 Unspecified osteoarthritis, unspecified site: Secondary | ICD-10-CM | POA: Insufficient documentation

## 2022-09-17 HISTORY — DX: Generalized anxiety disorder: F41.1

## 2022-09-18 ENCOUNTER — Encounter: Payer: Self-pay | Admitting: Psychology

## 2022-09-18 ENCOUNTER — Ambulatory Visit: Payer: PPO

## 2022-09-18 ENCOUNTER — Ambulatory Visit: Payer: PPO | Admitting: Psychology

## 2022-09-18 DIAGNOSIS — F411 Generalized anxiety disorder: Secondary | ICD-10-CM | POA: Diagnosis not present

## 2022-09-18 DIAGNOSIS — R4189 Other symptoms and signs involving cognitive functions and awareness: Secondary | ICD-10-CM | POA: Diagnosis not present

## 2022-09-18 DIAGNOSIS — F330 Major depressive disorder, recurrent, mild: Secondary | ICD-10-CM | POA: Diagnosis not present

## 2022-09-18 NOTE — Progress Notes (Signed)
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 Average    Auditory Comprehension 56 73 Average    Reading Comprehension Raw = 13/13 --- Within Normal Limits    Naming 31/31 (58) 79 Above Average    Writing 52 58 Average       Visuospatial/Visuoconstruction:      Raw Score Percentile   Clock Drawing: 10/10 --- Within Normal Limits       NAB Spatial Module, Form 1: T Score Percentile     Figure Drawing Copy 46 34 Average        Scaled Score Percentile   WAIS-IV Block Design: 11 63 Average       Mood and Personality:      Raw Score Percentile   Beck Depression Inventory - II: 6 --- Within Normal Limits  PROMIS Anxiety Questionnaire: 16 --- Mild       Additional Questionnaires:      Raw Score Percentile   PROMIS Sleep Disturbance Questionnaire: 11 --- None to Slight   Informed  Consent and Coding/Compliance:   The current evaluation represents a clinical evaluation for the purposes previously outlined by the referral source and is in no way reflective of a forensic evaluation.   Ms. Gohlke was provided with a verbal description of the nature and  purpose of the present neuropsychological evaluation. Also reviewed were the foreseeable risks and/or discomforts and benefits of the procedure, limits of confidentiality, and mandatory reporting requirements of this provider. The patient was given the opportunity to ask questions and receive answers about the evaluation. Oral consent to participate was provided by the patient.   This evaluation was conducted by Newman Nickels, Ph.D., ABPP-CN, board certified clinical neuropsychologist. Ms. Rold completed a clinical interview with Dr. Milbert Coulter, billed as one unit [phone], and 150 minutes of cognitive testing and scoring, billed as one unit [phone] and four additional units [id number]. Psychometrist Shan Levans, B.S., assisted Dr. Milbert Coulter with test administration and scoring procedures. As a separate and discrete service, one unit [id number] and two units [phone] were billed for Dr. Tammy Sours time spent in interpretation and report writing.

## 2022-09-18 NOTE — Progress Notes (Signed)
   Psychometrician Note   Cognitive testing was administered to Brittany Salinas by Brittany Salinas, B.S. (psychometrist) under the supervision of Brittany Salinas, Ph.D., licensed psychologist on 09/18/2022. Brittany Salinas did not appear overtly distressed by the testing session per behavioral observation or responses across self-report questionnaires. Rest breaks were offered.    The battery of tests administered was selected by Brittany Salinas, Ph.D. with consideration to Brittany Salinas's current level of functioning, the nature of her symptoms, emotional and behavioral responses during interview, level of literacy, observed level of motivation/effort, and the nature of the referral question. This battery was communicated to the psychometrist. Communication between Brittany Salinas, Ph.D. and the psychometrist was ongoing throughout the evaluation and Brittany Salinas, Ph.D. was immediately accessible at all times. Brittany Salinas, Ph.D. provided supervision to the psychometrist on the date of this service to the extent necessary to assure the quality of all services provided.    Brittany Salinas will return within approximately 1-2 weeks for an interactive feedback session with Brittany Salinas at which time her test performances, clinical impressions, and treatment recommendations will be reviewed in detail. Brittany Salinas understands she can contact our office should she require our assistance before this time.  A total of 150 minutes of billable time were spent face-to-face with Brittany Salinas by the psychometrist. This includes both test administration and scoring time. Billing for these services is reflected in the clinical report generated by Brittany Salinas, Ph.D.  This note reflects time spent with the psychometrician and does not include test scores or any clinical interpretations made by Brittany Salinas. The full report will follow in a separate note.

## 2022-09-25 ENCOUNTER — Ambulatory Visit: Payer: PPO | Admitting: Psychology

## 2022-09-25 DIAGNOSIS — F411 Generalized anxiety disorder: Secondary | ICD-10-CM

## 2022-09-25 DIAGNOSIS — R4189 Other symptoms and signs involving cognitive functions and awareness: Secondary | ICD-10-CM | POA: Diagnosis not present

## 2022-09-25 DIAGNOSIS — F330 Major depressive disorder, recurrent, mild: Secondary | ICD-10-CM

## 2022-09-25 NOTE — Progress Notes (Signed)
   Neuropsychology Feedback Session Eligha Bridegroom. [facility] Department of Neurology  Reason for Referral:   Brittany Salinas is a 72 y.o. right-handed Caucasian female referred by Marlowe Kays, PA-C, to characterize her current cognitive functioning and assist with diagnostic clarity and treatment planning in the context of subjective cognitive decline.   Feedback:   Ms. Degon completed a comprehensive neuropsychological evaluation on 09/18/2022. Please refer to that encounter for the full report and recommendations. Briefly, results suggested neuropsychological functioning largely within normal limits relative to age-matched peers. A relative weakness was exhibited across phonemic fluency. Some variability was also exhibited across cognitive flexibility; however, nearly all executive functioning tasks scored appropriately. Performances were appropriate relative to age-matched peers across all other domains. Ms. Scerbo subjective experience may simply reflect normal age-related changes, made worse by psychiatric distress and stress (especially mental preoccupation surrounding subjective memory dysfunction). Current memory testing was strong and certainly not suggestive of symptomatic Alzheimer's disease presently. She also does not display any cognitive or behavioral patterns for other more common neurodegenerative illnesses at the current time.   Ms. Valentino was accompanied by her husband during the current feedback session. Content of the current session focused on the results of her neuropsychological evaluation. Ms. Chapell was given the opportunity to ask questions and her questions were answered. She was encouraged to reach out should additional questions arise. A copy of her report was provided at the conclusion of the visit.      One unit [phone] was billed for Dr. Tammy Sours time spent preparing for, conducting, and documenting the current feedback session with Ms.  Dacy.

## 2022-10-08 ENCOUNTER — Ambulatory Visit (INDEPENDENT_AMBULATORY_CARE_PROVIDER_SITE_OTHER): Payer: PPO | Admitting: Family Medicine

## 2022-10-08 ENCOUNTER — Ambulatory Visit (HOSPITAL_BASED_OUTPATIENT_CLINIC_OR_DEPARTMENT_OTHER)
Admission: RE | Admit: 2022-10-08 | Discharge: 2022-10-08 | Disposition: A | Discharge status: Home / Self Care | Payer: PPO | Source: Ambulatory Visit | Attending: Family Medicine | Admitting: Family Medicine

## 2022-10-08 ENCOUNTER — Encounter: Payer: Self-pay | Admitting: Family Medicine

## 2022-10-08 VITALS — BP 125/71 | HR 72 | Ht 64.0 in | Wt 162.0 lb

## 2022-10-08 DIAGNOSIS — Z1231 Encounter for screening mammogram for malignant neoplasm of breast: Secondary | ICD-10-CM

## 2022-10-08 DIAGNOSIS — R053 Chronic cough: Secondary | ICD-10-CM

## 2022-10-08 DIAGNOSIS — R1310 Dysphagia, unspecified: Secondary | ICD-10-CM | POA: Diagnosis not present

## 2022-10-08 DIAGNOSIS — R09A2 Foreign body sensation, throat: Secondary | ICD-10-CM | POA: Diagnosis not present

## 2022-10-08 DIAGNOSIS — Z0389 Encounter for observation for other suspected diseases and conditions ruled out: Secondary | ICD-10-CM | POA: Diagnosis not present

## 2022-10-08 MED ORDER — AZELASTINE-FLUTICASONE 137-50 MCG/ACT NA SUSP: 1.0000 | Freq: Two times a day (BID) | NASAL | 3 refills | Status: DC

## 2022-10-08 MED ORDER — PANTOPRAZOLE SODIUM 40 MG PO TBEC: 40.0000 mg | DELAYED_RELEASE_TABLET | Freq: Every day | ORAL | 3 refills | Status: DC

## 2022-10-08 MED ORDER — BENZONATATE 100 MG PO CAPS: 100.0000 mg | ORAL_CAPSULE | Freq: Three times a day (TID) | ORAL | 0 refills | Status: DC | PRN

## 2022-10-08 MED ORDER — CETIRIZINE HCL 10 MG PO TABS: 10.0000 mg | ORAL_TABLET | Freq: Every day | ORAL | 11 refills | Status: DC

## 2022-10-08 NOTE — Patient Instructions (Addendum)
Possible chronic/cyclical cough that is aggravated by reflux, coughing, and drainage.  Goal is to not cough or clear your throat.  Avoid coughing or clearing throat by using non-mint. products/sugarless candy, water, ice chips. Remember NO MINT PRODUCTS Mucinex DM 1-2 every 12 hrs or Delsym 2 tsp every 12 hrs for cough Tessalon Three times a day as needed for cough.  Protonix every morning - for potential reflux Zyrtec at bedtime and Dymista nasal spray twice daily - for drainage Chest xray and Pulmonology referral given duration of cough GI referral for throat symptoms, could also consider ENT workup

## 2022-10-08 NOTE — Progress Notes (Signed)
Acute Office Visit  Subjective:     Patient ID: Brittany Salinas, female    DOB: 1951/01/25, 72 y.o.   MRN: [id number]  Chief Complaint  Patient presents with   Cough   Sore Throat     Patient is in today for cough and throat irritation.    Discussed the use of AI scribe software for clinical note transcription with the patient, who gave verbal consent to proceed.  History of Present Illness   The patient, with a history of COVID-19 infection a year and a half ago, presents with persistent throat discomfort and a cough. They report a change in their voice and a sensation of throat tightness, particularly when swallowing, although they deny difficulty swallowing - she does mention sometimes large bites of food will feel like they get stuck, or she will feel like she has a ball of mucus stuck in her throat. Certain foods, particularly dry or crispy items, trigger the cough. They also report a persistent tickle in their throat, which often initiates a coughing fit. The patient consumes a large number of cough drops daily to manage these symptoms.  The patient also reports a loss of taste and smell, particularly on the right side, which began during their COVID-19 infection and has not fully returned. They also experience constant tearing and nasal drainage on the right side when coughing.  The patient denies any history of smoking, fever, chills, body aches, heartburn, reflux, or shortness of breath. They report that these symptoms occur daily and have been consistent since their COVID-19 infection.          All review of systems negative except what is listed in the HPI      Objective:    BP 125/71   Pulse 72   Ht 5\' 4"  (1.626 m)   Wt 162 lb (73.5 kg)   SpO2 100%   BMI 27.81 kg/m    Physical Exam Vitals reviewed.  Constitutional:      Appearance: Normal appearance. She is well-developed.  HENT:     Head: Normocephalic and atraumatic.     Mouth/Throat:      Mouth: Mucous membranes are moist.     Pharynx: Oropharynx is clear. No posterior oropharyngeal erythema.  Eyes:     Conjunctiva/sclera: Conjunctivae normal.  Cardiovascular:     Rate and Rhythm: Normal rate and regular rhythm.     Pulses: Normal pulses.     Heart sounds: Normal heart sounds.  Pulmonary:     Effort: Pulmonary effort is normal.     Breath sounds: Normal breath sounds. No wheezing, rhonchi or rales.  Abdominal:     Palpations: Abdomen is soft.     Tenderness: There is no abdominal tenderness.  Musculoskeletal:     Cervical back: Normal range of motion and neck supple.  Lymphadenopathy:     Cervical: No cervical adenopathy.  Skin:    General: Skin is warm and dry.  Neurological:     Mental Status: She is alert and oriented to person, place, and time.  Psychiatric:        Mood and Affect: Mood normal.        Behavior: Behavior normal.        Thought Content: Thought content normal.        Judgment: Judgment normal.     No results found for any visits on 10/08/22.      Assessment & Plan:   Problem List Items Addressed This Visit  None Visit Diagnoses     Chronic cough    -  Primary   Relevant Medications   benzonatate (TESSALON) 100 MG capsule   cetirizine (ZYRTEC) 10 MG tablet   pantoprazole (PROTONIX) 40 MG tablet   Azelastine-Fluticasone 137-50 MCG/ACT SUSP   Other Relevant Orders   DG Chest 2 View   Ambulatory referral to Pulmonology   Globus sensation       Relevant Medications   pantoprazole (PROTONIX) 40 MG tablet   Other Relevant Orders   Ambulatory referral to Gastroenterology   Dysphagia, unspecified type       Relevant Orders   Ambulatory referral to Gastroenterology   Encounter for screening mammogram for malignant neoplasm of breast       Relevant Orders   MM 3D SCREENING MAMMOGRAM BILATERAL BREAST     Possible chronic/cyclical cough that is aggravated by reflux, coughing, and drainage.  Goal is to not cough or clear your  throat.  Avoid coughing or clearing throat by using non-mint. products/sugarless candy, water, ice chips. Remember NO MINT PRODUCTS Mucinex DM 1-2 every 12 hrs or Delsym 2 tsp every 12 hrs for cough Tessalon Three times a day as needed for cough.  Protonix every morning - for potential reflux Zyrtec at bedtime and Dymista nasal spray twice daily - for drainage Chest xray and Pulmonology referral given duration of cough GI referral for throat symptoms, could also consider ENT workup     Meds ordered this encounter  Medications   benzonatate (TESSALON) 100 MG capsule    Sig: Take 1 capsule (100 mg total) by mouth 3 (three) times daily as needed for cough.    Dispense:  30 capsule    Refill:  0    Order Specific Question:   Supervising Provider    Answer:   Danise Edge A [[id number]]   cetirizine (ZYRTEC) 10 MG tablet    Sig: Take 1 tablet (10 mg total) by mouth daily.    Dispense:  30 tablet    Refill:  11    Order Specific Question:   Supervising Provider    Answer:   Danise Edge A [[id number]]   pantoprazole (PROTONIX) 40 MG tablet    Sig: Take 1 tablet (40 mg total) by mouth daily.    Dispense:  30 tablet    Refill:  3    Order Specific Question:   Supervising Provider    Answer:   Danise Edge A [[id number]]   Azelastine-Fluticasone 137-50 MCG/ACT SUSP    Sig: Place 1 spray into the nose every 12 (twelve) hours.    Dispense:  23 g    Refill:  3    Order Specific Question:   Supervising Provider    Answer:   Danise Edge A [[id number]]    Return if symptoms worsen or fail to improve.  Clayborne Dana, NP

## 2022-10-11 ENCOUNTER — Encounter: Payer: Self-pay | Admitting: Physician Assistant

## 2022-10-11 ENCOUNTER — Ambulatory Visit (INDEPENDENT_AMBULATORY_CARE_PROVIDER_SITE_OTHER): Payer: PPO | Admitting: Physician Assistant

## 2022-10-11 VITALS — BP 141/84 | HR 61 | Resp 18 | Ht 64.0 in | Wt 162.0 lb

## 2022-10-11 DIAGNOSIS — R4189 Other symptoms and signs involving cognitive functions and awareness: Secondary | ICD-10-CM

## 2022-10-11 NOTE — Progress Notes (Signed)
Assessment/Plan:   Subjective Memory Complaints  Brittany Salinas is a very pleasant 72 y.o. RH female with a history of hypertension, hyperlipidemia, depression, osteoarthritis, history of colon polyps s/p resection of these polyps March 2023 presenting today in follow-up for evaluation of memory difficulties.  Most recent neuropsychological evaluation 09/18/2022  yielded normal results relative to age-matched peers.  At this time, her testing was not suggestive for Alzheimer's disease at this time or other neurodegenerative illnesses..  No indication for antidementia medication given the results.  She continues to participate in her ADLs without any issues.   Recommendations:   Follow up as needed  no indication for antidementia medication Continue B12 supplements Recommend good control of cardiovascular risk factors Continue to control mood as per PCP    Subjective:   This patient is accompanied in the office by her husband   who supplements the history. Previous records as well as any outside records available were reviewed prior to todays visit.   Patient was last seen on 04/18/22.   Any changes in memory since last visit?  "Not as bad".  She tries to stay active. Enjoys crossword puzzles, word finding, and reading.  She states that if she moves around, the memory improves.  She enjoys walking outside and enjoying the sun. She meets with a group of people once weekly repeats oneself?  Occasionally Disoriented when walking into a room?  Patient denies except occasionally not remembering what patient came to the room for    Leaving objects in unusual places?  Patient denies   Wandering behavior?   denies   Any personality changes since last visit?   denies   Any worsening depression?: denies   Hallucinations or paranoia?  denies   Seizures?   denies    Any sleep changes?  Sleeps well.  Denies  vivid dreams, REM behavior or sleepwalking   Sleep apnea?   denies   Any  hygiene concerns?   denies   Independent of bathing and dressing?  Endorsed  Does the patient needs help with medications?  Patient is in charge   Who is in charge of the finances?  Both her husband and her are in charge     Any changes in appetite?  denies     Patient have trouble swallowing?  denies   Does the patient cook?  Any kitchen accidents such as leaving the stove on?   denies   Any headaches?    denies   Vision changes? denies Chronic back pain endorsed, she has periodic epidural injections Ambulates with difficulty?    denies    Recent falls or head injuries?    denies     Unilateral weakness, numbness or tingling?   denies   Any tremors?  denies   Any anosmia?    denies   Any incontinence of urine?  Stress incontinence, uses a pad Any bowel dysfunction?  denies      Patient lives with her husband  Does the patient drive? Short distances    Neuropsychological Evaluation 09/18/2022 Briefly, results suggested neuropsychological functioning largely within normal limits relative to age-matched peers. A relative weakness was exhibited across phonemic fluency. Some variability was also exhibited across cognitive flexibility; however, nearly all executive functioning tasks scored appropriately. Performances were appropriate relative to age-matched peers across all other domains. Brittany Salinas subjective experience may simply reflect normal age-related changes, made worse by psychiatric distress and stress (especially mental preoccupation surrounding subjective memory dysfunction). Current memory testing was  strong and certainly not suggestive of symptomatic Alzheimer's disease presently. She also does not display any cognitive or behavioral patterns for other more common neurodegenerative illnesses at the current time.     Initial evaluation 09/12/2021 How long did patient have memory difficulties?  Since 2015.  She had previously been evaluated in December 2017 at [facility] when she was noting  difficulty remembering recent events, had trouble planning and executing a plan.  Patient was working as a Teacher, adult education, and could not remember the names of the muscles or insertions and she needed a cue.  She is a good reader, and could not recall the titles of the authors.  MoCA at that time was 26/30, with delayed recall 2/5.  Aricept was recommended, but the patient preferred to hold off.   As her MoCA improved to 29/30 as of January 2019, it was felt that her memory issues were likely worsen by depression and insomnia then to a degenerative process such as Alzheimer's disease.  She tried to stay active, eat clean, get regular exercise, and sleep regularly, but her cognitive issues mentioned above remained present.  During a well exam on 08/30/2021, the patient reported worsening short-term memory, and does not suspect that this is due to depression "because it is under control'.       Today she reports memory is worse, trying to keep up with dates on the calendar and appointments, trying to not to plan too much ahead.  "I missed several         birthdays ". She also notices that for the last year her speech is not as fluent as before , "I could put a half a sentence and have to figure the rest of the        sentence.  " I am thinking of a word and another comes out, so I stopped participating on discussions". However, if she reads aloud "it is so much better than         reading in silence". She also does sodoku and crossword puzzles. Patient lives with: Spouse who notices, he is more aware of these changes.  repeats oneself?  Endorsed, not very frequently except when trying to remember appointments she mar ask several times, for example "what is happening at [facility] this Sunday?" Disoriented when walking into a room?   Denies  Leaving objects in unusual places?   Denies  Ambulates  with difficulty?   Patient has difficulty with gait dating back to 2015, when she reported feeling "a little off  balance ".  However, there are days that she has difficulty picking her feet up while walking, unable to walk a straight line, and has to be extra careful to avoid falls.  "  I have bunions, bones moved in both feet and became painful and have caused some trouble walking"  Recent falls?  Endorsed.  She had a recent fall 1 month ago on an unsteady floor. She has had physical therapy for loss of balance before (2018), which was helpful  Any head injuries?   Denies  History of seizures?     Denies  Wandering behavior?    Denies  Patient drives?  Endorsed, prefers to drive short distances, but with GPS" is not nerve wracking as before " Any mood changes such irritability agitation?   Denies    Any history of depression?:  Endorsed.  The patient takes Zoloft and Wellbutrin, well controlled. Hallucinations?  Denies  Paranoia?   Denies  Patient sleeps  between 6 and 8 hours, denies vivid dreams or REM behavior.  I wake up by hearing something and he says I didn't hear anything, I heard a squirrel on the room, and the voice of her children for the last year. She is not sure if she is dreaming or if it  hallucination.  She uses 5 mg melatonin and magnesium to help her sleep. History of sleep apnea?  Denies Any hygiene concerns?    Denies  Independent of bathing and dressing?  Endorsed  Does the patient needs help with medications?  Denies. Uses a pillbox   Who is in charge of the finances? More and more I don't feel confident about doing the finances.    Any changes in appetite?  Endorsed, but started with Covid around Nov 2022, with decreased sense of smell and taste "so food doesn't taste good".  Drinks up to 64 oz a day water, tea and iced tea   Patient have trouble swallowing? Denied  Does the patient cook?   Nos as much  Any kitchen accidents such as leaving the stove on?  Denies  Any headaches?   Denies The double vision? Denies  Any focal numbness or tingling?  Denies  Chronic back pain  Denies   Chronic neck pain :and sees a chiropractor  Unilateral weakness?  Denies. Any tremors?    Any history of anosmia?  Endorsed as above Any incontinence of urine?   Endorsed, stress incontinence, uses a pad . This is worse over the last 3 years  Any bowel dysfunction?    Occasionally has constipation History of heavy alcohol intake?    History of heavy tobacco use?    Family history of dementia?   Mother with vascular Dementia       MRI brain 10/11/21 No evidence of recent infarction, hemorrhage, or mass. No abnormal enhancement.Similar generalized parenchymal volume loss and mild chronic microvascular ischemic changes.   MRI of the brain 04/22/16 (reviewed by me) revealed mild perisylvian and anterior temporal atrophy, mild periventricular and subcortical chronic small vessel ischemic disease, otherwise no acute findings.  Past Medical History:  Diagnosis Date   Blood transfusion without reported diagnosis 1997   Complications c-section   Cataracts, both eyes    Closed fracture of fifth metatarsal bone 12/14/2021   Closed fracture of fourth metatarsal bone 12/14/2021   Colon polyp 10/09/2017   Dyspareunia in female 11/16/2018   Family history of adverse reaction to anesthesia    mother and sister - slow to awaken   Gait disturbance 04/18/2016   Generalized anxiety disorder 09/17/2022   History of COVID-19 03/16/2021   Hyperlipidemia    Hypertension    Insomnia    Left elbow fracture 02/12/2020   Left knee pain 05/13/2017   Lumbar radiculopathy 01/22/2022   Lumbar spondylosis 03/05/2022   Major depressive disorder 04/18/2016   Obesity 04/13/2018   Osteoarthritis    Pain in joint of right hip 01/04/2022   Right foot pain 10/09/2017   Subjective memory complaints 04/18/2016   Vaginitis 04/13/2018   Vertigo 01/29/2019   Vision abnormalities      Past Surgical History:  Procedure Laterality Date   BREAST BIOPSY Right    benign   BREAST REDUCTION SURGERY  2007   CESAREAN  SECTION     4 c-sections   COLONOSCOPY  2008   COLONOSCOPY W/ POLYPECTOMY  11/15/2011   Dr. Magda Kiel. [facility]. A single semi-pedunculated non-bleeding polyp of benigh apperance was found in the distal  ascending colon. A single-piece polypectomy was performed using hot snare in the distal ascending colon. The polyp was completely removed.   EYE SURGERY Bilateral    catarcts   FRACTURE SURGERY  Oct 2021   Shattered elbow   ORIF ELBOW FRACTURE Left 02/12/2020   Procedure: Left elbow olecranon osteotomy, ulnar nerve release, and surgical reconstruction distal end of the humerus with repair;  Surgeon: Dominica Severin, MD;  Location: [facility] OR;  Service: Orthopedics;  Laterality: Left;  2.5 hrs   REDUCTION MAMMAPLASTY Bilateral    ROTATOR CUFF REPAIR Bilateral    right in 2010, left in 2017.   TONSILLECTOMY     TUBAL LIGATION       PREVIOUS MEDICATIONS:   CURRENT MEDICATIONS:  Outpatient Encounter Medications as of 10/11/2022  Medication Sig   Ascorbic Acid (VITAMIN C) 1000 MG tablet Take 1,000 mg by mouth daily.   aspirin EC 81 MG tablet Take 81 mg by mouth daily. Swallow whole.   Azelastine-Fluticasone 137-50 MCG/ACT SUSP Place 1 spray into the nose every 12 (twelve) hours.   b complex vitamins capsule Take 1 capsule by mouth daily.   benzonatate (TESSALON) 100 MG capsule Take 1 capsule (100 mg total) by mouth 3 (three) times daily as needed for cough.   buPROPion (WELLBUTRIN XL) 300 MG 24 hr tablet TAKE 1 TABLET BY MOUTH EVERY DAY   cetirizine (ZYRTEC) 10 MG tablet Take 1 tablet (10 mg total) by mouth daily.   Krill Oil 500 MG CAPS Take 500 mg by mouth daily.   magnesium oxide (MAG-OX) 400 MG tablet Take 400 mg by mouth at bedtime.   meclizine (ANTIVERT) 12.5 MG tablet Take 1 tablet (12.5 mg total) by mouth 3 (three) times daily as needed for dizziness.   Melatonin 10 MG TABS Take 10 mg by mouth at bedtime.   metoprolol succinate (TOPROL-XL) 50 MG 24 hr tablet TAKE 1 TABLET BY  MOUTH DAILY. TAKE WITH OR IMMEDIATELY FOLLOWING A MEAL.   niacin 500 MG tablet Take 500 mg by mouth daily.   pantoprazole (PROTONIX) 40 MG tablet Take 1 tablet (40 mg total) by mouth daily.   TURMERIC CURCUMIN PO Take 750 mg by mouth daily.   VITAMIN D-VITAMIN K PO Take 250 mcg by mouth daily.   No facility-administered encounter medications on file as of 10/11/2022.     Objective:     PHYSICAL EXAMINATION:    VITALS:   Vitals:   10/11/22 0853 10/11/22 0934  BP: (!) 148/80 (!) 141/84  Pulse: 61   Resp: 18   SpO2: 97%   Weight: 162 lb (73.5 kg)   Height: 5\' 4"  (1.626 m)     GEN:  The patient appears stated age and is in NAD. HEENT:  Normocephalic, atraumatic.   Neurological examination:  General: NAD, well-groomed, appears stated age. Orientation: The patient is alert. Oriented to person, place and date Cranial nerves: There is good facial symmetry.The speech is fluent and clear. No aphasia or dysarthria. Fund of knowledge is appropriate. Recent memory impaired and remote memory is normal.  Attention and concentration are normal.  Able to name objects and repeat phrases.  Hearing is intact to conversational tone   Sensation: Sensation is intact to light touch throughout Motor: Strength is at least antigravity x4. DTR's 2/4 in UE/LE      02/26/2022    2:09 PM 05/13/2017    3:29 PM 04/18/2016   10:59 AM  Montreal Cognitive Assessment   Visuospatial/ Executive (0/5) 3  4 4  Naming (0/3) 3 3 3   Attention: Read list of digits (0/2) 2 2 2   Attention: Read list of letters (0/1) 1 1 1   Attention: Serial 7 subtraction starting at 100 (0/3) 3 3 3   Language: Repeat phrase (0/2) 2 2 2   Language : Fluency (0/1) 1 1 1   Abstraction (0/2) 2 2 2   Delayed Recall (0/5) 4 5 2   Orientation (0/6) 6 6 6   Total 27 29 26   Adjusted Score (based on education)  29 26        No data to display             Movement examination: Tone: There is normal tone in the UE/LE Abnormal  movements:  no tremor.  No myoclonus.  No asterixis.   Coordination:  There is no decremation with RAM's. Normal finger to nose  Gait and Station: The patient has no difficulty arising out of a deep-seated chair without the use of the hands. The patient's stride length is good.  Gait is cautious and narrow.   Thank you for allowing [place] the opportunity to participate in the care of this nice patient. Please do not hesitate to contact us for any questions or concerns.   Total time spent on today's visit was 27 minutes dedicated to this patient today, preparing to see patient, examining the patient, ordering tests and/or medications and counseling the patient, documenting clinical information in the EHR or other health record, independently interpreting results and communicating results to the patient/family, discussing treatment and goals, answering patient's questions and coordinating care.  Cc:  Bradd Canary, MD  Marlowe Kays 10/11/2022 12:26 PM

## 2022-10-11 NOTE — Patient Instructions (Addendum)
It was a pleasure to see you today at our office.   Recommendations:  Follow up as needed Consider psychotherapy for depression  Continue B12 supplement   Whom to call:  Memory  decline, memory medications: Call our office [phone]   For psychiatric meds, mood meds: Please have your primary care physician manage these medications.   Counseling regarding caregiver distress, including caregiver depression, anxiety and issues regarding community resources, adult day care programs, adult living facilities, or memory care questions:   Feel free to contact Misty Lisabeth Register, Social Worker at [phone]   If you have any severe symptoms of a stroke, or other severe issues such as confusion,severe chills or fever, etc call 911 or go to the ER as you may need to be evaluated further      RECOMMENDATIONS FOR ALL PATIENTS WITH MEMORY PROBLEMS: 1. Continue to exercise (Recommend 30 minutes of walking everyday, or 3 hours every week) 2. Increase social interactions - continue going to [place] and enjoy social gatherings with friends and family 3. Eat healthy, avoid fried foods and eat more fruits and vegetables 4. Maintain adequate blood pressure, blood sugar, and blood cholesterol level. Reducing the risk of stroke and cardiovascular disease also helps promoting better memory. 5. Avoid stressful situations. Live a simple life and avoid aggravations. Organize your time and prepare for the next day in anticipation. 6. Sleep well, avoid any interruptions of sleep and avoid any distractions in the bedroom that may interfere with adequate sleep quality 7. Avoid sugar, avoid sweets as there is a strong link between excessive sugar intake, diabetes, and cognitive impairment We discussed the Mediterranean diet, which has been shown to help patients reduce the risk of progressive memory disorders and reduces cardiovascular risk. This includes eating fish, eat fruits and green leafy vegetables, nuts  like almonds and hazelnuts, walnuts, and also use olive oil. Avoid fast foods and fried foods as much as possible. Avoid sweets and sugar as sugar use has been linked to worsening of memory function.  There is always a concern of gradual progression of memory problems. If this is the case, then we may need to adjust level of care according to patient needs. Support, both to the patient and caregiver, should then be put into place.    FALL PRECAUTIONS: Be cautious when walking. Scan the area for obstacles that may increase the risk of trips and falls. When getting up in the mornings, sit up at the edge of the bed for a few minutes before getting out of bed. Consider elevating the bed at the head end to avoid drop of blood pressure when getting up. Walk always in a well-lit room (use night lights in the walls). Avoid area rugs or power cords from appliances in the middle of the walkways. Use a walker or a cane if necessary and consider physical therapy for balance exercise. Get your eyesight checked regularly.  FINANCIAL OVERSIGHT: Supervision, especially oversight when making financial decisions or transactions is also recommended.  HOME SAFETY: Consider the safety of the kitchen when operating appliances like stoves, microwave oven, and blender. Consider having supervision and share cooking responsibilities until no longer able to participate in those. Accidents with firearms and other hazards in the house should be identified and addressed as well.   ABILITY TO BE LEFT ALONE: If patient is unable to contact 911 operator, consider using LifeLine, or when the need is there, arrange for someone to stay with patients. Smoking is a fire hazard, consider  supervision or cessation. Risk of wandering should be assessed by caregiver and if detected at any point, supervision and safe proof recommendations should be instituted.  MEDICATION SUPERVISION: Inability to self-administer medication needs to be  constantly addressed. Implement a mechanism to ensure safe administration of the medications.

## 2022-10-15 ENCOUNTER — Encounter (HOSPITAL_BASED_OUTPATIENT_CLINIC_OR_DEPARTMENT_OTHER): Payer: Self-pay

## 2022-10-15 ENCOUNTER — Ambulatory Visit (HOSPITAL_BASED_OUTPATIENT_CLINIC_OR_DEPARTMENT_OTHER)
Admission: RE | Admit: 2022-10-15 | Discharge: 2022-10-15 | Disposition: A | Discharge status: Home / Self Care | Payer: PPO | Source: Ambulatory Visit | Attending: Family Medicine | Admitting: Family Medicine

## 2022-10-15 DIAGNOSIS — Z1231 Encounter for screening mammogram for malignant neoplasm of breast: Secondary | ICD-10-CM | POA: Diagnosis not present

## 2022-12-09 ENCOUNTER — Institutional Professional Consult (permissible substitution): Payer: PPO | Admitting: Pulmonary Disease

## 2022-12-19 ENCOUNTER — Encounter (INDEPENDENT_AMBULATORY_CARE_PROVIDER_SITE_OTHER): Payer: Self-pay

## 2023-01-01 ENCOUNTER — Other Ambulatory Visit: Payer: Self-pay | Admitting: Family Medicine

## 2023-01-02 DIAGNOSIS — H524 Presbyopia: Secondary | ICD-10-CM | POA: Diagnosis not present

## 2023-01-02 DIAGNOSIS — Z961 Presence of intraocular lens: Secondary | ICD-10-CM | POA: Diagnosis not present

## 2023-01-21 ENCOUNTER — Ambulatory Visit (INDEPENDENT_AMBULATORY_CARE_PROVIDER_SITE_OTHER): Payer: PPO | Admitting: Family Medicine

## 2023-01-21 ENCOUNTER — Other Ambulatory Visit: Payer: Self-pay

## 2023-01-21 ENCOUNTER — Other Ambulatory Visit (HOSPITAL_BASED_OUTPATIENT_CLINIC_OR_DEPARTMENT_OTHER): Payer: Self-pay

## 2023-01-21 ENCOUNTER — Encounter: Payer: Self-pay | Admitting: Family Medicine

## 2023-01-21 VITALS — BP 126/74 | HR 74 | Temp 97.6°F | Resp 16 | Ht 64.0 in | Wt 158.6 lb

## 2023-01-21 DIAGNOSIS — H9190 Unspecified hearing loss, unspecified ear: Secondary | ICD-10-CM

## 2023-01-21 DIAGNOSIS — I10 Essential (primary) hypertension: Secondary | ICD-10-CM

## 2023-01-21 DIAGNOSIS — R32 Unspecified urinary incontinence: Secondary | ICD-10-CM | POA: Diagnosis not present

## 2023-01-21 DIAGNOSIS — R059 Cough, unspecified: Secondary | ICD-10-CM | POA: Diagnosis not present

## 2023-01-21 DIAGNOSIS — M6281 Muscle weakness (generalized): Secondary | ICD-10-CM | POA: Diagnosis not present

## 2023-01-21 DIAGNOSIS — R2681 Unsteadiness on feet: Secondary | ICD-10-CM | POA: Diagnosis not present

## 2023-01-21 DIAGNOSIS — Z0000 Encounter for general adult medical examination without abnormal findings: Secondary | ICD-10-CM | POA: Diagnosis not present

## 2023-01-21 DIAGNOSIS — M810 Age-related osteoporosis without current pathological fracture: Secondary | ICD-10-CM

## 2023-01-21 DIAGNOSIS — E785 Hyperlipidemia, unspecified: Secondary | ICD-10-CM

## 2023-01-21 HISTORY — DX: Age-related osteoporosis without current pathological fracture: M81.0

## 2023-01-21 LAB — COMPREHENSIVE METABOLIC PANEL
ALT: 14 U/L (ref 0–35)
AST: 17 U/L (ref 0–37)
Albumin: 4.3 g/dL (ref 3.5–5.2)
Alkaline Phosphatase: 77 U/L (ref 39–117)
BUN: 15 mg/dL (ref 6–23)
CO2: 29 meq/L (ref 19–32)
Calcium: 9.5 mg/dL (ref 8.4–10.5)
Chloride: 104 meq/L (ref 96–112)
Creatinine, Ser: 0.72 mg/dL (ref 0.40–1.20)
GFR: 84.13 mL/min (ref >60.00)
Glucose, Bld: 76 mg/dL (ref 70–99)
Potassium: 4.3 meq/L (ref 3.5–5.1)
Sodium: 142 meq/L (ref 135–145)
Total Bilirubin: 0.7 mg/dL (ref 0.2–1.2)
Total Protein: 7.1 g/dL (ref 6.0–8.3)

## 2023-01-21 LAB — LIPID PANEL
Cholesterol: 238 mg/dL — ABNORMAL HIGH (ref 0–200)
HDL: 59.2 mg/dL (ref >39.00)
LDL Cholesterol: 142 mg/dL — ABNORMAL HIGH (ref 0–99)
NonHDL: 178.33
Total CHOL/HDL Ratio: 4
Triglycerides: 182 mg/dL — ABNORMAL HIGH (ref 0.0–149.0)
VLDL: 36.4 mg/dL (ref 0.0–40.0)

## 2023-01-21 LAB — CBC WITH DIFFERENTIAL/PLATELET
Basophils Absolute: 0.1 10*3/uL (ref 0.0–0.1)
Basophils Relative: 0.8 % (ref 0.0–3.0)
Eosinophils Absolute: 0.1 10*3/uL (ref 0.0–0.7)
Eosinophils Relative: 2 % (ref 0.0–5.0)
HCT: 41.1 % (ref 36.0–46.0)
Hemoglobin: 13.5 g/dL (ref 12.0–15.0)
Lymphocytes Relative: 31.2 % (ref 12.0–46.0)
Lymphs Abs: 2.2 10*3/uL (ref 0.7–4.0)
MCHC: 32.8 g/dL (ref 30.0–36.0)
MCV: 92.2 fl (ref 78.0–100.0)
Monocytes Absolute: 0.4 10*3/uL (ref 0.1–1.0)
Monocytes Relative: 5.5 % (ref 3.0–12.0)
Neutro Abs: 4.3 10*3/uL (ref 1.4–7.7)
Neutrophils Relative %: 60.5 % (ref 43.0–77.0)
Platelets: 216 10*3/uL (ref 150.0–400.0)
RBC: 4.46 Mil/uL (ref 3.87–5.11)
RDW: 12.9 % (ref 11.5–15.5)
WBC: 7.1 10*3/uL (ref 4.0–10.5)

## 2023-01-21 LAB — URINALYSIS, ROUTINE W REFLEX MICROSCOPIC
Bilirubin Urine: NEGATIVE
Hgb urine dipstick: NEGATIVE
Ketones, ur: NEGATIVE
Leukocytes,Ua: NEGATIVE
Nitrite: POSITIVE — AB
RBC / HPF: NONE SEEN (ref 0–?)
Specific Gravity, Urine: 1.01 (ref 1.000–1.030)
Total Protein, Urine: NEGATIVE
Urine Glucose: NEGATIVE
Urobilinogen, UA: 0.2 (ref 0.0–1.0)
pH: 7 (ref 5.0–8.0)

## 2023-01-21 LAB — VITAMIN D 25 HYDROXY (VIT D DEFICIENCY, FRACTURES): VITD: 86.02 ng/mL (ref 30.00–100.00)

## 2023-01-21 LAB — TSH: TSH: 3.54 u[IU]/mL (ref 0.35–5.50)

## 2023-01-21 MED ORDER — ZOSTER VAC RECOMB ADJUVANTED 50 MCG/0.5ML IM SUSR
0.5000 mL | Freq: Once | INTRAMUSCULAR | 0 refills | Status: AC
  Filled 2023-01-21: qty 0.5, 1d supply, fill #0

## 2023-01-21 MED ORDER — METOPROLOL SUCCINATE ER 50 MG PO TB24: 50.0000 mg | ORAL_TABLET | Freq: Every day | ORAL | 1 refills | Status: DC

## 2023-01-21 NOTE — Patient Instructions (Addendum)
The Covid and flu and shingrix x2 and Prevnar 20 at pharmacy at [facility] Protein every 4 hours and water 10 ounces every 1-2 hours  [employer] Preventive Care 65 Years and Older, Female Preventive care refers to lifestyle choices and visits with your health care provider that can promote health and wellness. Preventive care visits are also called wellness exams. What can I expect for my preventive care visit? Counseling Your health care provider may ask you questions about your: Medical history, including: Past medical problems. Family medical history. Pregnancy and menstrual history. History of falls. Current health, including: Memory and ability to understand (cognition). Emotional well-being. Home life and relationship well-being. Sexual activity and sexual health. Lifestyle, including: Alcohol, nicotine or tobacco, and drug use. Access to firearms. Diet, exercise, and sleep habits. Work and work Astronomer. Sunscreen use. Safety issues such as seatbelt and bike helmet use. Physical exam Your health care provider will check your: Height and weight. These may be used to calculate your BMI (body mass index). BMI is a measurement that tells if you are at a healthy weight. Waist circumference. This measures the distance around your waistline. This measurement also tells if you are at a healthy weight and may help predict your risk of certain diseases, such as type 2 diabetes and high blood pressure. Heart rate and blood pressure. Body temperature. Skin for abnormal spots. What immunizations do I need?  Vaccines are usually given at various ages, according to a schedule. Your health care provider will recommend vaccines for you based on your age, medical history, and lifestyle or other factors, such as travel or where you work. What tests do I need? Screening Your health care provider may recommend screening tests for certain conditions. This may include: Lipid and  cholesterol levels. Hepatitis C test. Hepatitis B test. HIV (human immunodeficiency virus) test. STI (sexually transmitted infection) testing, if you are at risk. Lung cancer screening. Colorectal cancer screening. Diabetes screening. This is done by checking your blood sugar (glucose) after you have not eaten for a while (fasting). Mammogram. Talk with your health care provider about how often you should have regular mammograms. BRCA-related cancer screening. This may be done if you have a family history of breast, ovarian, tubal, or peritoneal cancers. Bone density scan. This is done to screen for osteoporosis. Talk with your health care provider about your test results, treatment options, and if necessary, the need for more tests. Follow these instructions at home: Eating and drinking  Eat a diet that includes fresh fruits and vegetables, whole grains, lean protein, and low-fat dairy products. Limit your intake of foods with high amounts of sugar, saturated fats, and salt. Take vitamin and mineral supplements as recommended by your health care provider. Do not drink alcohol if your health care provider tells you not to drink. If you drink alcohol: Limit how much you have to 0-1 drink a day. Know how much alcohol is in your drink. In the [place] one drink equals one 12 oz bottle of beer (355 mL), one 5 oz glass of wine (148 mL), or one 1 oz glass of hard liquor (44 mL). Lifestyle Brush your teeth every morning and night with fluoride toothpaste. Floss one time each day. Exercise for at least 30 minutes 5 or more days each week. Do not use any products that contain nicotine or tobacco. These products include cigarettes, chewing tobacco, and vaping devices, such as e-cigarettes. If you need help quitting, ask your health care provider. Do  not use drugs. If you are sexually active, practice safe sex. Use a condom or other form of protection in order to prevent STIs. Take aspirin only as told  by your health care provider. Make sure that you understand how much to take and what form to take. Work with your health care provider to find out whether it is safe and beneficial for you to take aspirin daily. Ask your health care provider if you need to take a cholesterol-lowering medicine (statin). Find healthy ways to manage stress, such as: Meditation, yoga, or listening to music. Journaling. Talking to a trusted person. Spending time with friends and family. Minimize exposure to UV radiation to reduce your risk of skin cancer. Safety Always wear your seat belt while driving or riding in a vehicle. Do not drive: If you have been drinking alcohol. Do not ride with someone who has been drinking. When you are tired or distracted. While texting. If you have been using any mind-altering substances or drugs. Wear a helmet and other protective equipment during sports activities. If you have firearms in your house, make sure you follow all gun safety procedures. What's next? Visit your health care provider once a year for an annual wellness visit. Ask your health care provider how often you should have your eyes and teeth checked. Stay up to date on all vaccines. This information is not intended to replace advice given to you by your health care provider. Make sure you discuss any questions you have with your health care provider. Document Revised: 10/18/2020 Document Reviewed: 10/18/2020 Elsevier Patient Education  2024 [employer].

## 2023-01-21 NOTE — Assessment & Plan Note (Addendum)
Well controlled, no changes to meds, had run out of her Metoprolol and is well controlled without it so we will not restart Metoprolol. Encouraged heart healthy diet such as the DASH diet and exercise as tolerated.

## 2023-01-21 NOTE — Assessment & Plan Note (Signed)
Encourage heart healthy diet such as MIND or DASH diet, increase exercise, avoid trans fats, simple carbohydrates and processed foods, consider a krill or fish or flaxseed oil cap daily.  °

## 2023-01-21 NOTE — Assessment & Plan Note (Addendum)
Patient encouraged to maintain heart healthy diet, regular exercise, adequate sleep. Consider daily probiotics. Take medications as prescribed. Labs ordered and reviewed. Given and reviewed copy of ACP documents from [employer] and encouraged to complete and return. Colonoscopy 07/2021 repeat in 3 years, [facility] 2024 repeat next year

## 2023-01-22 DIAGNOSIS — R32 Unspecified urinary incontinence: Secondary | ICD-10-CM | POA: Insufficient documentation

## 2023-01-22 DIAGNOSIS — H9190 Unspecified hearing loss, unspecified ear: Secondary | ICD-10-CM | POA: Insufficient documentation

## 2023-01-22 DIAGNOSIS — M6281 Muscle weakness (generalized): Secondary | ICD-10-CM | POA: Insufficient documentation

## 2023-01-22 NOTE — Progress Notes (Signed)
Subjective:    Patient ID: Brittany Salinas, female    DOB: 1951-01-02, 72 y.o.   MRN: [id number]  Chief Complaint  Patient presents with  . Annual Exam    Annual Exam    HPI Discussed the use of AI scribe software for clinical note transcription with the patient, who gave verbal consent to proceed.  History of Present Illness   The patient, with a history of COVID-19, presents with multiple concerns. They report unintentional weight loss, with a decrease from 162 lbs to 158 lbs over a period of six months. The patient denies any intentional changes in diet or exercise, but notes a decrease in appetite and changes in taste and smell since recovering from COVID-19.  The patient also reports memory issues, but neurology and neuropsychology evaluations, including a brain scan, were reportedly normal for age  The patient has noticed a decrease in hearing, but has not yet sought evaluation. They also report a persistent, dry cough since recovering from COVID-19. The cough is not severe enough to cause choking or difficulty breathing, but has led to changes in the patient's voice, including weakness and raspiness.  The patient also reports urinary incontinence, describing it as an "oopsie" with dribbling and urgency. They note that they have to go to the bathroom immediately when they feel the urge, or they risk leakage. The patient has previously seen a physical therapist for issues related to broken toes and subsequent complications, and is considering returning for help with their current gait instability.        Past Medical History:  Diagnosis Date  . Blood transfusion without reported diagnosis 1997   Complications c-section  . Cataracts, both eyes   . Closed fracture of fifth metatarsal bone 12/14/2021  . Closed fracture of fourth metatarsal bone 12/14/2021  . Colon polyp 10/09/2017  . Dyspareunia in female 11/16/2018  . Family history of adverse reaction to anesthesia     mother and sister - slow to awaken  . Gait disturbance 04/18/2016  . Generalized anxiety disorder 09/17/2022  . History of COVID-19 03/16/2021  . Hyperlipidemia   . Hypertension   . Insomnia   . Left elbow fracture 02/12/2020  . Left knee pain 05/13/2017  . Lumbar radiculopathy 01/22/2022  . Lumbar spondylosis 03/05/2022  . Major depressive disorder 04/18/2016  . Obesity 04/13/2018  . Osteoarthritis   . Osteoporosis 01/21/2023  . Pain in joint of right hip 01/04/2022  . Right foot pain 10/09/2017  . Subjective memory complaints 04/18/2016  . Vaginitis 04/13/2018  . Vertigo 01/29/2019  . Vision abnormalities     Past Surgical History:  Procedure Laterality Date  . BREAST BIOPSY Right    benign  . BREAST REDUCTION SURGERY  2007  . CESAREAN SECTION     4 c-sections  . COLONOSCOPY  2008  . COLONOSCOPY W/ POLYPECTOMY  11/15/2011   Dr. Magda Kiel. [facility]. A single semi-pedunculated non-bleeding polyp of benigh apperance was found in the distal ascending colon. A single-piece polypectomy was performed using hot snare in the distal ascending colon. The polyp was completely removed.  Marland Kitchen EYE SURGERY Bilateral    catarcts  . FRACTURE SURGERY  Oct 2021   Shattered elbow  . ORIF ELBOW FRACTURE Left 02/12/2020   Procedure: Left elbow olecranon osteotomy, ulnar nerve release, and surgical reconstruction distal end of the humerus with repair;  Surgeon: Dominica Severin, MD;  Location: [facility] OR;  Service: Orthopedics;  Laterality: Left;  2.5 hrs  . REDUCTION  MAMMAPLASTY Bilateral   . ROTATOR CUFF REPAIR Bilateral    right in 2010, left in 2017.  . TONSILLECTOMY    . TUBAL LIGATION      Family History  Problem Relation Age of Onset  . Colon polyps Mother   . Heart defect Mother        valvular heart disease  . Depression Mother   . Memory loss Mother        vascular in nature  . Heart attack Father   . Hyperlipidemia Father   . Hypertension Father   . Colon polyps Sister    . Marfan syndrome Sister   . Other Sister        connective tissue disorder  . Hypertension Brother   . Other Brother        vertigo  . Cancer Maternal Grandmother        stomach  . Asthma Maternal Grandfather   . Stroke Maternal Grandfather   . Marfan syndrome Maternal Grandfather        ?  Marland Kitchen Alcohol abuse Maternal Grandfather   . Psoriasis Daughter   . Mental illness Son        depression alcohol in past  . Depression Son   . Arrhythmia Son   . Alcohol abuse Son        h/o drug abuse  . Breast cancer Maternal Aunt   . Colon cancer Neg Hx   . Esophageal cancer Neg Hx   . Rectal cancer Neg Hx   . Stomach cancer Neg Hx     Social History   Socioeconomic History  . Marital status: Married    Spouse name: Not on file  . Number of children: Not on file  . Years of education: 71  . Highest education level: Bachelor's degree (e.g., BA, AB, BS)  Occupational History  . Occupation: Retired  Tobacco Use  . Smoking status: Never  . Smokeless tobacco: Never  Vaping Use  . Vaping status: Never Used  Substance and Sexual Activity  . Alcohol use: Yes    Alcohol/week: 2.0 standard drinks of alcohol    Types: 2 Glasses of wine per week  . Drug use: No  . Sexual activity: Not on file  Other Topics Concern  . Not on file  Social History Narrative   Works as Teacher, adult education, 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 Uses occasional light alcohol   Right handed   2 floor home   Social Determinants of Health   Financial Resource Strain: Low Risk  (05/15/2021)   Overall Financial Resource Strain (CARDIA)   . Difficulty of Paying Living Expenses: Not hard at all  Food Insecurity: No Food Insecurity (05/24/2022)   Hunger Vital Sign   . Worried About Programme researcher, broadcasting/film/video in the Last Year: Never true   . Ran Out of Food in the Last Year: Never true  Transportation Needs: No Transportation Needs (05/24/2022)   PRAPARE - Transportation   . Lack of  Transportation (Medical): No   . Lack of Transportation (Non-Medical): No  Physical Activity: Insufficiently Active (05/15/2021)   Exercise Vital Sign   . Days of Exercise per Week: 3 days   . Minutes of Exercise per Session: 30 min  Stress: No Stress Concern Present (05/15/2021)   [employer] of Occupational Health - Occupational Stress Questionnaire   . Feeling of Stress : Not at all  Social Connections: Socially Integrated (05/15/2021)   Social Connection  and Isolation Panel [NHANES]   . Frequency of Communication with Friends and Family: More than three times a week   . Frequency of Social Gatherings with Friends and Family: More than three times a week   . Attends Religious Services: More than 4 times per year   . Active Member of Clubs or Organizations: Yes   . Attends Banker Meetings: More than 4 times per year   . Marital Status: Married  Catering manager Violence: Not At Risk (05/24/2022)   Humiliation, Afraid, Rape, and Kick questionnaire   . Fear of Current or Ex-Partner: No   . Emotionally Abused: No   . Physically Abused: No   . Sexually Abused: No    Outpatient Medications Prior to Visit  Medication Sig Dispense Refill  . Ascorbic Acid (VITAMIN C) 1000 MG tablet Take 1,000 mg by mouth daily.    Marland Kitchen aspirin EC 81 MG tablet Take 81 mg by mouth daily. Swallow whole.    . Azelastine-Fluticasone 137-50 MCG/ACT SUSP Place 1 spray into the nose every 12 (twelve) hours. 23 g 3  . b complex vitamins capsule Take 1 capsule by mouth daily.    Marland Kitchen buPROPion (WELLBUTRIN XL) 300 MG 24 hr tablet TAKE 1 TABLET BY MOUTH EVERY DAY 90 tablet 1  . Krill Oil 500 MG CAPS Take 500 mg by mouth daily.    . magnesium oxide (MAG-OX) 400 MG tablet Take 400 mg by mouth at bedtime.    . meclizine (ANTIVERT) 12.5 MG tablet Take 1 tablet (12.5 mg total) by mouth 3 (three) times daily as needed for dizziness. 30 tablet 0  . Melatonin 10 MG TABS Take 10 mg by mouth at bedtime.    . niacin  500 MG tablet Take 500 mg by mouth daily.    . TURMERIC CURCUMIN PO Take 750 mg by mouth daily.    Marland Kitchen VITAMIN D-VITAMIN K PO Take 250 mcg by mouth daily.    . metoprolol succinate (TOPROL-XL) 50 MG 24 hr tablet TAKE 1 TABLET BY MOUTH DAILY. TAKE WITH OR IMMEDIATELY FOLLOWING A MEAL. 90 tablet 1  . benzonatate (TESSALON) 100 MG capsule Take 1 capsule (100 mg total) by mouth 3 (three) times daily as needed for cough. 30 capsule 0  . cetirizine (ZYRTEC) 10 MG tablet Take 1 tablet (10 mg total) by mouth daily. 30 tablet 11  . pantoprazole (PROTONIX) 40 MG tablet Take 1 tablet (40 mg total) by mouth daily. 30 tablet 3   No facility-administered medications prior to visit.    Allergies  Allergen Reactions  . Codeine Nausea And Vomiting  . Crestor [Rosuvastatin]     Muscle aches    Review of Systems  Constitutional:  Positive for malaise/fatigue. Negative for chills and fever.  HENT:  Negative for congestion and hearing loss.   Eyes:  Negative for discharge.  Respiratory:  Negative for cough, sputum production and shortness of breath.   Cardiovascular:  Negative for chest pain, palpitations and leg swelling.  Gastrointestinal:  Negative for abdominal pain, blood in stool, constipation, diarrhea, heartburn, nausea and vomiting.  Genitourinary:  Positive for frequency. Negative for dysuria, hematuria and urgency.  Musculoskeletal:  Positive for myalgias. Negative for back pain and falls.  Skin:  Negative for rash.  Neurological:  Negative for dizziness, sensory change, loss of consciousness, weakness and headaches.  Endo/Heme/Allergies:  Negative for environmental allergies. Does not bruise/bleed easily.  Psychiatric/Behavioral:  Negative for depression and suicidal ideas. The patient is not nervous/anxious  and does not have insomnia.       Objective:    Physical Exam Constitutional:      General: She is not in acute distress.    Appearance: Normal appearance. She is well-developed. She  is not toxic-appearing.  HENT:     Head: Normocephalic and atraumatic.     Right Ear: External ear normal.     Left Ear: External ear normal.     Nose: Nose normal.  Eyes:     General:        Right eye: No discharge.        Left eye: No discharge.     Conjunctiva/sclera: Conjunctivae normal.  Neck:     Thyroid: No thyromegaly.  Cardiovascular:     Rate and Rhythm: Normal rate and regular rhythm.     Heart sounds: Normal heart sounds. No murmur heard. Pulmonary:     Effort: Pulmonary effort is normal. No respiratory distress.     Breath sounds: Normal breath sounds.  Abdominal:     General: Bowel sounds are normal.     Palpations: Abdomen is soft.     Tenderness: There is no abdominal tenderness. There is no guarding.  Musculoskeletal:        General: Normal range of motion.     Cervical back: Neck supple.  Lymphadenopathy:     Cervical: No cervical adenopathy.  Skin:    General: Skin is warm and dry.  Neurological:     Mental Status: She is alert and oriented to person, place, and time.  Psychiatric:        Mood and Affect: Mood normal.        Behavior: Behavior normal.        Thought Content: Thought content normal.        Judgment: Judgment normal.   BP 126/74 (BP Location: Left Arm, Patient Position: Sitting, Cuff Size: Normal)   Pulse 74   Temp 97.6 F (36.4 C) (Oral)   Resp 16   Ht 5\' 4"  (1.626 m)   Wt 158 lb 9.6 oz (71.9 kg)   SpO2 98%   BMI 27.22 kg/m  Wt Readings from Last 3 Encounters:  01/21/23 158 lb 9.6 oz (71.9 kg)  10/11/22 162 lb (73.5 kg)  10/08/22 162 lb (73.5 kg)    Diabetic Foot Exam - Simple   No data filed    Lab Results  Component Value Date   WBC 7.1 01/21/2023   HGB 13.5 01/21/2023   HCT 41.1 01/21/2023   PLT 216.0 01/21/2023   GLUCOSE 76 01/21/2023   CHOL 238 (H) 01/21/2023   TRIG 182.0 (H) 01/21/2023   HDL 59.20 01/21/2023   LDLDIRECT 99.0 08/30/2021   LDLCALC 142 (H) 01/21/2023   ALT 14 01/21/2023   AST 17 01/21/2023    NA 142 01/21/2023   K 4.3 01/21/2023   CL 104 01/21/2023   CREATININE 0.72 01/21/2023   BUN 15 01/21/2023   CO2 29 01/21/2023   TSH 3.54 01/21/2023    Lab Results  Component Value Date   TSH 3.54 01/21/2023   Lab Results  Component Value Date   WBC 7.1 01/21/2023   HGB 13.5 01/21/2023   HCT 41.1 01/21/2023   MCV 92.2 01/21/2023   PLT 216.0 01/21/2023   Lab Results  Component Value Date   NA 142 01/21/2023   K 4.3 01/21/2023   CO2 29 01/21/2023   GLUCOSE 76 01/21/2023   BUN 15 01/21/2023   CREATININE 0.72 01/21/2023  BILITOT 0.7 01/21/2023   ALKPHOS 77 01/21/2023   AST 17 01/21/2023   ALT 14 01/21/2023   PROT 7.1 01/21/2023   ALBUMIN 4.3 01/21/2023   CALCIUM 9.5 01/21/2023   ANIONGAP 11 02/12/2020   GFR 84.13 01/21/2023   Lab Results  Component Value Date   CHOL 238 (H) 01/21/2023   Lab Results  Component Value Date   HDL 59.20 01/21/2023   Lab Results  Component Value Date   LDLCALC 142 (H) 01/21/2023   Lab Results  Component Value Date   TRIG 182.0 (H) 01/21/2023   Lab Results  Component Value Date   CHOLHDL 4 01/21/2023   No results found for: "HGBA1C"     Assessment & Plan:  Primary hypertension Assessment & Plan: Well controlled, no changes to meds, had run out of her Metoprolol and is well controlled without it so we will not restart Metoprolol. Encouraged heart healthy diet such as the DASH diet and exercise as tolerated.   Orders: -     CBC with Differential/Platelet -     Comprehensive metabolic panel -     TSH  Hyperlipidemia, unspecified hyperlipidemia type Assessment & Plan: Encourage heart healthy diet such as MIND or DASH diet, increase exercise, avoid trans fats, simple carbohydrates and processed foods, consider a krill or fish or flaxseed oil cap daily.   Orders: -     Lipid panel  Urinary incontinence, unspecified type -     Urinalysis -     Urine Culture  Unsteady gait -     Ambulatory referral to Physical  Therapy  Muscle weakness -     Ambulatory referral to Physical Therapy  Cough, unspecified type -     Ambulatory referral to ENT  Preventative health care Assessment & Plan: Patient encouraged to maintain heart healthy diet, regular exercise, adequate sleep. Consider daily probiotics. Take medications as prescribed. Labs ordered and reviewed. Given and reviewed copy of ACP documents from [employer] and encouraged to complete and return. Colonoscopy 07/2021 repeat in 3 years, [facility] 2024 repeat next year   Hearing loss, unspecified hearing loss type, unspecified laterality -     Ambulatory referral to Audiology  Osteoporosis, unspecified osteoporosis type, unspecified pathological fracture presence -     VITAMIN D 25 Hydroxy (Vit-D Deficiency, Fractures)  Other orders -     Urinalysis, Routine w reflex microscopic    Assessment and Plan    Unintentional Weight Loss Gradual weight loss over the past year. No associated symptoms such as abdominal pain, changes in bowel habits, or chest pain. Discussed the natural decrease in lean muscle mass and caloric needs with aging. -Increase protein intake and hydration. -Check weight in January/February to assess for stabilization.  Urinary Incontinence Reports occasional dribbling without any precursors. No complete loss of control. -Consider pelvic floor therapy or medication. -Consider referral to urogynecologist for further evaluation if needed.  Chronic Cough Persistent dry cough since COVID infection. No associated fever, chills, or productive cough. Voice changes noted with weakness and raspiness. -Refer to ENT for further evaluation. -Consider referral to gastroenterology if no improvement or new symptoms arise.  Hearing Loss Reports a decrease in hearing. -Refer to [facility] for hearing evaluation.  Memory Concerns Reports some memory issues. Discussed the importance of physical activity, hydration, and sleep for  brain health. -Consider repeating bone density test after the holidays.  General Health Maintenance -Administer Shingrix vaccine today. -Plan for COVID booster, flu vaccine, and Prevnar 20 in the  future. -Collect urine sample and blood work today.         Danise Edge, MD

## 2023-01-23 ENCOUNTER — Encounter: Payer: Self-pay | Admitting: Pulmonary Disease

## 2023-01-23 ENCOUNTER — Ambulatory Visit: Payer: PPO | Admitting: Pulmonary Disease

## 2023-01-23 VITALS — BP 136/80 | HR 74 | Ht 64.0 in | Wt 157.2 lb

## 2023-01-23 DIAGNOSIS — R053 Chronic cough: Secondary | ICD-10-CM | POA: Diagnosis not present

## 2023-01-23 LAB — URINE CULTURE
MICRO NUMBER:: 15477347
SPECIMEN QUALITY:: ADEQUATE

## 2023-01-23 MED ORDER — MOMETASONE FURO-FORMOTEROL FUM 200-5 MCG/ACT IN AERO: 2.0000 | INHALATION_SPRAY | Freq: Two times a day (BID) | RESPIRATORY_TRACT | 3 refills | Status: DC

## 2023-01-23 MED ORDER — CEFDINIR 300 MG PO CAPS: 300.0000 mg | ORAL_CAPSULE | Freq: Two times a day (BID) | ORAL | 0 refills | Status: DC

## 2023-01-23 NOTE — Addendum Note (Signed)
Addended by: Wilford Corner on: 01/23/2023 10:35 AM   Modules accepted: Orders

## 2023-01-23 NOTE — Addendum Note (Signed)
Addended byConrad  D on: 01/23/2023 02:39 PM   Modules accepted: Orders

## 2023-01-23 NOTE — Patient Instructions (Signed)
It is nice to meet you  Given symptoms seem to develop after COVID and subtle signs on your chest x-ray and lack of other symptoms, I think most likely cause of your cough is related to asthma or inflammation in the air tubes.  Likely triggered with your COVID infections in the past.  Use Dulera 2 puffs in the morning and 2 puffs in the evening.  Rinse your mouth out with water after every use.  Consider placing it in the drawer with your toothbrush to aid in remembering taking it and also to aid in remembering to rinsing your mouth.  If this is too expensive let me know, the alternative is likely a dry powder inhaler.  I am trying to avoid this as sometimes this can contribute to cough.  However, if that is all of her left with we can use it.  Return to clinic in 3 months or sooner as needed with Dr. Judeth Horn

## 2023-01-23 NOTE — Progress Notes (Signed)
@Patient  ID: Brittany Salinas, female    DOB: 02-21-1951, 72 y.o.   MRN: [id number]  Chief Complaint  Patient presents with   Consult    Pt complains of Chronic Cough for since 2021.     Referring provider: Clayborne Dana, NP  HPI:   72 y.o. woman whom we are seeing for evaluation of chronic cough.  Multiple PCP notes reviewed.  Patient notes no history of cough.  No diagnosis of asthma or other breathing issues.  Developed COVID in 2021.  Subsequent infection sounds like in 2022.  Since 2021, she has had a cough.  Certainly be acute symptoms, common viral symptoms all got better.  Overall frequency and severity of cough has improved.  However she has a lingering chronic dry cough.  Maybe a bit more noticeable in the evening per the husband.  No position that make things better or worse.  No seasonal or environmental factors she can identify to make things better or worse.  Breathing exercises seem to make it better.  Long conversations and singing seem to make it worse.  No other alleviating or exacerbating factors.  Never used an inhaler.  Denies any exercise intolerance or significant dyspnea on exertion.  No GERD.  Occasional runny nose, seasonal allergies.  But cough does not worsen or improve related to the severity of the nasal symptoms.  She has a never smoker.  Chest x-ray, most recent chest imaging, 10/2022 with clear lungs and mild hyperinflation on my review and interpretation.  Similar findings noted 09/22/2021 and 03/25/2021 on my review and interpretation.    Questionaires / Pulmonary Flowsheets:   ACT:      No data to display          MMRC:     No data to display          Epworth:     01/23/2023    8:00 AM  Results of the Epworth flowsheet  Sitting and reading 3  Watching TV 1  Sitting, inactive in a public place (e.g. a theatre or a meeting) 0  As a passenger in a car for an hour without a break 1  Lying down to rest in the afternoon when  circumstances permit 3  Sitting and talking to someone 0  Sitting quietly after a lunch without alcohol 2  In a car, while stopped for a few minutes in traffic 0  Total score 10    Tests:   FENO:  No results found for: "NITRICOXIDE"  PFT:     No data to display          WALK:      No data to display          Imaging: Personally reviewed and as per EMR and discussion in this note No results found.  Lab Results: Personally reviewed CBC    Component Value Date/Time   WBC 7.1 01/21/2023 1146   RBC 4.46 01/21/2023 1146   HGB 13.5 01/21/2023 1146   HCT 41.1 01/21/2023 1146   PLT 216.0 01/21/2023 1146   MCV 92.2 01/21/2023 1146   MCH 30.0 02/12/2020 0649   MCHC 32.8 01/21/2023 1146   RDW 12.9 01/21/2023 1146   LYMPHSABS 2.2 01/21/2023 1146   MONOABS 0.4 01/21/2023 1146   EOSABS 0.1 01/21/2023 1146   BASOSABS 0.1 01/21/2023 1146    BMET    Component Value Date/Time   NA 142 01/21/2023 1146   K 4.3 01/21/2023 1146   CL  104 01/21/2023 1146   CO2 29 01/21/2023 1146   GLUCOSE 76 01/21/2023 1146   BUN 15 01/21/2023 1146   CREATININE 0.72 01/21/2023 1146   CALCIUM 9.5 01/21/2023 1146   GFRNONAA >60 02/12/2020 0649    BNP No results found for: "BNP"  ProBNP No results found for: "PROBNP"  Specialty Problems       Pulmonary Problems   Cough    Allergies  Allergen Reactions   Codeine Nausea And Vomiting   Crestor [Rosuvastatin]     Muscle aches    Immunization History  Administered Date(s) Administered   Fluad Quad(high Dose 65+) 01/26/2019   PFIZER Comirnaty(Gray Top)Covid-19 Tri-Sucrose Vaccine 07/30/2020   PFIZER(Purple Top)SARS-COV-2 Vaccination 06/26/2019, 07/19/2019   Zoster Recombinant(Shingrix) 01/21/2023    Past Medical History:  Diagnosis Date   Blood transfusion without reported diagnosis 1997   Complications c-section   Cataracts, both eyes    Closed fracture of fifth metatarsal bone 12/14/2021   Closed fracture of fourth  metatarsal bone 12/14/2021   Colon polyp 10/09/2017   Dyspareunia in female 11/16/2018   Family history of adverse reaction to anesthesia    mother and sister - slow to awaken   Gait disturbance 04/18/2016   Generalized anxiety disorder 09/17/2022   History of COVID-19 03/16/2021   Hyperlipidemia    Hypertension    Insomnia    Left elbow fracture 02/12/2020   Left knee pain 05/13/2017   Lumbar radiculopathy 01/22/2022   Lumbar spondylosis 03/05/2022   Major depressive disorder 04/18/2016   Obesity 04/13/2018   Osteoarthritis    Osteoporosis 01/21/2023   Pain in joint of right hip 01/04/2022   Right foot pain 10/09/2017   Subjective memory complaints 04/18/2016   Vaginitis 04/13/2018   Vertigo 01/29/2019   Vision abnormalities     Tobacco History: Social History   Tobacco Use  Smoking Status Never  Smokeless Tobacco Never   Counseling given: Not Answered   Continue to not smoke  Outpatient Encounter Medications as of 01/23/2023  Medication Sig   Ascorbic Acid (VITAMIN C) 1000 MG tablet Take 1,000 mg by mouth daily.   aspirin EC 81 MG tablet Take 81 mg by mouth daily. Swallow whole.   Azelastine-Fluticasone 137-50 MCG/ACT SUSP Place 1 spray into the nose every 12 (twelve) hours.   b complex vitamins capsule Take 1 capsule by mouth daily.   buPROPion (WELLBUTRIN XL) 300 MG 24 hr tablet TAKE 1 TABLET BY MOUTH EVERY DAY   Krill Oil 500 MG CAPS Take 1,200 mg by mouth daily.   magnesium oxide (MAG-OX) 400 MG tablet Take 400 mg by mouth at bedtime.   meclizine (ANTIVERT) 12.5 MG tablet Take 1 tablet (12.5 mg total) by mouth 3 (three) times daily as needed for dizziness.   Melatonin 10 MG TABS Take 5 mg by mouth at bedtime.   mometasone-formoterol (DULERA) 200-5 MCG/ACT AERO Inhale 2 puffs into the lungs 2 (two) times daily.   niacin 500 MG tablet Take 500 mg by mouth daily.   TURMERIC CURCUMIN PO Take 750 mg by mouth daily.   VITAMIN D-VITAMIN K PO Take 250 mcg by mouth  daily.   No facility-administered encounter medications on file as of 01/23/2023.     Review of Systems  Review of Systems  No chest pain with exertion.  No orthopnea or PND.  Comprehensive review of systems otherwise negative. Physical Exam  BP 136/80 (BP Location: Right Arm, Cuff Size: Normal)   Pulse 74   Ht 5'  4" (1.626 m)   Wt 157 lb 3.2 oz (71.3 kg)   SpO2 99%   BMI 26.98 kg/m   Wt Readings from Last 5 Encounters:  01/23/23 157 lb 3.2 oz (71.3 kg)  01/21/23 158 lb 9.6 oz (71.9 kg)  10/11/22 162 lb (73.5 kg)  10/08/22 162 lb (73.5 kg)  04/18/22 169 lb (76.7 kg)    BMI Readings from Last 5 Encounters:  01/23/23 26.98 kg/m  01/21/23 27.22 kg/m  10/11/22 27.81 kg/m  10/08/22 27.81 kg/m  04/18/22 29.01 kg/m     Physical Exam General: Sitting in chair, no acute distress Eyes: EOMI, no icterus Neck: Supple, no JVP Pulmonary: Clear, good air excursion, normal work of breathing Cardiovascular: Regular rate and rhythm, 1 out of 6 midsystolic murmur heard best at right upper sternal border, no edema Abdomen: Nondistended bowel sounds present MSK: No synovitis, no joint effusion Neuro: Normal gait, no weakness   Assessment & Plan:   Chronic cough: Present for 3 years.  Seems triggered by viral infection.  High suspicion for postviral syndrome and with length of symptoms development of asthma.  Mild hyperinflation on chest x-ray 10/2022.  Deny significant postnasal drip or GERD symptoms.  New prescription high-dose Dulera 2 puff twice daily.  Assess response to ICS/LABA therapy.  Consider empiric PPI therapy despite lack of GERD symptoms given significant portion of cough related to GERD is asymptomatic.   Return in about 9 weeks (around 03/27/2023) for f/u Dr. Judeth Horn.   Brittany Burly, Brittany Salinas 01/23/2023   This appointment required 60 minutes of patient care (this includes precharting, chart review, review of results, face-to-face care, etc.).

## 2023-01-24 ENCOUNTER — Encounter: Payer: Self-pay | Admitting: Pulmonary Disease

## 2023-01-31 ENCOUNTER — Telehealth: Payer: Self-pay | Admitting: Pulmonary Disease

## 2023-01-31 NOTE — Telephone Encounter (Signed)
[employer] needs to know why she is on the medication dullera; what medical condition does she have. Please call and advise.

## 2023-02-03 NOTE — Telephone Encounter (Signed)
   [employer] needs to know why she is on the medication dullera; what medical condition does she have. Please call and advise.

## 2023-02-04 ENCOUNTER — Telehealth: Payer: Self-pay | Admitting: Pulmonary Disease

## 2023-02-04 MED ORDER — MOMETASONE FURO-FORMOTEROL FUM 200-5 MCG/ACT IN AERO: 2.0000 | INHALATION_SPRAY | Freq: Two times a day (BID) | RESPIRATORY_TRACT | 3 refills | Status: DC

## 2023-02-04 NOTE — Telephone Encounter (Signed)
Called [employer] regarding Brittany Salinas and spoke with [name].  [name] stated [facility] PA was denied.  Appeal letter is being faxed to office for provider and PA team to review. [facility] main fax number given. Nothing further at this time.

## 2023-02-04 NOTE — Telephone Encounter (Signed)
Patient needs a refill of Dellera if that isn't prescribed properly then there are three other options that are available. or a refill of a similar medication;  bro ellipta, wixeleinhub, or fluticasonetrotion-salmeterol.

## 2023-02-05 NOTE — Telephone Encounter (Signed)
Brittany Salinas was denied  They are rec that she try breo or wixela  Please advise, thanks

## 2023-02-06 ENCOUNTER — Other Ambulatory Visit (HOSPITAL_COMMUNITY): Payer: Self-pay

## 2023-02-06 NOTE — Telephone Encounter (Signed)
Breztri samples placed at front for patient - do not want DPI with cough.

## 2023-02-10 NOTE — Telephone Encounter (Signed)
Patient picked up samples of Breztri. Nothing further needed.

## 2023-02-12 ENCOUNTER — Encounter (INDEPENDENT_AMBULATORY_CARE_PROVIDER_SITE_OTHER): Payer: Self-pay | Admitting: Otolaryngology

## 2023-03-06 ENCOUNTER — Ambulatory Visit: Payer: PPO | Admitting: Family Medicine

## 2023-03-06 VITALS — BP 137/66 | HR 60 | Temp 98.1°F | Ht 64.0 in | Wt 156.0 lb

## 2023-03-06 DIAGNOSIS — J0110 Acute frontal sinusitis, unspecified: Secondary | ICD-10-CM | POA: Diagnosis not present

## 2023-03-06 DIAGNOSIS — J069 Acute upper respiratory infection, unspecified: Secondary | ICD-10-CM

## 2023-03-06 MED ORDER — AMOXICILLIN-POT CLAVULANATE 875-125 MG PO TABS
1.0000 | ORAL_TABLET | Freq: Two times a day (BID) | ORAL | 0 refills | Status: DC
Start: 2023-03-06 — End: 2023-04-09

## 2023-03-06 MED ORDER — GUAIFENESIN ER 600 MG PO TB12
1200.0000 mg | ORAL_TABLET | Freq: Two times a day (BID) | ORAL | 1 refills | Status: DC
Start: 2023-03-06 — End: 2024-02-18

## 2023-03-06 MED ORDER — BENZONATATE 200 MG PO CAPS
200.0000 mg | ORAL_CAPSULE | Freq: Two times a day (BID) | ORAL | 0 refills | Status: DC | PRN
Start: 2023-03-06 — End: 2023-04-09

## 2023-03-06 NOTE — Progress Notes (Signed)
Acute Office Visit  Subjective:     Patient ID: Chenay Garfias, female    DOB: 27-Feb-1951, 72 y.o.   MRN: [id number]  Chief Complaint  Patient presents with   Cough   Sore Throat    Cough  Sore Throat  Associated symptoms include coughing.    Patient is in today for cough and throat that sore throat that started over 2 weeks ago when she visited grandchildren. Patient reports grandchildren had "a really bad cold" and states that her symptoms started 4 days after returning home from visit.   Patient reports cough, sore throat, scratchy throat, "more effort when swallowing", no fevers, body aches are constant, ears "popping" and feel full. Patient states she drove to [place], but says ears have been bothering her for a while prior to traveling. Patient also reports headache and states that she takes ibuprofen daily, taking 1-2 tablets every morning for her headache. Patient is on Breztri, using 2 puffs twice a day. Patient states inhaler typically "takes the edge off" but says her cough has worsened with inhaler and that "it hasn't helped this time." Patient denies hx of asthma; states she has seasonal environmental allergies.   Review of Systems  Respiratory:  Positive for cough.    *All body systems negative except for what is specified below.*     Objective:    There were no vitals taken for this visit.   Physical Exam Constitutional:      Appearance: She is well-developed and normal weight.  HENT:     Head: Normocephalic.     Right Ear: Hearing, tympanic membrane, ear canal and external ear normal.     Left Ear: Hearing, tympanic membrane, ear canal and external ear normal.     Nose:     Right Sinus: Frontal sinus tenderness present.     Left Sinus: Frontal sinus tenderness present.     Comments: Patient appears not to feel well.    Mouth/Throat:     Mouth: Mucous membranes are moist.     Pharynx: Uvula midline. No oropharyngeal exudate.     Tonsils:  No tonsillar exudate or tonsillar abscesses.  Eyes:     Conjunctiva/sclera: Conjunctivae normal.  Cardiovascular:     Rate and Rhythm: Normal rate.     Heart sounds: Normal heart sounds.  Pulmonary:     Effort: Pulmonary effort is normal.     Breath sounds: Normal breath sounds.  Neurological:     General: No focal deficit present.     Mental Status: She is alert and oriented to person, place, and time. Mental status is at baseline.     No results found for any visits on 03/06/23.      Assessment & Plan:   Treatment for upper respiratory infection and sinusitis:   **Many medications will have more than one ingredient, be sure you are reading the packaging carefully and not taking more than one dose of the same kind of medication at the same time or too close together. It is OK to use formulas that have all of the ingredients you want, but do not take them in a combined medication and as separate dose too close together. If you have any questions, the pharmacist will be happy to help you decide what is safe.  If you have allergies, you may also consider taking an oral antihistamine (like Zyrtec or Claritin) as these may also help with your symptoms.  Aches/Pains, Fever, Headache  OTC Acetaminophen (Tylenol) 500  mg tablets - take max 2 tablets (1000 mg) every 6 hours (4 times per day)   OTC Ibuprofen (Motrin) 200 mg tablets - take max 4 tablets (800 mg) every 6 hours   Sinus Congestion           Nasal Stuffiness: Saline nasal spray and/or Nettie Pot with sterile saline solution  Pain/Pressure: Warm washcloth to the face   Sore Throat: Warm salt water gargles  Cough & Sore Throat  Prescription cough pills or syrups as directed  OTC Dextromethorphan (Robitussin, others) - cough suppressant  OTC Guaifenesin (Robitussin, Mucinex, others) - expectorant (helps cough up mucus) (Dextromethorphan and Guaifenesin also come in a combination tablet/syrup)  OTC Lozenges w/  Benzocaine + Menthol (Cepacol)  Honey - as much as you want!  Teas which "coat the throat" - look for ingredients Elm Bark, Licorice Root, Marshmallow Root    Prescription Antibiotics if these are necessary for bacterial infection - take ALL, even if you're feeling better   OTC Zinc Lozenges within 24 hours of symptoms onset - mixed evidence this shortens the duration of the common cold Don't waste your money on Vitamin C or Echinacea in acute illness - these are great for prevention, but once the illness has set in, it's too late!    Return to office for reevaluation if symptoms do not improve after completion of antibiotic.  Luisa Hart, RN

## 2023-03-06 NOTE — Patient Instructions (Signed)
The following information is provided as a general resource for ADULT patients only and does NOT take into account PREGNANCY, ALLERGIES, LIVER CONDITIONS, KIDNEY CONDITIONS, GASTROINTESTINAL CONDITIONS, OR PRESCRIPTION MEDICATION INTERACTIONS. Please be sure to ask your provider if the following are safe to take with your specific medical history, conditions, or current medication regimen if you are unsure.   Adult Basic Symptom Management for Sinusitis  Congestion: Guaifenesin (Mucinex)- follow directions on packaging with a maximum dose of 2400mg in a 24 hour period.  Pain/Fever: Ibuprofen 200mg - 400mg every 4-6 hours as needed (MAX 1200mg in a 24 hour period) Pain/Fever: Tylenol 500mg -1000mg every 6-8 hours as needed (MAX 3000mg in a 24 hour period)  Cough: Dextromethorphan (Delsym)- follow directions on packing with a maximum dose of 120mg in a 24 hour period.  Nasal Stuffiness: Saline nasal spray and/or Nettie Pot with sterile saline solution  Runny Nose: Fluticasone nasal spray (Flonase) OR Mometasone nasal spray (Nasonex) OR Triamcinolone Acetonide nasal spray (Nasacort)- follow directions on the packaging  Pain/Pressure: Warm washcloth to the face  Sore Throat: Warm salt water gargles  If you have allergies, you may also consider taking an oral antihistamine (like Zyrtec or Claritin) as these may also help with your symptoms.  **Many medications will have more than one ingredient, be sure you are reading the packaging carefully and not taking more than one dose of the same kind of medication at the same time or too close together. It is OK to use formulas that have all of the ingredients you want, but do not take them in a combined medication and as separate dose too close together. If you have any questions, the pharmacist will be happy to help you decide what is safe.    

## 2023-03-06 NOTE — Progress Notes (Signed)
Acute Office Visit  Subjective:     Patient ID: Brittany Salinas, female    DOB: February 03, 1951, 72 y.o.   MRN: [id number]  Chief Complaint  Patient presents with   Cough   Sore Throat    HPI Patient is in today for URI.   Discussed the use of AI scribe software for clinical note transcription with the patient, who gave verbal consent to proceed.  History of Present Illness   The patient, with a history of chronic cough under the care of pulmonology, presents with worsening of their cough and new onset of upper respiratory symptoms for the past two weeks. The onset of symptoms coincided with a visit to their grandchildren, who were reportedly ill at the time.  The patient's symptoms include a sore throat, nasal congestion with alternating runny and blocked nose, and postnasal drip with thick mucus. They also report frontal headaches, particularly in the mornings, and ear pressure. The patient has experienced some shortness of breath but denies any fevers. However, they report feeling generally unwell, with fatigue and body aches.  The patient's cough has worsened to the point of disturbing their sleep, requiring them to take something at night for relief. They have taken an antibiotic in the past few months, but the details are unclear. The patient denies any known allergies to antibiotics.  The patient also reports ear popping, which was particularly noticeable during a recent trip to [place], but states this is a frequent occurrence. They have used nasal sprays such as Flonase in the past. The patient denies any tenderness in the lymph nodes but reports pain during swallowing.             ROS All review of systems negative except what is listed in the HPI      Objective:    BP 137/66   Pulse 60   Temp 98.1 F (36.7 C) (Oral)   Ht 5\' 4"  (1.626 m)   Wt 156 lb (70.8 kg)   SpO2 100%   BMI 26.78 kg/m    Physical Exam Vitals reviewed.  Constitutional:       Appearance: Normal appearance.  HENT:     Head: Normocephalic and atraumatic.     Right Ear: Tympanic membrane normal.     Left Ear: Tympanic membrane normal.     Nose: Congestion and rhinorrhea present.     Mouth/Throat:     Pharynx: Oropharynx is clear. No oropharyngeal exudate or posterior oropharyngeal erythema.     Tonsils: No tonsillar exudate or tonsillar abscesses.  Eyes:     Conjunctiva/sclera: Conjunctivae normal.  Cardiovascular:     Rate and Rhythm: Normal rate and regular rhythm.     Heart sounds: Normal heart sounds.  Pulmonary:     Effort: Pulmonary effort is normal.     Breath sounds: Normal breath sounds.  Skin:    General: Skin is warm and dry.  Neurological:     Mental Status: She is alert and oriented to person, place, and time.  Psychiatric:        Mood and Affect: Mood normal.        Behavior: Behavior normal.        Thought Content: Thought content normal.        Judgment: Judgment normal.     No results found for any visits on 03/06/23.      Assessment & Plan:   Problem List Items Addressed This Visit   None Visit Diagnoses  Upper respiratory tract infection, unspecified type    -  Primary   Relevant Medications   benzonatate (TESSALON) 200 MG capsule   guaiFENesin (MUCINEX) 600 MG 12 hr tablet   amoxicillin-clavulanate (AUGMENTIN) 875-125 MG tablet   Acute non-recurrent frontal sinusitis       Relevant Medications   benzonatate (TESSALON) 200 MG capsule   guaiFENesin (MUCINEX) 600 MG 12 hr tablet   amoxicillin-clavulanate (AUGMENTIN) 875-125 MG tablet      Symptoms of worsening cough, sore throat, nasal congestion, postnasal drainage, ear pressure, and fatigue for two weeks following exposure to sick family members. No fever or hemoptysis. Stable vitals.  -Start Augmentin for 10 days. -Use Tessalon cough pearls. -Use Mucinex BID -Consider using Flonase or similar nasal spray for congestion. -Drink plenty of fluids, rest, and use a  humidifier. -Check progress after the weekend and contact the office if no improvement.        Meds ordered this encounter  Medications   benzonatate (TESSALON) 200 MG capsule    Sig: Take 1 capsule (200 mg total) by mouth 2 (two) times daily as needed for cough.    Dispense:  20 capsule    Refill:  0    Order Specific Question:   Supervising Provider    Answer:   Danise Edge A [[id number]]   guaiFENesin (MUCINEX) 600 MG 12 hr tablet    Sig: Take 2 tablets (1,200 mg total) by mouth 2 (two) times daily.    Dispense:  30 tablet    Refill:  1    Order Specific Question:   Supervising Provider    Answer:   Danise Edge A [[id number]]   amoxicillin-clavulanate (AUGMENTIN) 875-125 MG tablet    Sig: Take 1 tablet by mouth 2 (two) times daily.    Dispense:  20 tablet    Refill:  0    Order Specific Question:   Supervising Provider    Answer:   Danise Edge A [[id number]]    Return if symptoms worsen or fail to improve.  Clayborne Dana, NP

## 2023-04-09 ENCOUNTER — Encounter: Payer: Self-pay | Admitting: Pulmonary Disease

## 2023-04-09 ENCOUNTER — Ambulatory Visit: Payer: PPO | Admitting: Pulmonary Disease

## 2023-04-09 VITALS — BP 122/72 | HR 63 | Temp 98.2°F | Ht 64.0 in | Wt 158.2 lb

## 2023-04-09 DIAGNOSIS — R070 Pain in throat: Secondary | ICD-10-CM

## 2023-04-09 DIAGNOSIS — R053 Chronic cough: Secondary | ICD-10-CM

## 2023-04-09 MED ORDER — STIOLTO RESPIMAT 2.5-2.5 MCG/ACT IN AERS: 2.0000 | INHALATION_SPRAY | Freq: Every day | RESPIRATORY_TRACT | 3 refills | Status: DC

## 2023-04-09 NOTE — Progress Notes (Signed)
@Patient  ID: Brittany Salinas, female    DOB: 1950-11-16, 72 y.o.   MRN: [id number]  Chief Complaint  Patient presents with   Follow-up    Cough is better.  Doing well.  Insurance still not paying for [employer].  Using samples of Breztri.    Referring provider: Bradd Canary, MD  HPI:   72 y.o. woman whom we are seeing for evaluation of chronic cough.  Multiple notes  reviewed.  Returns for routine follow-up.  Some concern for cough variant asthma given possible triggered by viral infections, bronchitis in the past.  Dulera with improvement.  Unable to get the insurance.  Breztri via samples.  Reports hoarse voice or voice fatigue after talking for longer period of time.  Overall cough much improved with inhaler therapy, Breztri.  She has some throat fullness.  Better since they started.  Nuys any reflux symptoms.  HPI at initial visit: Patient notes no history of cough.  No diagnosis of asthma or other breathing issues.  Developed COVID in 2021.  Subsequent infection sounds like in 2022.  Since 2021, she has had a cough.  Certainly be acute symptoms, common viral symptoms all got better.  Overall frequency and severity of cough has improved.  However she has a lingering chronic dry cough.  Maybe a bit more noticeable in the evening per the husband.  No position that make things better or worse.  No seasonal or environmental factors she can identify to make things better or worse.  Breathing exercises seem to make it better.  Long conversations and singing seem to make it worse.  No other alleviating or exacerbating factors.  Never used an inhaler.  Denies any exercise intolerance or significant dyspnea on exertion.  No GERD.  Occasional runny nose, seasonal allergies.  But cough does not worsen or improve related to the severity of the nasal symptoms.  She has a never smoker.  Chest x-ray, most recent chest imaging, 10/2022 with clear lungs and mild hyperinflation on my review and  interpretation.  Similar findings noted 09/22/2021 and 03/25/2021 on my review and interpretation.    Questionaires / Pulmonary Flowsheets:   ACT:      No data to display          MMRC:     No data to display          Epworth:     01/23/2023    8:00 AM  Results of the Epworth flowsheet  Sitting and reading 3  Watching TV 1  Sitting, inactive in a public place (e.g. a theatre or a meeting) 0  As a passenger in a car for an hour without a break 1  Lying down to rest in the afternoon when circumstances permit 3  Sitting and talking to someone 0  Sitting quietly after a lunch without alcohol 2  In a car, while stopped for a few minutes in traffic 0  Total score 10    Tests:   FENO:  No results found for: "NITRICOXIDE"  PFT:     No data to display          WALK:      No data to display          Imaging: Personally reviewed and as per EMR and discussion in this note No results found.  Lab Results: Personally reviewed CBC    Component Value Date/Time   WBC 7.1 01/21/2023 1146   RBC 4.46 01/21/2023 1146   HGB  13.5 01/21/2023 1146   HCT 41.1 01/21/2023 1146   PLT 216.0 01/21/2023 1146   MCV 92.2 01/21/2023 1146   MCH 30.0 02/12/2020 0649   MCHC 32.8 01/21/2023 1146   RDW 12.9 01/21/2023 1146   LYMPHSABS 2.2 01/21/2023 1146   MONOABS 0.4 01/21/2023 1146   EOSABS 0.1 01/21/2023 1146   BASOSABS 0.1 01/21/2023 1146    BMET    Component Value Date/Time   NA 142 01/21/2023 1146   K 4.3 01/21/2023 1146   CL 104 01/21/2023 1146   CO2 29 01/21/2023 1146   GLUCOSE 76 01/21/2023 1146   BUN 15 01/21/2023 1146   CREATININE 0.72 01/21/2023 1146   CALCIUM 9.5 01/21/2023 1146   GFRNONAA >60 02/12/2020 0649    BNP No results found for: "BNP"  ProBNP No results found for: "PROBNP"  Specialty Problems       Pulmonary Problems   Cough    Allergies  Allergen Reactions   Codeine Nausea And Vomiting   Crestor [Rosuvastatin]     Muscle  aches    Immunization History  Administered Date(s) Administered   Fluad Quad(high Dose 65+) 01/26/2019   PFIZER Comirnaty(Gray Top)Covid-19 Tri-Sucrose Vaccine 07/30/2020   PFIZER(Purple Top)SARS-COV-2 Vaccination 06/26/2019, 07/19/2019   Zoster Recombinant(Shingrix) 01/21/2023    Past Medical History:  Diagnosis Date   Blood transfusion without reported diagnosis 1997   Complications c-section   Cataracts, both eyes    Closed fracture of fifth metatarsal bone 12/14/2021   Closed fracture of fourth metatarsal bone 12/14/2021   Colon polyp 10/09/2017   Dyspareunia in female 11/16/2018   Family history of adverse reaction to anesthesia    mother and sister - slow to awaken   Gait disturbance 04/18/2016   Generalized anxiety disorder 09/17/2022   History of COVID-19 03/16/2021   Hyperlipidemia    Hypertension    Insomnia    Left elbow fracture 02/12/2020   Left knee pain 05/13/2017   Lumbar radiculopathy 01/22/2022   Lumbar spondylosis 03/05/2022   Major depressive disorder 04/18/2016   Obesity 04/13/2018   Osteoarthritis    Osteoporosis 01/21/2023   Pain in joint of right hip 01/04/2022   Right foot pain 10/09/2017   Subjective memory complaints 04/18/2016   Vaginitis 04/13/2018   Vertigo 01/29/2019   Vision abnormalities     Tobacco History: Social History   Tobacco Use  Smoking Status Never  Smokeless Tobacco Never   Counseling given: Not Answered   Continue to not smoke  Outpatient Encounter Medications as of 04/09/2023  Medication Sig   Ascorbic Acid (VITAMIN C) 1000 MG tablet Take 1,000 mg by mouth daily.   aspirin EC 81 MG tablet Take 81 mg by mouth daily. Swallow whole.   Azelastine-Fluticasone 137-50 MCG/ACT SUSP Place 1 spray into the nose every 12 (twelve) hours.   b complex vitamins capsule Take 1 capsule by mouth daily.   buPROPion (WELLBUTRIN XL) 300 MG 24 hr tablet TAKE 1 TABLET BY MOUTH EVERY DAY   guaiFENesin (MUCINEX) 600 MG 12 hr tablet  Take 2 tablets (1,200 mg total) by mouth 2 (two) times daily.   Krill Oil 500 MG CAPS Take 1,200 mg by mouth daily.   magnesium oxide (MAG-OX) 400 MG tablet Take 400 mg by mouth at bedtime.   meclizine (ANTIVERT) 12.5 MG tablet Take 1 tablet (12.5 mg total) by mouth 3 (three) times daily as needed for dizziness.   Melatonin 10 MG TABS Take 5 mg by mouth at bedtime.   niacin  500 MG tablet Take 500 mg by mouth daily.   Tiotropium Bromide-Olodaterol (STIOLTO RESPIMAT) 2.5-2.5 MCG/ACT AERS Inhale 2 puffs into the lungs daily.   TURMERIC CURCUMIN PO Take 750 mg by mouth daily.   VITAMIN D-VITAMIN K PO Take 250 mcg by mouth daily.   [DISCONTINUED] Budeson-Glycopyrrol-Formoterol (BREZTRI AEROSPHERE) 160-9-4.8 MCG/ACT AERO Inhale 2 puffs into the lungs 2 (two) times daily.   mometasone-formoterol (DULERA) 200-5 MCG/ACT AERO Inhale 2 puffs into the lungs 2 (two) times daily. (Patient not taking: Reported on 04/09/2023)   [DISCONTINUED] amoxicillin-clavulanate (AUGMENTIN) 875-125 MG tablet Take 1 tablet by mouth 2 (two) times daily. (Patient not taking: Reported on 04/09/2023)   [DISCONTINUED] benzonatate (TESSALON) 200 MG capsule Take 1 capsule (200 mg total) by mouth 2 (two) times daily as needed for cough. (Patient not taking: Reported on 04/09/2023)   No facility-administered encounter medications on file as of 04/09/2023.     Review of Systems  Review of Systems  N/a Physical Exam  BP 122/72 (BP Location: Left Arm, Patient Position: Sitting, Cuff Size: Normal)   Pulse 63   Temp 98.2 F (36.8 C) (Oral)   Ht 5\' 4"  (1.626 m)   Wt 158 lb 3.2 oz (71.8 kg)   SpO2 100%   BMI 27.15 kg/m   Wt Readings from Last 5 Encounters:  04/09/23 158 lb 3.2 oz (71.8 kg)  03/06/23 156 lb (70.8 kg)  01/23/23 157 lb 3.2 oz (71.3 kg)  01/21/23 158 lb 9.6 oz (71.9 kg)  10/11/22 162 lb (73.5 kg)    BMI Readings from Last 5 Encounters:  04/09/23 27.15 kg/m  03/06/23 26.78 kg/m  01/23/23 26.98 kg/m   01/21/23 27.22 kg/m  10/11/22 27.81 kg/m     Physical Exam General: Sitting in chair, no acute distress Eyes: EOMI, no icterus Neck: Supple, no JVP Pulmonary: Clear, good air excursion, normal work of breathing Cardiovascular: Warm, no edema Abdomen: Nondistended bowel sounds present MSK: No synovitis, no joint effusion Neuro: Normal gait, no weakness   Assessment & Plan:   Chronic cough: Present for 3 years.  Seems triggered by viral infection.  High suspicion for postviral syndrome and with length of symptoms development of asthma.  Mild hyperinflation on chest x-ray 10/2022.  Deny significant postnasal drip or GERD symptoms.  Avoiding DPI.  Much improved with Breztri.  Voice hoarseness as below de-escalate to Stiolto.  New prescription today.  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  Stopped Breztri, start [employer] as above.  Throat discomfort: Referral to ENT specialist sent today.  Brother with history of oropharyngeal cancer per her report.   Return in about 3 months (around 07/08/2023) for f/u Dr. Judeth Horn.   Karren Burly, MD 04/09/2023

## 2023-04-09 NOTE — Patient Instructions (Signed)
Stop Breztri - suspect this is causing hoarseness with inhaled steroid  Start Stiolto 2 puffs once a day  I sent a referral to ENT doctors  Return to clinic in 3 months or sooner as needed

## 2023-04-11 ENCOUNTER — Encounter (INDEPENDENT_AMBULATORY_CARE_PROVIDER_SITE_OTHER): Payer: Self-pay | Admitting: Otolaryngology

## 2023-04-25 ENCOUNTER — Encounter (INDEPENDENT_AMBULATORY_CARE_PROVIDER_SITE_OTHER): Payer: Self-pay | Admitting: Otolaryngology

## 2023-04-25 ENCOUNTER — Ambulatory Visit (INDEPENDENT_AMBULATORY_CARE_PROVIDER_SITE_OTHER): Payer: PPO | Admitting: Otolaryngology

## 2023-04-25 ENCOUNTER — Other Ambulatory Visit (HOSPITAL_COMMUNITY): Payer: Self-pay | Admitting: *Deleted

## 2023-04-25 VITALS — BP 159/80 | HR 64 | Ht 64.0 in | Wt 155.0 lb

## 2023-04-25 DIAGNOSIS — R0982 Postnasal drip: Secondary | ICD-10-CM

## 2023-04-25 DIAGNOSIS — J343 Hypertrophy of nasal turbinates: Secondary | ICD-10-CM

## 2023-04-25 DIAGNOSIS — R0981 Nasal congestion: Secondary | ICD-10-CM

## 2023-04-25 DIAGNOSIS — J3089 Other allergic rhinitis: Secondary | ICD-10-CM | POA: Diagnosis not present

## 2023-04-25 DIAGNOSIS — J342 Deviated nasal septum: Secondary | ICD-10-CM | POA: Diagnosis not present

## 2023-04-25 DIAGNOSIS — R053 Chronic cough: Secondary | ICD-10-CM | POA: Diagnosis not present

## 2023-04-25 DIAGNOSIS — K219 Gastro-esophageal reflux disease without esophagitis: Secondary | ICD-10-CM

## 2023-04-25 DIAGNOSIS — R1310 Dysphagia, unspecified: Secondary | ICD-10-CM

## 2023-04-25 MED ORDER — FAMOTIDINE 20 MG PO TABS: 20.0000 mg | ORAL_TABLET | Freq: Two times a day (BID) | ORAL | 3 refills | Status: DC

## 2023-04-25 MED ORDER — FLUTICASONE PROPIONATE 50 MCG/ACT NA SUSP: 2.0000 | Freq: Two times a day (BID) | NASAL | 6 refills | Status: DC

## 2023-04-25 MED ORDER — CETIRIZINE HCL 10 MG PO TABS: 10.0000 mg | ORAL_TABLET | Freq: Every day | ORAL | 11 refills | Status: DC

## 2023-04-25 NOTE — Patient Instructions (Addendum)
-   Pepcid twice daily  - Reflux Gourmet after meals  - Flonase twice a day  - Zyrtec once a day  - schedule swallow study    [employer]aming[employer] - check out this website to learn more about reflux   -Avoid lying down for at least two hours after a meal or after drinking acidic beverages, like soda, or other caffeinated beverages. This can help to prevent stomach contents from flowing back into the esophagus. -Keep your head elevated while you sleep. Using an extra pillow or two can also help to prevent reflux.  -Eat smaller and more frequent meals each day instead of a few large meals. This promotes digestion and can aid in preventing heartburn. -Wear loose-fitting clothes to ease pressure on the stomach, which can worsen heartburn and reflux. -Reduce excess weight around the midsection. This can ease pressure on the stomach. Such pressure can force some stomach contents back up the esophagus  - Take Reflux Gourmet (natural supplement available on [employer]) to help with symptoms of chronic throat irritation

## 2023-04-25 NOTE — Progress Notes (Unsigned)
ENT CONSULT:  Reason for Consult: chronic cough and throat discomfort x 4 years   HPI: Discussed the use of AI scribe software for clinical note transcription with the patient, who gave verbal consent to proceed.  History of Present Illness   The patient is a 72 yoF who presents with chronic, dry cough persisting for four years. The cough is intermittent, with periods of remission lasting a week or two, but it is not associated with a specific time of day. The patient has been managing the cough with frequent use of cough drops.  She recalls having a COVID infection for the first time and after the infection, she noticed that she developed chronic cough.  The cough was initially managed by a pulmonologist, who prescribed an inhaler. The first inhaler provided some relief, but the improvement was inconsistent. The patient is currently on a second inhaler, a mist-type, taken once daily for the past two weeks.  The patient denies any history of heartburn or reflux and is not currently using a nasal spray for nasal congestion. They report that the cough can be triggered by small amounts of food or liquid during meals. The patient also reports a sensation of tightness in their throat, specifically when swallowing, but denies any difficulty in swallowing or sensation of food getting stuck.  The patient has a history of allergies, for which they received injections as a child when she lived in [place].  She has occasional tinnitus.    Records Reviewed:  Office visit Pulm 04/09/2023 Dr Judeth Horn  72 y.o. woman whom we are seeing for evaluation of chronic cough.  Multiple notes  reviewed.   Returns for routine follow-up.  Some concern for cough variant asthma given possible triggered by viral infections, bronchitis in the past.  Dulera with improvement.  Unable to get the insurance.  Breztri via samples.  Reports hoarse voice or voice fatigue after talking for longer period of time.  Overall cough much  improved with inhaler therapy, Breztri.   She has some throat fullness.  Better since they started.  Nuys any reflux symptoms.   HPI at initial visit: Patient notes no history of cough.  No diagnosis of asthma or other breathing issues.  Developed COVID in 2021.  Subsequent infection sounds like in 2022.  Since 2021, she has had a cough.  Certainly be acute symptoms, common viral symptoms all got better.  Overall frequency and severity of cough has improved.  However she has a lingering chronic dry cough.  Maybe a bit more noticeable in the evening per the husband.  No position that make things better or worse.  No seasonal or environmental factors she can identify to make things better or worse.  Breathing exercises seem to make it better.  Long conversations and singing seem to make it worse.  No other alleviating or exacerbating factors.  Never used an inhaler.   Denies any exercise intolerance or significant dyspnea on exertion.  No GERD.  Occasional runny nose, seasonal allergies.  But cough does not worsen or improve related to the severity of the nasal symptoms.  She has a never smoker.   Chest x-ray, most recent chest imaging, 10/2022 with clear lungs and mild hyperinflation on my review and interpretation.  Similar findings noted 09/22/2021 and 03/25/2021 on my review and interpretation.   Chronic cough: Present for 3 years.  Seems triggered by viral infection.  High suspicion for postviral syndrome and with length of symptoms development of asthma.  Mild  hyperinflation on chest x-ray 10/2022.  Deny significant postnasal drip or GERD symptoms.  Avoiding DPI.  Much improved with Breztri.  Voice hoarseness as below de-escalate to Stiolto.  New prescription today.   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  Stopped Breztri, start [employer] as above.   Throat discomfort: Referral to ENT specialist sent today.  Brother with history of oropharyngeal cancer per her report.      Past  Medical History:  Diagnosis Date   Blood transfusion without reported diagnosis 1997   Complications c-section   Cataracts, both eyes    Closed fracture of fifth metatarsal bone 12/14/2021   Closed fracture of fourth metatarsal bone 12/14/2021   Colon polyp 10/09/2017   Dyspareunia in female 11/16/2018   Family history of adverse reaction to anesthesia    mother and sister - slow to awaken   Gait disturbance 04/18/2016   Generalized anxiety disorder 09/17/2022   History of COVID-19 03/16/2021   Hyperlipidemia    Hypertension    Insomnia    Left elbow fracture 02/12/2020   Left knee pain 05/13/2017   Lumbar radiculopathy 01/22/2022   Lumbar spondylosis 03/05/2022   Major depressive disorder 04/18/2016   Obesity 04/13/2018   Osteoarthritis    Osteoporosis 01/21/2023   Pain in joint of right hip 01/04/2022   Right foot pain 10/09/2017   Subjective memory complaints 04/18/2016   Vaginitis 04/13/2018   Vertigo 01/29/2019   Vision abnormalities     Past Surgical History:  Procedure Laterality Date   BREAST BIOPSY Right    benign   BREAST REDUCTION SURGERY  2007   CESAREAN SECTION     4 c-sections   COLONOSCOPY  2008   COLONOSCOPY W/ POLYPECTOMY  11/15/2011   Dr. Magda Kiel. [facility]. A single semi-pedunculated non-bleeding polyp of benigh apperance was found in the distal ascending colon. A single-piece polypectomy was performed using hot snare in the distal ascending colon. The polyp was completely removed.   EYE SURGERY Bilateral    catarcts   FRACTURE SURGERY  Oct 2021   Shattered elbow   ORIF ELBOW FRACTURE Left 02/12/2020   Procedure: Left elbow olecranon osteotomy, ulnar nerve release, and surgical reconstruction distal end of the humerus with repair;  Surgeon: Dominica Severin, MD;  Location: [facility] OR;  Service: Orthopedics;  Laterality: Left;  2.5 hrs   REDUCTION MAMMAPLASTY Bilateral    ROTATOR CUFF REPAIR Bilateral    right in 2010, left in 2017.    TONSILLECTOMY     TUBAL LIGATION      Family History  Problem Relation Age of Onset   Colon polyps Mother    Heart defect Mother        valvular heart disease   Depression Mother    Memory loss Mother        vascular in nature   Heart attack Father    Hyperlipidemia Father    Hypertension Father    Colon polyps Sister    Marfan syndrome Sister    Other Sister        connective tissue disorder   Hypertension Brother    Other Brother        vertigo   Cancer Maternal Grandmother        stomach   Asthma Maternal Grandfather    Stroke Maternal Grandfather    Marfan syndrome Maternal Grandfather        ?   Alcohol abuse Maternal Grandfather    Psoriasis Daughter  Mental illness Son        depression alcohol in past   Depression Son    Arrhythmia Son    Alcohol abuse Son        h/o drug abuse   Breast cancer Maternal Aunt    Colon cancer Neg Hx    Esophageal cancer Neg Hx    Rectal cancer Neg Hx    Stomach cancer Neg Hx     Social History:  reports that she has never smoked. She has never used smokeless tobacco. She reports current alcohol use of about 2.0 standard drinks of alcohol per week. She reports that she does not use drugs.  Allergies:  Allergies  Allergen Reactions   Codeine Nausea And Vomiting   Crestor [Rosuvastatin]     Muscle aches    Medications: I have reviewed the patient's current medications.  The PMH, PSH, Medications, Allergies, and SH were reviewed and updated.  ROS: Constitutional: Negative for fever, weight loss and weight gain. Cardiovascular: Negative for chest pain and dyspnea on exertion. Respiratory: Is not experiencing shortness of breath at rest. Gastrointestinal: Negative for nausea and vomiting. Neurological: Negative for headaches. Psychiatric: The patient is not nervous/anxious  Blood pressure (!) 159/80, pulse 64, height 5\' 4"  (1.626 m), weight 155 lb (70.3 kg), SpO2 100%.  PHYSICAL EXAM:  Exam: General:  Well-developed, well-nourished Communication and Voice: Poor projection overall Respiratory Respiratory effort: Equal inspiration and expiration without stridor Cardiovascular Peripheral Vascular: Warm extremities with equal color/perfusion Eyes: No nystagmus with equal extraocular motion bilaterally Neuro/Psych/Balance: Patient oriented to person, place, and time; Appropriate mood and affect; Gait is intact with no imbalance; Cranial nerves I-XII are intact Head and Face Inspection: Normocephalic and atraumatic without mass or lesion Palpation: Facial skeleton intact without bony stepoffs Salivary Glands: No mass or tenderness Facial Strength: Facial motility symmetric and full bilaterally ENT Pinna: External ear intact and fully developed External canal: Canal is patent with intact skin Tympanic Membrane: Clear and mobile External Nose: No scar or anatomic deformity Internal Nose: Septum is deviated to the left. No polyp, or purulence. Mucosal edema and erythema present.  Bilateral inferior turbinate hypertrophy.  Lips, Teeth, and gums: Mucosa and teeth intact and viable TMJ: No pain to palpation with full mobility Oral cavity/oropharynx: No erythema or exudate, no lesions present Nasopharynx: No mass or lesion with intact mucosa Hypopharynx: Intact mucosa without pooling of secretions Larynx Glottic: Full true vocal cord mobility without lesion or mass Supraglottic: Normal appearing epiglottis and AE folds Interarytenoid Space: Moderate pachydermia&edema Subglottic Space: Patent without lesion or edema Neck Neck and Trachea: Midline trachea without mass or lesion Thyroid: No mass or nodularity Lymphatics: No lymphadenopathy  Procedure: Preoperative diagnosis: Chronic cough  Postoperative diagnosis:   Same + postnasal drainage + GERD LPR  Procedure: Flexible fiberoptic laryngoscopy  Surgeon: Ashok Croon, MD  Anesthesia: Topical lidocaine and Afrin Complications:  None Condition is stable throughout exam  Indications and consent:  The patient presents to the clinic with Indirect laryngoscopy view was incomplete. Thus it was recommended that they undergo a flexible fiberoptic laryngoscopy. All of the risks, benefits, and potential complications were reviewed with the patient preoperatively and verbal informed consent was obtained.  Procedure: The patient was seated upright in the clinic. Topical lidocaine and Afrin were applied to the nasal cavity. After adequate anesthesia had occurred, I then proceeded to pass the flexible telescope into the nasal cavity. The nasal cavity was patent without rhinorrhea or polyp. The nasopharynx was also patent  without mass or lesion. The base of tongue was visualized and was normal. There were no signs of pooling of secretions in the piriform sinuses. The true vocal folds were mobile bilaterally. There were no signs of glottic or supraglottic mucosal lesion or mass. There was moderate interarytenoid pachydermia and post cricoid edema. The telescope was then slowly withdrawn and the patient tolerated the procedure throughout.    PROCEDURE NOTE: nasal endoscopy  Preoperative diagnosis: chronic nasal congestion and cough symptoms  Postoperative diagnosis: same  Procedure: Diagnostic nasal endoscopy (16109)  Surgeon: Ashok Croon, M.D.  Anesthesia: Topical lidocaine and Afrin  H&P REVIEW: The patient's history and physical were reviewed today prior to procedure. All medications were reviewed and updated as well. Complications: None Condition is stable throughout exam Indications and consent: The patient presents with symptoms of chronic sinusitis not responding to previous therapies. All the risks, benefits, and potential complications were reviewed with the patient preoperatively and informed consent was obtained. The time out was completed with confirmation of the correct procedure.   Procedure: The patient was  seated upright in the clinic. Topical lidocaine and Afrin were applied to the nasal cavity. After adequate anesthesia had occurred, the rigid nasal endoscope was passed into the nasal cavity. The nasal mucosa, turbinates, septum, and sinus drainage pathways were visualized bilaterally. This revealed no purulence or significant secretions that might be cultured. There were no polyps or sites of significant inflammation. The mucosa was intact and there was no crusting present. The scope was then slowly withdrawn and the patient tolerated the procedure well. There were no complications or blood loss.     Studies Reviewed: CXR 10/08/22 CLINICAL DATA:  Chronic couch 1.5 years following COVID   EXAM: CHEST - 2 VIEW   COMPARISON:  Oct 02, 2021   FINDINGS: The cardiomediastinal silhouette is unchanged in contour. No pleural effusion. No pneumothorax. No acute pleuroparenchymal abnormality. Visualized abdomen is unremarkable. No significant degenerative changes of the thoracic spine.   IMPRESSION: No acute cardiopulmonary abnormality.  Pulmonary office visit 01/23/23 Chronic cough: Present for 3 years.  Seems triggered by viral infection.  High suspicion for postviral syndrome and with length of symptoms development of asthma.  Mild hyperinflation on chest x-ray 10/2022.  Deny significant postnasal drip or GERD symptoms.  New prescription high-dose Dulera 2 puff twice daily.  Assess response to ICS/LABA therapy.  Consider empiric PPI therapy despite lack of GERD symptoms given significant portion of cough related to GERD is asymptomatic.  Assessment/Plan: Encounter Diagnoses  Name Primary?   Chronic cough Yes   Dysphagia, unspecified type    Chronic GERD    Chronic nasal congestion    Post-nasal drip    Environmental and seasonal allergies    Nasal septal deviation    Hypertrophy of both inferior nasal turbinates     Assessment and Plan    Chronic Cough x 3-4 yrs Persistent dry cough for  four years, post-COVID onset.  Previously seen and evaluated by pulmonary Dr. Judeth Horn, with a diagnosis of postviral syndrome cough and possible asthma on Dulera twice daily. No heartburn or reflux symptoms. Cough is worse in the morning and evening.  Flexible laryngoscopy showed mild vocal fold atrophy and minimal edema, and moderate postcricoid edema pachydermia consistent with GERD LPR and postnasal drainage. Differential diagnosis includes postnasal drainage, untreated reflux, as potential causes of her cough and neurogenic cough following COVID-19 infection. Discussed potential causes and treatment options, including postnasal drainage management, reflux treatment, and neurogenic cough management. If  these treatments fail, neurogenic cough might be considered, with options like gabapentin, amitriptyline, tramadol, or superior laryngeal nerve block. - Prescribed Pepcid 20 mg BID for potential reflux management. - Recommended dietary modifications to reduce reflux risk/trial of reflux Gourmet - Prescribed Zyrtec 10 mg QD for postnasal drainage. - Prescribed Flonase 2 puffs bilateral nares BID for postnasal drainage. - Encourage keeping a daily cough diary to track symptoms. - Follow up in two months to reassess symptoms.  Chronic nasal congestion postnasal drainage/history reof environmental allergies and allergy shots received in [place] as a child Clear postnasal drainage observed during bilateral nasal endoscopy. No signs of sinus infection, pus, or polyps.  She also had evidence of left septal deviation and mild inferior turbinate hypertrophy . - Prescribe Zyrtec 10 mg QD. - Prescribe Flonase 2 puffs bilateral nares BID.  GERD LPR Possible reflux contributing to throat irritation and chronic cough. Discussed management with medications and dietary changes. - Prescribed Pepcid 20 mg BID. - Provide dietary recommendations to reduce reflux risk. - Recommend reading materials on reflux  management. -Will consider GI referral in the future for EGD  Dysphagia and cough when eating certain foods, suspected GERD -MBS and esophagram to further evaluate -Will consider GI referral in the future for EGD  Septal Deviation and inferior turbinate hypertrophy Both noted on nasal endoscopy today -Medical management of chronic nasal congestion as above - Monitor symptoms and consider septoplasty inferior turbinate reduction in the future  Follow-up - Follow up in two months. - Call the office if symptoms change before the scheduled appointment.        Thank you for allowing me to participate in the care of this patient. Please do not hesitate to contact me with any questions or concerns.   Ashok Croon, MD Otolaryngology [facility] Phone: [phone] Fax: [phone]    04/26/2023, 9:55 AM

## 2023-05-02 ENCOUNTER — Other Ambulatory Visit (INDEPENDENT_AMBULATORY_CARE_PROVIDER_SITE_OTHER): Payer: Self-pay | Admitting: Otolaryngology

## 2023-05-05 ENCOUNTER — Telehealth: Payer: Self-pay | Admitting: Family Medicine

## 2023-05-05 NOTE — Telephone Encounter (Signed)
Copied from CRM [id number]. Topic: Medicare AWV >> May 05, 2023  3:14 PM Payton Doughty wrote: Reason for CRM: Called 05/05/2023 to sched AWV - NO VOICEMAIL  Verlee Rossetti; Care Guide Ambulatory Clinical Support [place] [facility] Direct Dial: [phone]

## 2023-05-14 DIAGNOSIS — J069 Acute upper respiratory infection, unspecified: Secondary | ICD-10-CM | POA: Diagnosis not present

## 2023-05-14 DIAGNOSIS — R519 Headache, unspecified: Secondary | ICD-10-CM | POA: Diagnosis not present

## 2023-05-14 DIAGNOSIS — H109 Unspecified conjunctivitis: Secondary | ICD-10-CM | POA: Diagnosis not present

## 2023-05-14 DIAGNOSIS — R051 Acute cough: Secondary | ICD-10-CM | POA: Diagnosis not present

## 2023-05-28 ENCOUNTER — Ambulatory Visit (HOSPITAL_COMMUNITY)
Admission: RE | Admit: 2023-05-28 | Discharge: 2023-05-28 | Disposition: A | Discharge status: Home / Self Care | Payer: PPO | Source: Ambulatory Visit | Attending: Family Medicine | Admitting: Family Medicine

## 2023-05-28 ENCOUNTER — Ambulatory Visit (HOSPITAL_COMMUNITY): Admission: RE | Admit: 2023-05-28 | Payer: PPO | Source: Ambulatory Visit

## 2023-05-28 ENCOUNTER — Encounter (HOSPITAL_COMMUNITY): Payer: Self-pay

## 2023-05-28 ENCOUNTER — Encounter (HOSPITAL_COMMUNITY): Payer: PPO

## 2023-05-28 DIAGNOSIS — R1310 Dysphagia, unspecified: Secondary | ICD-10-CM

## 2023-06-04 DIAGNOSIS — M654 Radial styloid tenosynovitis [de Quervain]: Secondary | ICD-10-CM | POA: Diagnosis not present

## 2023-06-04 DIAGNOSIS — M13841 Other specified arthritis, right hand: Secondary | ICD-10-CM | POA: Diagnosis not present

## 2023-06-10 DIAGNOSIS — H9203 Otalgia, bilateral: Secondary | ICD-10-CM | POA: Diagnosis not present

## 2023-06-10 DIAGNOSIS — J020 Streptococcal pharyngitis: Secondary | ICD-10-CM | POA: Diagnosis not present

## 2023-06-13 ENCOUNTER — Ambulatory Visit (HOSPITAL_COMMUNITY)
Admission: RE | Admit: 2023-06-13 | Discharge: 2023-06-13 | Disposition: A | Discharge status: Home / Self Care | Payer: PPO | Source: Ambulatory Visit | Attending: Family Medicine | Admitting: Family Medicine

## 2023-06-13 ENCOUNTER — Ambulatory Visit (HOSPITAL_COMMUNITY)
Admission: RE | Admit: 2023-06-13 | Discharge: 2023-06-13 | Disposition: A | Discharge status: Home / Self Care | Payer: PPO | Source: Ambulatory Visit | Attending: Otolaryngology

## 2023-06-13 ENCOUNTER — Ambulatory Visit (HOSPITAL_COMMUNITY)
Admission: RE | Admit: 2023-06-13 | Discharge: 2023-06-13 | Disposition: A | Discharge status: Home / Self Care | Payer: PPO | Source: Ambulatory Visit | Attending: Otolaryngology | Admitting: Otolaryngology

## 2023-06-13 DIAGNOSIS — K449 Diaphragmatic hernia without obstruction or gangrene: Secondary | ICD-10-CM | POA: Insufficient documentation

## 2023-06-13 DIAGNOSIS — R1310 Dysphagia, unspecified: Secondary | ICD-10-CM | POA: Insufficient documentation

## 2023-06-13 DIAGNOSIS — R053 Chronic cough: Secondary | ICD-10-CM | POA: Insufficient documentation

## 2023-06-13 NOTE — Progress Notes (Signed)
 Modified Barium Swallow Study  Patient Details  Name: Brittany Salinas MRN: [id number] Date of Birth: 10/06/1950  Today's Date: 06/13/2023  Modified Barium Swallow completed.  Full report located under Chart Review in the Imaging Section.  History of Present Illness 73 yr old seen for outpatient MBS with history of chronic cough 3-4 years, tightness in throat when swallowing. When asked if she had difficulty/coughing etc swallowing foods or liquids pt denied with this SLP. She under went laryngoscopy with ENT revealing mild vocal fold atrophy and minimal edema, and moderate postcricoid edema pachydermia consistent with GERD LPR and postnasal drainage. Differential diagnosis includes postnasal drainage, untreated reflux, as potential causes of her cough and neurogenic cough following COVID-19 infection. Recommendations were as follows: Prescribed Pepcid  20 mg BID.  - Provide dietary recommendations to reduce reflux risk.  - Recommend reading materials on reflux management.  -Will consider GI referral in the future for EGD .   Clinical Impression Pt demonstrated normal oropharyngeal swallow function. There were slight/subtle cervical osteophytes at C3-5 that did not impede function or flow of barium. Orally she exhibited good bolus control, transit and bolus preparation/mastication without residue. From a pharyngeal standpoint her safety and efficiency of swallow was within normal limits with adequate laryngeal elevation, anterior hyoid excursion, epiglottic deflection and laryngeal vestibular closure. There was no penetration or aspiration present or pharyngeal residue post swallow. Her pharyngoesophageal segment revealed adequate distention and complete duration without obstruction of flow. On one instance there was slightl/trace barium seen well below the PES that was not concerning. The esophagus was not viewed during this exam as pt had barium esophagram prior revealing small hiatal  hernia, otherwise normal esophagram. Barium pill passed normally. Recommend pt continue regular texture, thin liquids, pills with thin. Given she has history of possible GERD, LPR on laryngoscopy therapist provided pt with written handout and verbally reviewed esophageal precautions. No further ST needed. Factors that may increase risk of adverse event in presence of aspiration Noe & Lianne 2021):    Swallow Evaluation Recommendations Recommendations: PO diet PO Diet Recommendation: Regular;Thin liquids (Level 0) Liquid Administration via: Cup;Straw Medication Administration: Whole meds with liquid Supervision: Patient able to self-feed Postural changes: Position pt fully upright for meals;Stay upright 30-60 min after meals Oral care recommendations: Oral care BID (2x/day)      Dustin Olam Bull 06/13/2023,12:45 PM

## 2023-06-23 ENCOUNTER — Encounter: Payer: Self-pay | Admitting: Family Medicine

## 2023-06-23 ENCOUNTER — Other Ambulatory Visit (INDEPENDENT_AMBULATORY_CARE_PROVIDER_SITE_OTHER): Payer: PPO

## 2023-06-23 DIAGNOSIS — E785 Hyperlipidemia, unspecified: Secondary | ICD-10-CM

## 2023-06-23 LAB — LIPID PANEL
Cholesterol: 214 mg/dL — ABNORMAL HIGH (ref 0–200)
HDL: 52.6 mg/dL (ref >39.00)
LDL Cholesterol: 129 mg/dL — ABNORMAL HIGH (ref 0–99)
NonHDL: 161.66
Total CHOL/HDL Ratio: 4
Triglycerides: 161 mg/dL — ABNORMAL HIGH (ref 0.0–149.0)
VLDL: 32.2 mg/dL (ref 0.0–40.0)

## 2023-07-08 ENCOUNTER — Ambulatory Visit: Payer: PPO | Admitting: Pulmonary Disease

## 2023-07-08 ENCOUNTER — Encounter: Payer: Self-pay | Admitting: Pulmonary Disease

## 2023-07-08 VITALS — BP 146/80 | HR 63 | Ht 64.0 in | Wt 158.4 lb

## 2023-07-08 DIAGNOSIS — R053 Chronic cough: Secondary | ICD-10-CM

## 2023-07-08 NOTE — Progress Notes (Signed)
 @Patient  ID: Brittany Salinas, female    DOB: Oct 30, 1950, 73 y.o.   MRN: [id number]  Chief Complaint  Patient presents with   Follow-up    Pt says stiolto inhaler doesn't seem to be helping, cough has improved. Pt is concerned of hoarse voice.     Referring provider: Bradd Canary, MD  HPI:   73 y.o. woman whom we are seeing for evaluation of chronic cough.  Most recent ENT note, speech-language pathology note reviewed..  Returns for routine follow-up.  Seen by ENT in the interim.  Laryngoscopy exam largely normal with some irritation edema.  Felt related possible GERD for the past nasal drip.  Symptoms improving with ENT treatment.  Cough largely gone.  Inhalers nothing to be helping.  Stop Stiolto.  Dulera made things worse.  Breztri initially she reported is helping to my last clinic note.  Now sounds like it was not as helpful and in retrospect.  Regards, improving without inhaler therapy.  With therapy directed by ENT.  No further follow-up needed here.  HPI at initial visit: Patient notes no history of cough.  No diagnosis of asthma or other breathing issues.  Developed COVID in 2021.  Subsequent infection sounds like in 2022.  Since 2021, she has had a cough.  Certainly be acute symptoms, common viral symptoms all got better.  Overall frequency and severity of cough has improved.  However she has a lingering chronic dry cough.  Maybe a bit more noticeable in the evening per the husband.  No position that make things better or worse.  No seasonal or environmental factors she can identify to make things better or worse.  Breathing exercises seem to make it better.  Long conversations and singing seem to make it worse.  No other alleviating or exacerbating factors.  Never used an inhaler.  Denies any exercise intolerance or significant dyspnea on exertion.  No GERD.  Occasional runny nose, seasonal allergies.  But cough does not worsen or improve related to the severity of the  nasal symptoms.  She has a never smoker.  Chest x-ray, most recent chest imaging, 10/2022 with clear lungs and mild hyperinflation on my review and interpretation.  Similar findings noted 09/22/2021 and 03/25/2021 on my review and interpretation.    Questionaires / Pulmonary Flowsheets:   ACT:      No data to display          MMRC:     No data to display          Epworth:     01/23/2023    8:00 AM  Results of the Epworth flowsheet  Sitting and reading 3  Watching TV 1  Sitting, inactive in a public place (e.g. a theatre or a meeting) 0  As a passenger in a car for an hour without a break 1  Lying down to rest in the afternoon when circumstances permit 3  Sitting and talking to someone 0  Sitting quietly after a lunch without alcohol 2  In a car, while stopped for a few minutes in traffic 0  Total score 10    Tests:   FENO:  No results found for: "NITRICOXIDE"  PFT:     No data to display          WALK:      No data to display          Imaging: Personally reviewed and as per EMR and discussion in this note DG SWALLOW FUNC  OP MEDICARE SPEECH PATH Result Date: 06/13/2023 Table formatting from the original result was not included. Modified Barium Swallow Study Patient Details Name: Brittany Salinas MRN: [id number] Date of Birth: 1950/09/17 Today's Date: 06/13/2023 HPI/PMH: HPI: 73 yr old seen for outpatient MBS with history of chronic cough 3-4 years, tightness in throat when swallowing. When asked if she had difficulty/coughing etc swallowing foods or liquids pt denied with this SLP. She under went laryngoscopy with ENT revealing "mild vocal fold atrophy and minimal edema, and moderate postcricoid edema pachydermia consistent with GERD LPR and postnasal drainage. Differential diagnosis includes postnasal drainage, untreated reflux, as potential causes of her cough and neurogenic cough following COVID-19 infection." Recommendations were as follows:  Prescribed Pepcid 20 mg BID.  - Provide dietary recommendations to reduce reflux risk.  - Recommend reading materials on reflux management.  -Will consider GI referral in the future for EGD . Clinical Impression: Clinical Impression: Pt demonstrated normal oropharyngeal swallow function. There were slight/subtle cervical osteophytes at C3-5 that did not impede function or flow of barium. Orally she exhibited good bolus control, transit and bolus preparation/mastication without residue. From a pharyngeal standpoint her safety and efficiency of swallow was within normal limits with adequate laryngeal elevation, anterior hyoid excursion, epiglottic deflection and laryngeal vestibular closure. There was no penetration or aspiration present or pharyngeal residue post swallow. Her pharyngoesophageal segment revealed adequate distention and complete duration without obstruction of flow. On one instance there was slightl/trace barium seen well below the PES that was not concerning. The esophagus was not viewed during this exam as pt had barium esophagram prior revealing "small hiatal hernia, otherwise normal esophagram. Barium pill "passed normally." Recommend pt continue regular texture, thin liquids, pills with thin. Given she has history of possible GERD, LPR on laryngoscopy therapist provided pt with written handout and verbally reviewed esophageal precautions. No further ST needed. Factors that may increase risk of adverse event in presence of aspiration Rubye Oaks & Clearance Coots 2021): No data recorded Recommendations/Plan: Swallowing Evaluation Recommendations Swallowing Evaluation Recommendations Recommendations: PO diet PO Diet Recommendation: Regular; Thin liquids (Level 0) Liquid Administration via: Cup; Straw Medication Administration: Whole meds with liquid Supervision: Patient able to self-feed Postural changes: Position pt fully upright for meals; Stay upright 30-60 min after meals Oral care recommendations: Oral  care BID (2x/day) Treatment Plan Treatment Plan Treatment recommendations: No treatment recommended at this time Follow-up recommendations: No SLP follow up Recommendations Recommendations for follow up therapy are one component of a multi-disciplinary discharge planning process, led by the attending physician.  Recommendations may be updated based on patient status, additional functional criteria and insurance authorization. Assessment: Orofacial Exam: Orofacial Exam Oral Cavity: Oral Hygiene: WFL Oral Cavity - Dentition: Adequate natural dentition Orofacial Anatomy: WFL Oral Motor/Sensory Function: WFL Anatomy: Anatomy: Suspected cervical osteophytes (minimal that did not impede flow or function) Boluses Administered: Boluses Administered Boluses Administered: Thin liquids (Level 0); Mildly thick liquids (Level 2, nectar thick); Moderately thick liquids (Level 3, honey thick); Puree; Solid  Oral Impairment Domain: Oral Impairment Domain Lip Closure: No labial escape Tongue control during bolus hold: Cohesive bolus between tongue to palatal seal Bolus preparation/mastication: Timely and efficient chewing and mashing Bolus transport/lingual motion: Brisk tongue motion Oral residue: Complete oral clearance Location of oral residue : N/A Initiation of pharyngeal swallow : Valleculae  Pharyngeal Impairment Domain: Pharyngeal Impairment Domain Soft palate elevation: No bolus between soft palate (SP)/pharyngeal wall (PW) Laryngeal elevation: Complete superior movement of thyroid cartilage with complete approximation of arytenoids to epiglottic  petiole Anterior hyoid excursion: Complete anterior movement Epiglottic movement: Complete inversion Laryngeal vestibule closure: Complete, no air/contrast in laryngeal vestibule Pharyngeal stripping wave : Present - complete Pharyngeal contraction (A/P view only): N/A Pharyngoesophageal segment opening: Complete distension and complete duration, no obstruction of flow Tongue base  retraction: No contrast between tongue base and posterior pharyngeal wall (PPW) Pharyngeal residue: Complete pharyngeal clearance Location of pharyngeal residue: N/A  Esophageal Impairment Domain: Esophageal Impairment Domain Esophageal clearance upright position: -- (did not observe esophagus) Pill: No data recorded Penetration/Aspiration Scale Score: Penetration/Aspiration Scale Score 1.  Material does not enter airway: Thin liquids (Level 0); Mildly thick liquids (Level 2, nectar thick); Moderately thick liquids (Level 3, honey thick); Puree; Solid Compensatory Strategies: Compensatory Strategies Compensatory strategies: No   General Information: Caregiver present: No  Diet Prior to this Study: Regular; Thin liquids (Level 0)   No data recorded  Respiratory Status: WFL   Supplemental O2: None (Room air)   History of Recent Intubation: No  Behavior/Cognition: Alert; Cooperative; Pleasant mood Self-Feeding Abilities: Able to self-feed Baseline vocal quality/speech: Normal Volitional Cough: Able to elicit Volitional Swallow: Able to elicit Exam Limitations: No limitations Goal Planning: No data recorded No data recorded No data recorded No data recorded Consulted and agree with results and recommendations: Patient Pain: Pain Assessment Pain Assessment: No/denies pain End of Session: Start Time:SLP Start Time (ACUTE ONLY): 1043 Stop Time: SLP Stop Time (ACUTE ONLY): 1105 Time Calculation:SLP Time Calculation (min) (ACUTE ONLY): 22 min Charges: SLP Evaluations $ SLP Speech Visit: 1 Visit SLP Evaluations $Outpatient MBS Swallow: 1 Procedure SLP visit diagnosis: SLP Visit Diagnosis: Dysphagia, unspecified (R13.10) Past Medical History: Past Medical History: Diagnosis Date  Blood transfusion without reported diagnosis 1997  Complications c-section  Cataracts, both eyes   Closed fracture of fifth metatarsal bone 12/14/2021  Closed fracture of fourth metatarsal bone 12/14/2021  Colon polyp 10/09/2017  Dyspareunia in female  11/16/2018  Family history of adverse reaction to anesthesia   mother and sister - slow to awaken  Gait disturbance 04/18/2016  Generalized anxiety disorder 09/17/2022  History of COVID-19 03/16/2021  Hyperlipidemia   Hypertension   Insomnia   Left elbow fracture 02/12/2020  Left knee pain 05/13/2017  Lumbar radiculopathy 01/22/2022  Lumbar spondylosis 03/05/2022  Major depressive disorder 04/18/2016  Obesity 04/13/2018  Osteoarthritis   Osteoporosis 01/21/2023  Pain in joint of right hip 01/04/2022  Right foot pain 10/09/2017  Subjective memory complaints 04/18/2016  Vaginitis 04/13/2018  Vertigo 01/29/2019  Vision abnormalities  Past Surgical History: Past Surgical History: Procedure Laterality Date  BREAST BIOPSY Right   benign  BREAST REDUCTION SURGERY  2007  CESAREAN SECTION    4 c-sections  COLONOSCOPY  2008  COLONOSCOPY W/ POLYPECTOMY  11/15/2011  Dr. Magda Kiel. [facility]. A single semi-pedunculated non-bleeding polyp of benigh apperance was found in the distal ascending colon. A single-piece polypectomy was performed using hot snare in the distal ascending colon. The polyp was completely removed.  EYE SURGERY Bilateral   catarcts  FRACTURE SURGERY  Oct 2021  Shattered elbow  ORIF ELBOW FRACTURE Left 02/12/2020  Procedure: Left elbow olecranon osteotomy, ulnar nerve release, and surgical reconstruction distal end of the humerus with repair;  Surgeon: Dominica Severin, MD;  Location: [facility] OR;  Service: Orthopedics;  Laterality: Left;  2.5 hrs  REDUCTION MAMMAPLASTY Bilateral   ROTATOR CUFF REPAIR Bilateral   right in 2010, left in 2017.  TONSILLECTOMY    TUBAL LIGATION   Royce Macadamia 06/13/2023, 12:44 PM CLINICAL  DATA:  73 yo female with chronic cough and dysphagia. EXAM: MODIFIED BARIUM SWALLOW TECHNIQUE: Different consistencies of barium were administered orally to the patient by the Speech Pathologist. Imaging of the pharynx was performed in the lateral projection. Video fluoroscopic (cine) images  were acquired. APP was present in the fluoroscopy room during this study, which was supervised and interpreted by Roanna Banning, MD. FLUOROSCOPY: Radiation Exposure Index (as provided by the fluoroscopic device): 4.34 mGy Kerma COMPARISON:  Esophagram, concurrent.  Chest XR, 10/08/2022 FINDINGS: Vestibular  Penetration:  None seen. Aspiration:  None seen. Other:  None. IMPRESSION: Fluoroscopic imaging for modified barium swallow. Normal evaluation. Please refer to the Speech Pathology report for examination details and recommendations. Performed by Buzzy Han, PA-C and supervised by Roanna Banning, MD Electronically Signed   By: Roanna Banning M.D.   On: 06/13/2023 11:41  DG ESOPHAGUS W SINGLE CM (SOL OR THIN BA) Result Date: 06/13/2023 CLINICAL DATA:  73 yo female with chronic cough and dysphagia. EXAM: ESOPHAGUS/BARIUM SWALLOW/TABLET STUDY TECHNIQUE: Combined double and single contrast examination was performed using effervescent crystals, high-density barium, and thin liquid barium. This exam was performed by Buzzy Han, PA-C, and was supervised and interpreted by Dr. Milford Cage. FLUOROSCOPY: Radiation Exposure Index (as provided by the fluoroscopic device): 21.3 mGy Kerma COMPARISON:  Chest XR, 10/08/2022 and 09/05/2021. FINDINGS: Swallowing: Appears normal. No vestibular penetration or aspiration seen. Pharynx: Unremarkable. Esophagus: Normal appearance. Esophageal motility: Within normal limits. Hiatal Hernia: Mild hiatal hernia. Gastroesophageal reflux: None visualized. Ingested 13mm barium tablet: Passed normally Other: Mild GE junction narrowing on upright imaging, not seen on motility study. IMPRESSION: Small hiatal hernia, otherwise normal esophagram. Performed by Buzzy Han, PA-C and supervised by Roanna Banning, MD Electronically Signed   By: Roanna Banning M.D.   On: 06/13/2023 11:39    Lab Results: Personally reviewed CBC    Component Value Date/Time   WBC 7.1 01/21/2023 1146   RBC 4.46  01/21/2023 1146   HGB 13.5 01/21/2023 1146   HCT 41.1 01/21/2023 1146   PLT 216.0 01/21/2023 1146   MCV 92.2 01/21/2023 1146   MCH 30.0 02/12/2020 0649   MCHC 32.8 01/21/2023 1146   RDW 12.9 01/21/2023 1146   LYMPHSABS 2.2 01/21/2023 1146   MONOABS 0.4 01/21/2023 1146   EOSABS 0.1 01/21/2023 1146   BASOSABS 0.1 01/21/2023 1146    BMET    Component Value Date/Time   NA 142 01/21/2023 1146   K 4.3 01/21/2023 1146   CL 104 01/21/2023 1146   CO2 29 01/21/2023 1146   GLUCOSE 76 01/21/2023 1146   BUN 15 01/21/2023 1146   CREATININE 0.72 01/21/2023 1146   CALCIUM 9.5 01/21/2023 1146   GFRNONAA >60 02/12/2020 0649    BNP No results found for: "BNP"  ProBNP No results found for: "PROBNP"  Specialty Problems       Pulmonary Problems   Cough    Allergies  Allergen Reactions   Codeine Nausea And Vomiting   Crestor [Rosuvastatin]     Muscle aches    Immunization History  Administered Date(s) Administered   Fluad Quad(high Dose 65+) 01/26/2019   PFIZER Comirnaty(Gray Top)Covid-19 Tri-Sucrose Vaccine 07/30/2020   PFIZER(Purple Top)SARS-COV-2 Vaccination 06/26/2019, 07/19/2019   Zoster Recombinant(Shingrix) 01/21/2023    Past Medical History:  Diagnosis Date   Blood transfusion without reported diagnosis 1997   Complications c-section   Cataracts, both eyes    Closed fracture of fifth metatarsal bone 12/14/2021   Closed fracture of fourth metatarsal bone 12/14/2021  Colon polyp 10/09/2017   Dyspareunia in female 11/16/2018   Family history of adverse reaction to anesthesia    mother and sister - slow to awaken   Gait disturbance 04/18/2016   Generalized anxiety disorder 09/17/2022   History of COVID-19 03/16/2021   Hyperlipidemia    Hypertension    Insomnia    Left elbow fracture 02/12/2020   Left knee pain 05/13/2017   Lumbar radiculopathy 01/22/2022   Lumbar spondylosis 03/05/2022   Major depressive disorder 04/18/2016   Obesity 04/13/2018    Osteoarthritis    Osteoporosis 01/21/2023   Pain in joint of right hip 01/04/2022   Right foot pain 10/09/2017   Subjective memory complaints 04/18/2016   Vaginitis 04/13/2018   Vertigo 01/29/2019   Vision abnormalities     Tobacco History: Social History   Tobacco Use  Smoking Status Never  Smokeless Tobacco Never   Counseling given: Not Answered   Continue to not smoke  Outpatient Encounter Medications as of 07/08/2023  Medication Sig   Aromatic Inhalants (INHALER DECONGESTANT IN) Inhale into the lungs.   Ascorbic Acid (VITAMIN C) 1000 MG tablet Take 1,000 mg by mouth daily.   aspirin EC 81 MG tablet Take 81 mg by mouth daily. Swallow whole.   b complex vitamins capsule Take 1 capsule by mouth daily.   buPROPion (WELLBUTRIN XL) 300 MG 24 hr tablet TAKE 1 TABLET BY MOUTH EVERY DAY   cetirizine (ZYRTEC) 10 MG tablet Take 1 tablet (10 mg total) by mouth daily.   famotidine (PEPCID) 20 MG tablet TAKE 1 TABLET BY MOUTH TWICE A DAY   fluticasone (FLONASE) 50 MCG/ACT nasal spray Place 2 sprays into both nostrils 2 (two) times daily.   Krill Oil 500 MG CAPS Take 1,200 mg by mouth daily.   magnesium oxide (MAG-OX) 400 MG tablet Take 400 mg by mouth at bedtime.   meclizine (ANTIVERT) 12.5 MG tablet Take 1 tablet (12.5 mg total) by mouth 3 (three) times daily as needed for dizziness.   Melatonin 10 MG TABS Take 5 mg by mouth at bedtime.   metoprolol succinate (TOPROL-XL) 50 MG 24 hr tablet Take 50 mg by mouth daily.   niacin 500 MG tablet Take 500 mg by mouth daily.   TURMERIC CURCUMIN PO Take 750 mg by mouth daily.   VITAMIN D-VITAMIN K PO Take 250 mcg by mouth daily.   guaiFENesin (MUCINEX) 600 MG 12 hr tablet Take 2 tablets (1,200 mg total) by mouth 2 (two) times daily. (Patient not taking: Reported on 07/08/2023)   [DISCONTINUED] mometasone-formoterol (DULERA) 200-5 MCG/ACT AERO Inhale 2 puffs into the lungs 2 (two) times daily. (Patient not taking: Reported on 07/08/2023)    [DISCONTINUED] Tiotropium Bromide-Olodaterol (STIOLTO RESPIMAT) 2.5-2.5 MCG/ACT AERS Inhale 2 puffs into the lungs daily. (Patient not taking: Reported on 07/08/2023)   No facility-administered encounter medications on file as of 07/08/2023.     Review of Systems  Review of Systems  N/a Physical Exam  BP (!) 146/80 (BP Location: Left Arm, Patient Position: Sitting, Cuff Size: Normal)   Pulse 63   Ht 5\' 4"  (1.626 m)   Wt 158 lb 6.4 oz (71.8 kg)   SpO2 98%   BMI 27.19 kg/m   Wt Readings from Last 5 Encounters:  07/08/23 158 lb 6.4 oz (71.8 kg)  04/25/23 155 lb (70.3 kg)  04/09/23 158 lb 3.2 oz (71.8 kg)  03/06/23 156 lb (70.8 kg)  01/23/23 157 lb 3.2 oz (71.3 kg)    BMI Readings  from Last 5 Encounters:  07/08/23 27.19 kg/m  04/25/23 26.61 kg/m  04/09/23 27.15 kg/m  03/06/23 26.78 kg/m  01/23/23 26.98 kg/m     Physical Exam General: Sitting in chair, no acute distress Eyes: EOMI, no icterus Neck: Supple, no JVP Pulmonary: Clear, good air excursion, normal work of breathing Cardiovascular: Warm, no edema Abdomen: Nondistended bowel sounds present MSK: No synovitis, no joint effusion Neuro: Normal gait, no weakness   Assessment & Plan:   Chronic cough: Present for 3 years.  Seems triggered by viral infection.  High suspicion for postviral syndrome and with length of symptoms development of asthma.  Mild hyperinflation on chest x-ray 10/2022.  Deny significant postnasal drip or GERD symptoms.  Worse with Dulera.  Much improved with Breztri.  Inhaler simply stopped.  She feels like Breztri no longer was effective.  We actually switched due to some voice hoarseness.  Regardless improved with ENT therapies concerning for upper airway cough syndrome.  Continue treatment via ENT.  Hoarse voice: Normal ENT exam of vocal cords.  Not improved with stopping ICS therapy.    Return if symptoms worsen or fail to improve.   Karren Burly, MD 07/08/2023

## 2023-07-09 ENCOUNTER — Other Ambulatory Visit: Payer: Self-pay | Admitting: Family Medicine

## 2023-07-18 ENCOUNTER — Other Ambulatory Visit: Payer: Self-pay | Admitting: Family Medicine

## 2023-07-24 ENCOUNTER — Ambulatory Visit (INDEPENDENT_AMBULATORY_CARE_PROVIDER_SITE_OTHER): Payer: PPO | Admitting: Otolaryngology

## 2023-07-24 ENCOUNTER — Encounter (INDEPENDENT_AMBULATORY_CARE_PROVIDER_SITE_OTHER): Payer: Self-pay | Admitting: Otolaryngology

## 2023-07-24 VITALS — BP 157/89 | HR 59 | Ht 63.0 in

## 2023-07-24 DIAGNOSIS — R0982 Postnasal drip: Secondary | ICD-10-CM

## 2023-07-24 DIAGNOSIS — J343 Hypertrophy of nasal turbinates: Secondary | ICD-10-CM

## 2023-07-24 DIAGNOSIS — R053 Chronic cough: Secondary | ICD-10-CM | POA: Diagnosis not present

## 2023-07-24 DIAGNOSIS — K219 Gastro-esophageal reflux disease without esophagitis: Secondary | ICD-10-CM | POA: Diagnosis not present

## 2023-07-24 DIAGNOSIS — R0981 Nasal congestion: Secondary | ICD-10-CM

## 2023-07-24 DIAGNOSIS — J342 Deviated nasal septum: Secondary | ICD-10-CM

## 2023-07-24 DIAGNOSIS — J3089 Other allergic rhinitis: Secondary | ICD-10-CM

## 2023-07-24 MED ORDER — FLUTICASONE PROPIONATE 50 MCG/ACT NA SUSP: 2.0000 | Freq: Two times a day (BID) | NASAL | 6 refills | Status: DC

## 2023-07-24 MED ORDER — FAMOTIDINE 20 MG PO TABS: 20.0000 mg | ORAL_TABLET | Freq: Two times a day (BID) | ORAL | 1 refills | Status: DC

## 2023-07-24 MED ORDER — CETIRIZINE HCL 10 MG PO TABS: 10.0000 mg | ORAL_TABLET | Freq: Every day | ORAL | 11 refills | Status: DC

## 2023-07-24 NOTE — Progress Notes (Signed)
 ENT Progress Note:   Update 07/24/2023  Discussed the use of AI scribe software for clinical note transcription with the patient, who gave verbal consent to proceed.  History of Present Illness   Brittany Salinas is a 73 year old female who presents for f/u 2/2 chronic cough/throat clearing.  She completed swallow testing as planned.  She has experienced a chronic cough that has significantly improved with the use of Pepcid, Zyrtec, and Flonase. She describes the cough/throat clearing are 'much better' since starting these medications, indicating a positive response to treatment.  An esophagram revealed a small hiatal hernia, and MBS showed normal oropharyngeal swallow without aspiration or penetration.  She inquired about the medications she is taking, confirming the use of Pepcid (famotidine), Zyrtec (cetirizine), and Flonase (fluticasone).  Records Reviewed:  Initial Evaluation  Reason for Consult: chronic cough and throat discomfort x 4 years   HPI: Discussed the use of AI scribe software for clinical note transcription with the patient, who gave verbal consent to proceed.  History of Present Illness   The patient is a 23 yoF who presents with chronic, dry cough persisting for four years. The cough is intermittent, with periods of remission lasting a week or two, but it is not associated with a specific time of day. The patient has been managing the cough with frequent use of cough drops.  She recalls having a COVID infection for the first time and after the infection, she noticed that she developed chronic cough.  The cough was initially managed by a pulmonologist, who prescribed an inhaler. The first inhaler provided some relief, but the improvement was inconsistent. The patient is currently on a second inhaler, a mist-type, taken once daily for the past two weeks.  The patient denies any history of heartburn or reflux and is not currently using a nasal spray for nasal  congestion. They report that the cough can be triggered by small amounts of food or liquid during meals. The patient also reports a sensation of tightness in their throat, specifically when swallowing, but denies any difficulty in swallowing or sensation of food getting stuck.  The patient has a history of allergies, for which they received injections as a child when she lived in [place].  She has occasional tinnitus.    Records Reviewed:  Office visit Pulm 04/09/2023 Dr Judeth Horn  73 y.o. woman whom we are seeing for evaluation of chronic cough.  Multiple notes  reviewed.   Returns for routine follow-up.  Some concern for cough variant asthma given possible triggered by viral infections, bronchitis in the past.  Dulera with improvement.  Unable to get the insurance.  Breztri via samples.  Reports hoarse voice or voice fatigue after talking for longer period of time.  Overall cough much improved with inhaler therapy, Breztri.   She has some throat fullness.  Better since they started.  Nuys any reflux symptoms.   HPI at initial visit: Patient notes no history of cough.  No diagnosis of asthma or other breathing issues.  Developed COVID in 2021.  Subsequent infection sounds like in 2022.  Since 2021, she has had a cough.  Certainly be acute symptoms, common viral symptoms all got better.  Overall frequency and severity of cough has improved.  However she has a lingering chronic dry cough.  Maybe a bit more noticeable in the evening per the husband.  No position that make things better or worse.  No seasonal or environmental factors she can identify to make  things better or worse.  Breathing exercises seem to make it better.  Long conversations and singing seem to make it worse.  No other alleviating or exacerbating factors.  Never used an inhaler.   Denies any exercise intolerance or significant dyspnea on exertion.  No GERD.  Occasional runny nose, seasonal allergies.  But cough does not worsen or  improve related to the severity of the nasal symptoms.  She has a never smoker.   Chest x-ray, most recent chest imaging, 10/2022 with clear lungs and mild hyperinflation on my review and interpretation.  Similar findings noted 09/22/2021 and 03/25/2021 on my review and interpretation.   Chronic cough: Present for 3 years.  Seems triggered by viral infection.  High suspicion for postviral syndrome and with length of symptoms development of asthma.  Mild hyperinflation on chest x-ray 10/2022.  Deny significant postnasal drip or GERD symptoms.  Avoiding DPI.  Much improved with Breztri.  Voice hoarseness as below de-escalate to Stiolto.  New prescription today.   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  Stopped Breztri, start [employer] as above.   Throat discomfort: Referral to ENT specialist sent today.  Brother with history of oropharyngeal cancer per her report.      Past Medical History:  Diagnosis Date   Blood transfusion without reported diagnosis 1997   Complications c-section   Cataracts, both eyes    Closed fracture of fifth metatarsal bone 12/14/2021   Closed fracture of fourth metatarsal bone 12/14/2021   Colon polyp 10/09/2017   Dyspareunia in female 11/16/2018   Family history of adverse reaction to anesthesia    mother and sister - slow to awaken   Gait disturbance 04/18/2016   Generalized anxiety disorder 09/17/2022   History of COVID-19 03/16/2021   Hyperlipidemia    Hypertension    Insomnia    Left elbow fracture 02/12/2020   Left knee pain 05/13/2017   Lumbar radiculopathy 01/22/2022   Lumbar spondylosis 03/05/2022   Major depressive disorder 04/18/2016   Obesity 04/13/2018   Osteoarthritis    Osteoporosis 01/21/2023   Pain in joint of right hip 01/04/2022   Right foot pain 10/09/2017   Subjective memory complaints 04/18/2016   Vaginitis 04/13/2018   Vertigo 01/29/2019   Vision abnormalities     Past Surgical History:  Procedure Laterality Date    BREAST BIOPSY Right    benign   BREAST REDUCTION SURGERY  2007   CESAREAN SECTION     4 c-sections   COLONOSCOPY  2008   COLONOSCOPY W/ POLYPECTOMY  11/15/2011   Dr. Magda Kiel. [facility]. A single semi-pedunculated non-bleeding polyp of benigh apperance was found in the distal ascending colon. A single-piece polypectomy was performed using hot snare in the distal ascending colon. The polyp was completely removed.   EYE SURGERY Bilateral    catarcts   FRACTURE SURGERY  Oct 2021   Shattered elbow   ORIF ELBOW FRACTURE Left 02/12/2020   Procedure: Left elbow olecranon osteotomy, ulnar nerve release, and surgical reconstruction distal end of the humerus with repair;  Surgeon: Dominica Severin, MD;  Location: [facility] OR;  Service: Orthopedics;  Laterality: Left;  2.5 hrs   REDUCTION MAMMAPLASTY Bilateral    ROTATOR CUFF REPAIR Bilateral    right in 2010, left in 2017.   TONSILLECTOMY     TUBAL LIGATION      Family History  Problem Relation Age of Onset   Colon polyps Mother    Heart defect Mother  valvular heart disease   Depression Mother    Memory loss Mother        vascular in nature   Heart attack Father    Hyperlipidemia Father    Hypertension Father    Colon polyps Sister    Marfan syndrome Sister    Other Sister        connective tissue disorder   Hypertension Brother    Other Brother        vertigo   Cancer Maternal Grandmother        stomach   Asthma Maternal Grandfather    Stroke Maternal Grandfather    Marfan syndrome Maternal Grandfather        ?   Alcohol abuse Maternal Grandfather    Psoriasis Daughter    Mental illness Son        depression alcohol in past   Depression Son    Arrhythmia Son    Alcohol abuse Son        h/o drug abuse   Breast cancer Maternal Aunt    Colon cancer Neg Hx    Esophageal cancer Neg Hx    Rectal cancer Neg Hx    Stomach cancer Neg Hx     Social History:  reports that she has never smoked. She has never used  smokeless tobacco. She reports current alcohol use of about 2.0 standard drinks of alcohol per week. She reports that she does not use drugs.  Allergies:  Allergies  Allergen Reactions   Codeine Nausea And Vomiting   Crestor [Rosuvastatin]     Muscle aches    Medications: I have reviewed the patient's current medications.  The PMH, PSH, Medications, Allergies, and SH were reviewed and updated.  ROS: Constitutional: Negative for fever, weight loss and weight gain. Cardiovascular: Negative for chest pain and dyspnea on exertion. Respiratory: Is not experiencing shortness of breath at rest. Gastrointestinal: Negative for nausea and vomiting. Neurological: Negative for headaches. Psychiatric: The patient is not nervous/anxious  Blood pressure (!) 157/89, pulse (!) 59, height 5\' 3"  (1.6 m), SpO2 98%.  PHYSICAL EXAM:  Exam: General: Well-developed, well-nourished Respiratory Respiratory effort: Equal inspiration and expiration without stridor Cardiovascular Peripheral Vascular: Warm extremities with equal color/perfusion Eyes: No nystagmus with equal extraocular motion bilaterally Neuro/Psych/Balance: Patient oriented to person, place, and time; Appropriate mood and affect; Gait is intact with no imbalance; Cranial nerves I-XII are intact Head and Face Inspection: Normocephalic and atraumatic without mass or lesion Facial Strength: Facial motility symmetric and full bilaterally ENT Pinna: External ear intact and fully developed External canal: Canal is patent with intact skin External Nose: No scar or anatomic deformity Neck Neck and Trachea: Midline trachea without mass or lesion Thyroid: No mass or nodularity Lymphatics: No lymphadenopathy  Studies Reviewed: CXR 10/08/22 CLINICAL DATA:  Chronic couch 1.5 years following COVID   EXAM: CHEST - 2 VIEW   COMPARISON:  Oct 02, 2021   FINDINGS: The cardiomediastinal silhouette is unchanged in contour. No pleural effusion.  No pneumothorax. No acute pleuroparenchymal abnormality. Visualized abdomen is unremarkable. No significant degenerative changes of the thoracic spine.   IMPRESSION: No acute cardiopulmonary abnormality.  Pulmonary office visit 01/23/23 Chronic cough: Present for 3 years.  Seems triggered by viral infection.  High suspicion for postviral syndrome and with length of symptoms development of asthma.  Mild hyperinflation on chest x-ray 10/2022.  Deny significant postnasal drip or GERD symptoms.  New prescription high-dose Dulera 2 puff twice daily.  Assess response to ICS/LABA therapy.  Consider empiric PPI therapy despite lack of GERD symptoms given significant portion of cough related to GERD is asymptomatic.  Assessment/Plan: Encounter Diagnoses  Name Primary?   Chronic cough Yes   Chronic GERD    Chronic nasal congestion    Post-nasal drip    Environmental and seasonal allergies    Nasal septal deviation    Hypertrophy of both inferior nasal turbinates      Assessment and Plan    Chronic Cough x 3-4 yrs Persistent dry cough for four years, post-COVID onset.  Previously seen and evaluated by pulmonary Dr. Judeth Horn, with a diagnosis of postviral syndrome cough and possible asthma on Dulera twice daily. No heartburn or reflux symptoms. Cough is worse in the morning and evening.  Flexible laryngoscopy showed mild vocal fold atrophy and minimal edema, and moderate postcricoid edema pachydermia consistent with GERD LPR and postnasal drainage. Differential diagnosis includes postnasal drainage, untreated reflux, as potential causes of her cough and neurogenic cough following COVID-19 infection. Discussed potential causes and treatment options, including postnasal drainage management, reflux treatment, and neurogenic cough management. If these treatments fail, neurogenic cough might be considered, with options like gabapentin, amitriptyline, tramadol, or superior laryngeal nerve block. - Prescribed  Pepcid 20 mg BID for potential reflux management. - Recommended dietary modifications to reduce reflux risk/trial of reflux Gourmet - Prescribed Zyrtec 10 mg QD for postnasal drainage. - Prescribed Flonase 2 puffs bilateral nares BID for postnasal drainage. - Encourage keeping a daily cough diary to track symptoms. - Follow up in two months to reassess symptoms.  Chronic nasal congestion postnasal drainage/history reof environmental allergies and allergy shots received in [place] as a child Clear postnasal drainage observed during bilateral nasal endoscopy. No signs of sinus infection, pus, or polyps.  She also had evidence of left septal deviation and mild inferior turbinate hypertrophy . - Prescribe Zyrtec 10 mg QD. - Prescribe Flonase 2 puffs bilateral nares BID.  GERD LPR Possible reflux contributing to throat irritation and chronic cough. Discussed management with medications and dietary changes. - Prescribed Pepcid 20 mg BID. - Provide dietary recommendations to reduce reflux risk. - Recommend reading materials on reflux management. -Will consider GI referral in the future for EGD  Dysphagia and cough when eating certain foods, suspected GERD -MBS and esophagram to further evaluate -Will consider GI referral in the future for EGD  Septal Deviation and inferior turbinate hypertrophy Both noted on nasal endoscopy today -Medical management of chronic nasal congestion as above - Monitor symptoms and consider septoplasty inferior turbinate reduction in the future  Follow-up - Follow up in two months. - Call the office if symptoms change before the scheduled appointment.      Update 07/24/2023 Assessment and Plan    Chronic cough Chronic cough has improved significantly with the current treatment regimen of Pepcid, Zyrtec, and Flonase, indicating likely postnasal drainage and/or silent reflux etiology. Neurogenic cough is unlikely given the positive response to treatment -  Continue Pepcid, Zyrtec, and Flonase - Prescribe refills for Pepcid, Zyrtec, and Flonase - Include information about Albertina Parr cookbooks in the after-visit summary for dietary management of reflux  Chronic nasal congestion postnasal drainage/history reof environmental allergies and allergy shots received in [place] as a child. - continue Zyrtec 10 mg QD. - continue Flonase 2 puffs bilateral nares BID.  GERD LPR Possible reflux contributing to throat irritation and chronic cough. Discussed management with medications and dietary changes. - Prescribed Pepcid 20 mg BID. - Provide dietary recommendations to reduce  reflux risk. - Recommend reading materials on reflux management. -Will consider GI referral in the future for EGD  Dysphagia and cough when eating certain foods, suspected GERD -MBS and esophagram with normal oropharyngeal swallowing without aspiration/penetration  - small hiatal hernia on esophagram  - continue to monitor   Hiatal hernia on esophagram A small hiatal hernia identified on the esophagram may predispose to reflux contributing to the chronic cough, but it is not causing significant issues currently. - Monitor for any changes in symptoms related to reflux      Ashok Croon, MD Otolaryngology [facility] Phone: [phone] Fax: [phone]    07/24/2023, 10:53 AM  -

## 2023-07-24 NOTE — Patient Instructions (Signed)
 [email or web address] - check out this website to learn more about reflux   -Avoid lying down for at least two hours after a meal or after drinking acidic beverages, like soda, or other caffeinated beverages. This can help to prevent stomach contents from flowing back into the esophagus. -Keep your head elevated while you sleep. Using an extra pillow or two can also help to prevent reflux. -Eat smaller and more frequent meals each day instead of a few large meals. This promotes digestion and can aid in preventing heartburn. -Wear loose-fitting clothes to ease pressure on the stomach, which can worsen heartburn and reflux. -Reduce excess weight around the midsection. This can ease pressure on the stomach. Such pressure can force some stomach contents back up the esophagus

## 2023-08-22 DIAGNOSIS — S82035A Nondisplaced transverse fracture of left patella, initial encounter for closed fracture: Secondary | ICD-10-CM | POA: Diagnosis not present

## 2023-08-22 DIAGNOSIS — M25562 Pain in left knee: Secondary | ICD-10-CM | POA: Diagnosis not present

## 2023-08-22 DIAGNOSIS — M21611 Bunion of right foot: Secondary | ICD-10-CM | POA: Diagnosis not present

## 2023-09-02 DIAGNOSIS — S82035A Nondisplaced transverse fracture of left patella, initial encounter for closed fracture: Secondary | ICD-10-CM | POA: Diagnosis not present

## 2023-09-02 DIAGNOSIS — M25562 Pain in left knee: Secondary | ICD-10-CM | POA: Diagnosis not present

## 2023-09-11 DIAGNOSIS — R531 Weakness: Secondary | ICD-10-CM | POA: Diagnosis not present

## 2023-09-11 DIAGNOSIS — M25562 Pain in left knee: Secondary | ICD-10-CM | POA: Diagnosis not present

## 2023-09-11 DIAGNOSIS — M25572 Pain in left ankle and joints of left foot: Secondary | ICD-10-CM | POA: Diagnosis not present

## 2023-09-11 DIAGNOSIS — R2689 Other abnormalities of gait and mobility: Secondary | ICD-10-CM | POA: Diagnosis not present

## 2023-09-12 DIAGNOSIS — R2689 Other abnormalities of gait and mobility: Secondary | ICD-10-CM | POA: Diagnosis not present

## 2023-09-12 DIAGNOSIS — M21611 Bunion of right foot: Secondary | ICD-10-CM | POA: Diagnosis not present

## 2023-09-22 DIAGNOSIS — M25562 Pain in left knee: Secondary | ICD-10-CM | POA: Diagnosis not present

## 2023-09-22 DIAGNOSIS — M25572 Pain in left ankle and joints of left foot: Secondary | ICD-10-CM | POA: Diagnosis not present

## 2023-09-22 DIAGNOSIS — R2689 Other abnormalities of gait and mobility: Secondary | ICD-10-CM | POA: Diagnosis not present

## 2023-09-22 DIAGNOSIS — R531 Weakness: Secondary | ICD-10-CM | POA: Diagnosis not present

## 2023-09-24 DIAGNOSIS — R531 Weakness: Secondary | ICD-10-CM | POA: Diagnosis not present

## 2023-09-24 DIAGNOSIS — M25572 Pain in left ankle and joints of left foot: Secondary | ICD-10-CM | POA: Diagnosis not present

## 2023-09-24 DIAGNOSIS — M25562 Pain in left knee: Secondary | ICD-10-CM | POA: Diagnosis not present

## 2023-09-24 DIAGNOSIS — R2689 Other abnormalities of gait and mobility: Secondary | ICD-10-CM | POA: Diagnosis not present

## 2023-09-30 DIAGNOSIS — R531 Weakness: Secondary | ICD-10-CM | POA: Diagnosis not present

## 2023-09-30 DIAGNOSIS — R2689 Other abnormalities of gait and mobility: Secondary | ICD-10-CM | POA: Diagnosis not present

## 2023-09-30 DIAGNOSIS — M25572 Pain in left ankle and joints of left foot: Secondary | ICD-10-CM | POA: Diagnosis not present

## 2023-09-30 DIAGNOSIS — M25562 Pain in left knee: Secondary | ICD-10-CM | POA: Diagnosis not present

## 2023-10-02 DIAGNOSIS — M25572 Pain in left ankle and joints of left foot: Secondary | ICD-10-CM | POA: Diagnosis not present

## 2023-10-02 DIAGNOSIS — M25562 Pain in left knee: Secondary | ICD-10-CM | POA: Diagnosis not present

## 2023-10-02 DIAGNOSIS — R531 Weakness: Secondary | ICD-10-CM | POA: Diagnosis not present

## 2023-10-02 DIAGNOSIS — R2689 Other abnormalities of gait and mobility: Secondary | ICD-10-CM | POA: Diagnosis not present

## 2023-10-07 DIAGNOSIS — M25562 Pain in left knee: Secondary | ICD-10-CM | POA: Diagnosis not present

## 2023-10-08 DIAGNOSIS — M25572 Pain in left ankle and joints of left foot: Secondary | ICD-10-CM | POA: Diagnosis not present

## 2023-10-08 DIAGNOSIS — R531 Weakness: Secondary | ICD-10-CM | POA: Diagnosis not present

## 2023-10-08 DIAGNOSIS — R2689 Other abnormalities of gait and mobility: Secondary | ICD-10-CM | POA: Diagnosis not present

## 2023-10-08 DIAGNOSIS — M25562 Pain in left knee: Secondary | ICD-10-CM | POA: Diagnosis not present

## 2023-10-14 DIAGNOSIS — R531 Weakness: Secondary | ICD-10-CM | POA: Diagnosis not present

## 2023-10-14 DIAGNOSIS — R2689 Other abnormalities of gait and mobility: Secondary | ICD-10-CM | POA: Diagnosis not present

## 2023-10-14 DIAGNOSIS — M25562 Pain in left knee: Secondary | ICD-10-CM | POA: Diagnosis not present

## 2023-10-14 DIAGNOSIS — M25572 Pain in left ankle and joints of left foot: Secondary | ICD-10-CM | POA: Diagnosis not present

## 2023-10-16 DIAGNOSIS — M25562 Pain in left knee: Secondary | ICD-10-CM | POA: Diagnosis not present

## 2023-10-16 DIAGNOSIS — M25572 Pain in left ankle and joints of left foot: Secondary | ICD-10-CM | POA: Diagnosis not present

## 2023-10-16 DIAGNOSIS — R2689 Other abnormalities of gait and mobility: Secondary | ICD-10-CM | POA: Diagnosis not present

## 2023-10-16 DIAGNOSIS — R531 Weakness: Secondary | ICD-10-CM | POA: Diagnosis not present

## 2023-10-20 DIAGNOSIS — M25572 Pain in left ankle and joints of left foot: Secondary | ICD-10-CM | POA: Diagnosis not present

## 2023-10-20 DIAGNOSIS — R531 Weakness: Secondary | ICD-10-CM | POA: Diagnosis not present

## 2023-10-20 DIAGNOSIS — M25562 Pain in left knee: Secondary | ICD-10-CM | POA: Diagnosis not present

## 2023-10-20 DIAGNOSIS — R2689 Other abnormalities of gait and mobility: Secondary | ICD-10-CM | POA: Diagnosis not present

## 2023-10-22 DIAGNOSIS — M25562 Pain in left knee: Secondary | ICD-10-CM | POA: Diagnosis not present

## 2023-10-22 DIAGNOSIS — R2689 Other abnormalities of gait and mobility: Secondary | ICD-10-CM | POA: Diagnosis not present

## 2023-10-22 DIAGNOSIS — M25572 Pain in left ankle and joints of left foot: Secondary | ICD-10-CM | POA: Diagnosis not present

## 2023-10-22 DIAGNOSIS — R531 Weakness: Secondary | ICD-10-CM | POA: Diagnosis not present

## 2023-10-27 DIAGNOSIS — M25562 Pain in left knee: Secondary | ICD-10-CM | POA: Diagnosis not present

## 2023-10-27 DIAGNOSIS — R531 Weakness: Secondary | ICD-10-CM | POA: Diagnosis not present

## 2023-10-27 DIAGNOSIS — M25572 Pain in left ankle and joints of left foot: Secondary | ICD-10-CM | POA: Diagnosis not present

## 2023-10-27 DIAGNOSIS — R2689 Other abnormalities of gait and mobility: Secondary | ICD-10-CM | POA: Diagnosis not present

## 2023-10-29 ENCOUNTER — Telehealth: Payer: Self-pay | Admitting: Family Medicine

## 2023-10-29 NOTE — Telephone Encounter (Signed)
 Copied from CRM [id number]. Topic: Medicare AWV >> Oct 29, 2023  1:28 PM Brittany DEL wrote: Reason for CRM: LVM 10/29/2023 to schedule AWV. Please schedule Virtual or Telehealth visits ONLY.   Brittany Salinas; Care Guide Ambulatory Clinical Support [place] [facility] Direct Dial: [phone]

## 2023-11-06 DIAGNOSIS — R531 Weakness: Secondary | ICD-10-CM | POA: Diagnosis not present

## 2023-11-06 DIAGNOSIS — R2689 Other abnormalities of gait and mobility: Secondary | ICD-10-CM | POA: Diagnosis not present

## 2023-11-06 DIAGNOSIS — M25562 Pain in left knee: Secondary | ICD-10-CM | POA: Diagnosis not present

## 2023-11-06 DIAGNOSIS — M25572 Pain in left ankle and joints of left foot: Secondary | ICD-10-CM | POA: Diagnosis not present

## 2023-11-14 DIAGNOSIS — R531 Weakness: Secondary | ICD-10-CM | POA: Diagnosis not present

## 2023-11-14 DIAGNOSIS — M25572 Pain in left ankle and joints of left foot: Secondary | ICD-10-CM | POA: Diagnosis not present

## 2023-11-14 DIAGNOSIS — M25562 Pain in left knee: Secondary | ICD-10-CM | POA: Diagnosis not present

## 2023-11-14 DIAGNOSIS — R2689 Other abnormalities of gait and mobility: Secondary | ICD-10-CM | POA: Diagnosis not present

## 2023-11-18 DIAGNOSIS — R531 Weakness: Secondary | ICD-10-CM | POA: Diagnosis not present

## 2023-11-18 DIAGNOSIS — M79671 Pain in right foot: Secondary | ICD-10-CM | POA: Diagnosis not present

## 2023-11-18 DIAGNOSIS — M25562 Pain in left knee: Secondary | ICD-10-CM | POA: Diagnosis not present

## 2023-11-18 DIAGNOSIS — R2689 Other abnormalities of gait and mobility: Secondary | ICD-10-CM | POA: Diagnosis not present

## 2023-11-18 DIAGNOSIS — M25572 Pain in left ankle and joints of left foot: Secondary | ICD-10-CM | POA: Diagnosis not present

## 2023-11-27 DIAGNOSIS — M25572 Pain in left ankle and joints of left foot: Secondary | ICD-10-CM | POA: Diagnosis not present

## 2023-11-27 DIAGNOSIS — R2689 Other abnormalities of gait and mobility: Secondary | ICD-10-CM | POA: Diagnosis not present

## 2023-11-27 DIAGNOSIS — R531 Weakness: Secondary | ICD-10-CM | POA: Diagnosis not present

## 2023-11-27 DIAGNOSIS — M25562 Pain in left knee: Secondary | ICD-10-CM | POA: Diagnosis not present

## 2023-12-01 DIAGNOSIS — M25572 Pain in left ankle and joints of left foot: Secondary | ICD-10-CM | POA: Diagnosis not present

## 2023-12-01 DIAGNOSIS — R531 Weakness: Secondary | ICD-10-CM | POA: Diagnosis not present

## 2023-12-01 DIAGNOSIS — R2689 Other abnormalities of gait and mobility: Secondary | ICD-10-CM | POA: Diagnosis not present

## 2023-12-01 DIAGNOSIS — M25562 Pain in left knee: Secondary | ICD-10-CM | POA: Diagnosis not present

## 2023-12-03 ENCOUNTER — Other Ambulatory Visit (HOSPITAL_BASED_OUTPATIENT_CLINIC_OR_DEPARTMENT_OTHER): Payer: Self-pay | Admitting: Family Medicine

## 2023-12-03 DIAGNOSIS — Z1231 Encounter for screening mammogram for malignant neoplasm of breast: Secondary | ICD-10-CM

## 2023-12-05 DIAGNOSIS — R2689 Other abnormalities of gait and mobility: Secondary | ICD-10-CM | POA: Diagnosis not present

## 2023-12-05 DIAGNOSIS — M25572 Pain in left ankle and joints of left foot: Secondary | ICD-10-CM | POA: Diagnosis not present

## 2023-12-05 DIAGNOSIS — M25562 Pain in left knee: Secondary | ICD-10-CM | POA: Diagnosis not present

## 2023-12-05 DIAGNOSIS — R531 Weakness: Secondary | ICD-10-CM | POA: Diagnosis not present

## 2023-12-08 DIAGNOSIS — R531 Weakness: Secondary | ICD-10-CM | POA: Diagnosis not present

## 2023-12-08 DIAGNOSIS — M25562 Pain in left knee: Secondary | ICD-10-CM | POA: Diagnosis not present

## 2023-12-08 DIAGNOSIS — M25572 Pain in left ankle and joints of left foot: Secondary | ICD-10-CM | POA: Diagnosis not present

## 2023-12-08 DIAGNOSIS — R2689 Other abnormalities of gait and mobility: Secondary | ICD-10-CM | POA: Diagnosis not present

## 2023-12-09 ENCOUNTER — Encounter (HOSPITAL_BASED_OUTPATIENT_CLINIC_OR_DEPARTMENT_OTHER): Payer: Self-pay

## 2023-12-09 ENCOUNTER — Ambulatory Visit (HOSPITAL_BASED_OUTPATIENT_CLINIC_OR_DEPARTMENT_OTHER)
Admission: RE | Admit: 2023-12-09 | Discharge: 2023-12-09 | Disposition: A | Discharge status: Home / Self Care | Source: Ambulatory Visit | Attending: Family Medicine | Admitting: Family Medicine

## 2023-12-09 DIAGNOSIS — Z1231 Encounter for screening mammogram for malignant neoplasm of breast: Secondary | ICD-10-CM | POA: Insufficient documentation

## 2023-12-10 DIAGNOSIS — M25572 Pain in left ankle and joints of left foot: Secondary | ICD-10-CM | POA: Diagnosis not present

## 2023-12-10 DIAGNOSIS — M25562 Pain in left knee: Secondary | ICD-10-CM | POA: Diagnosis not present

## 2023-12-10 DIAGNOSIS — R531 Weakness: Secondary | ICD-10-CM | POA: Diagnosis not present

## 2023-12-10 DIAGNOSIS — R2689 Other abnormalities of gait and mobility: Secondary | ICD-10-CM | POA: Diagnosis not present

## 2023-12-29 DIAGNOSIS — M25562 Pain in left knee: Secondary | ICD-10-CM | POA: Diagnosis not present

## 2023-12-29 DIAGNOSIS — M25572 Pain in left ankle and joints of left foot: Secondary | ICD-10-CM | POA: Diagnosis not present

## 2023-12-29 DIAGNOSIS — R531 Weakness: Secondary | ICD-10-CM | POA: Diagnosis not present

## 2023-12-29 DIAGNOSIS — R2689 Other abnormalities of gait and mobility: Secondary | ICD-10-CM | POA: Diagnosis not present

## 2024-01-06 DIAGNOSIS — R531 Weakness: Secondary | ICD-10-CM | POA: Diagnosis not present

## 2024-01-06 DIAGNOSIS — R2689 Other abnormalities of gait and mobility: Secondary | ICD-10-CM | POA: Diagnosis not present

## 2024-01-06 DIAGNOSIS — M25572 Pain in left ankle and joints of left foot: Secondary | ICD-10-CM | POA: Diagnosis not present

## 2024-01-06 DIAGNOSIS — M25562 Pain in left knee: Secondary | ICD-10-CM | POA: Diagnosis not present

## 2024-01-14 DIAGNOSIS — M25562 Pain in left knee: Secondary | ICD-10-CM | POA: Diagnosis not present

## 2024-01-14 DIAGNOSIS — R2689 Other abnormalities of gait and mobility: Secondary | ICD-10-CM | POA: Diagnosis not present

## 2024-01-14 DIAGNOSIS — R531 Weakness: Secondary | ICD-10-CM | POA: Diagnosis not present

## 2024-01-14 DIAGNOSIS — M25572 Pain in left ankle and joints of left foot: Secondary | ICD-10-CM | POA: Diagnosis not present

## 2024-01-18 ENCOUNTER — Other Ambulatory Visit: Payer: Self-pay | Admitting: Family Medicine

## 2024-01-18 ENCOUNTER — Other Ambulatory Visit (INDEPENDENT_AMBULATORY_CARE_PROVIDER_SITE_OTHER): Payer: Self-pay | Admitting: Otolaryngology

## 2024-01-26 DIAGNOSIS — R2689 Other abnormalities of gait and mobility: Secondary | ICD-10-CM | POA: Diagnosis not present

## 2024-01-26 DIAGNOSIS — R531 Weakness: Secondary | ICD-10-CM | POA: Diagnosis not present

## 2024-01-26 DIAGNOSIS — M25562 Pain in left knee: Secondary | ICD-10-CM | POA: Diagnosis not present

## 2024-01-26 DIAGNOSIS — M25572 Pain in left ankle and joints of left foot: Secondary | ICD-10-CM | POA: Diagnosis not present

## 2024-02-02 LAB — HM DEXA SCAN

## 2024-02-12 ENCOUNTER — Encounter: Payer: Self-pay | Admitting: Family Medicine

## 2024-02-16 ENCOUNTER — Telehealth: Payer: Self-pay | Admitting: Family Medicine

## 2024-02-16 ENCOUNTER — Ambulatory Visit: Payer: Self-pay

## 2024-02-16 NOTE — Telephone Encounter (Signed)
 FYI Only or Action Required?: FYI only for provider.  Patient was last seen in primary care on 03/06/2023 by Almarie Waddell NOVAK, NP.  Called Nurse Triage reporting Urinary Frequency.  Symptoms began several weeks ago.  Interventions attempted: Nothing.  Symptoms are: unchanged.  Triage Disposition: See Physician Within 24 Hours  Patient/caregiver understands and will follow disposition?: No, refuses disposition  Pt  has an appt already scheduled on Wednesday. RN did offer appt for tomorrow and pt refused and stated she'd just discuss it at Wednesday's appt.     Copied from CRM [id number]. Topic: Clinical - Red Word Triage >> Feb 16, 2024  4:23 PM Roselie BROCKS wrote: Red Word that prompted transfer to Nurse Triage: Patient states she feels she has a UTI, has pain and burning during urination, Reason for Disposition  Urinating more frequently than usual (i.e., frequency) OR new-onset of the feeling of an urgent need to urinate (i.e., urgency)  Answer Assessment - Initial Assessment Questions 1. SYMPTOM: What's the main symptom you're concerned about? (e.g., frequency, incontinence)     Urgency, frequency, leaking, cloudy 2. ONSET: When did the  frequency  start?      About a month or 2 ago 3. PAIN: Is there any pain? If Yes, ask: How bad is it? (Scale: 1-10; mild, moderate, severe)     No pain 4. CAUSE: What do you think is causing the symptoms?     More sexually active recently 5. OTHER SYMPTOMS: Do you have any other symptoms? (e.g., blood in urine, fever, flank pain, pain with urination)     no  Protocols used: Urinary Symptoms-A-AH

## 2024-02-16 NOTE — Telephone Encounter (Signed)
Lvm to call and schedule

## 2024-02-16 NOTE — Telephone Encounter (Signed)
 Overdue for appt. Can you try reaching out to her to schedule w/ [place] please?

## 2024-02-16 NOTE — Telephone Encounter (Signed)
 Pt scheduled with Harlene Jolly, NP

## 2024-02-17 NOTE — Progress Notes (Unsigned)
 No chief complaint on file.   Brittany Salinas is a 73 y.o. female here for possible UTI.  Duration: {0 - 10:19007} {Time; day/wk/mo/yr:20843}. Symptoms: Dysuria, {Symptoms; urinary:12437} Denies: {Symptoms; urinary:12437}, vaginal discharge Hx of recurrent UTI? {yes/no:20286} Denies new sexual partners.  Past Medical History:  Diagnosis Date   Blood transfusion without reported diagnosis 1997   Complications c-section   Cataracts, both eyes    Closed fracture of fifth metatarsal bone 12/14/2021   Closed fracture of fourth metatarsal bone 12/14/2021   Colon polyp 10/09/2017   Dyspareunia in female 11/16/2018   Family history of adverse reaction to anesthesia    mother and sister - slow to awaken   Gait disturbance 04/18/2016   Generalized anxiety disorder 09/17/2022   History of COVID-19 03/16/2021   Hyperlipidemia    Hypertension    Insomnia    Left elbow fracture 02/12/2020   Left knee pain 05/13/2017   Lumbar radiculopathy 01/22/2022   Lumbar spondylosis 03/05/2022   Major depressive disorder 04/18/2016   Obesity 04/13/2018   Osteoarthritis    Osteoporosis 01/21/2023   Pain in joint of right hip 01/04/2022   Right foot pain 10/09/2017   Subjective memory complaints 04/18/2016   Vaginitis 04/13/2018   Vertigo 01/29/2019   Vision abnormalities      There were no vitals taken for this visit. General: Awake, alert, appears stated age Heart: RRR Lungs: CTAB, normal respiratory effort, no accessory muscle usage Abd: BS+, soft, NT, ND, no masses or organomegaly MSK: No CVA tenderness,  Psych: Age appropriate judgment and insight  No diagnosis found.  Stay hydrated. Seek immediate care if pt starts to develop fevers, new/worsening symptoms, uncontrollable N/V. F/u prn. The patient voiced understanding and agreement to the plan.  Harlene LITTIE Jolly, DNP, AGNP-C 02/17/24 6:41 PM

## 2024-02-18 ENCOUNTER — Ambulatory Visit: Admitting: Student

## 2024-02-18 VITALS — BP 132/78 | HR 58 | Temp 98.2°F | Resp 12 | Ht 63.0 in | Wt 161.4 lb

## 2024-02-18 DIAGNOSIS — R32 Unspecified urinary incontinence: Secondary | ICD-10-CM | POA: Diagnosis not present

## 2024-02-18 DIAGNOSIS — E785 Hyperlipidemia, unspecified: Secondary | ICD-10-CM

## 2024-02-18 DIAGNOSIS — R350 Frequency of micturition: Secondary | ICD-10-CM | POA: Diagnosis not present

## 2024-02-18 DIAGNOSIS — R4189 Other symptoms and signs involving cognitive functions and awareness: Secondary | ICD-10-CM

## 2024-02-18 DIAGNOSIS — H9190 Unspecified hearing loss, unspecified ear: Secondary | ICD-10-CM

## 2024-02-18 DIAGNOSIS — U099 Post covid-19 condition, unspecified: Secondary | ICD-10-CM

## 2024-02-18 LAB — POCT URINALYSIS DIP (MANUAL ENTRY)
Bilirubin, UA: NEGATIVE
Blood, UA: NEGATIVE
Glucose, UA: NEGATIVE mg/dL
Ketones, POC UA: NEGATIVE mg/dL
Nitrite, UA: NEGATIVE
Protein Ur, POC: NEGATIVE mg/dL
Spec Grav, UA: <=1.005 — AB (ref 1.010–1.025)
Urobilinogen, UA: 0.2 U/dL
pH, UA: 5 (ref 5.0–8.0)

## 2024-02-18 MED ORDER — NITROFURANTOIN MONOHYD MACRO 100 MG PO CAPS: 100.0000 mg | ORAL_CAPSULE | Freq: Two times a day (BID) | ORAL | 0 refills | Status: AC

## 2024-02-19 LAB — CBC
HCT: 39.2 % (ref 36.0–46.0)
Hemoglobin: 13 g/dL (ref 12.0–15.0)
MCHC: 33.2 g/dL (ref 30.0–36.0)
MCV: 91.2 fl (ref 78.0–100.0)
Platelets: 230 K/uL (ref 150.0–400.0)
RBC: 4.3 Mil/uL (ref 3.87–5.11)
RDW: 12.5 % (ref 11.5–15.5)
WBC: 8.8 K/uL (ref 4.0–10.5)

## 2024-02-19 LAB — LIPID PANEL
Cholesterol: 251 mg/dL — ABNORMAL HIGH (ref 0–200)
HDL: 51.8 mg/dL (ref >39.00)
LDL Cholesterol: 155 mg/dL — ABNORMAL HIGH (ref 0–99)
NonHDL: 198.76
Total CHOL/HDL Ratio: 5
Triglycerides: 219 mg/dL — ABNORMAL HIGH (ref 0.0–149.0)
VLDL: 43.8 mg/dL — ABNORMAL HIGH (ref 0.0–40.0)

## 2024-02-19 LAB — COMPREHENSIVE METABOLIC PANEL WITH GFR
ALT: 13 U/L (ref 0–35)
AST: 18 U/L (ref 0–37)
Albumin: 4.4 g/dL (ref 3.5–5.2)
Alkaline Phosphatase: 65 U/L (ref 39–117)
BUN: 17 mg/dL (ref 6–23)
CO2: 26 meq/L (ref 19–32)
Calcium: 9.4 mg/dL (ref 8.4–10.5)
Chloride: 105 meq/L (ref 96–112)
Creatinine, Ser: 0.74 mg/dL (ref 0.40–1.20)
GFR: 80.8 mL/min (ref >60.00)
Glucose, Bld: 81 mg/dL (ref 70–99)
Potassium: 4.3 meq/L (ref 3.5–5.1)
Sodium: 142 meq/L (ref 135–145)
Total Bilirubin: 0.6 mg/dL (ref 0.2–1.2)
Total Protein: 6.8 g/dL (ref 6.0–8.3)

## 2024-02-19 LAB — VITAMIN D 25 HYDROXY (VIT D DEFICIENCY, FRACTURES): VITD: 51.89 ng/mL (ref 30.00–100.00)

## 2024-02-19 LAB — VITAMIN B12: Vitamin B-12: 243 pg/mL (ref 211–911)

## 2024-02-19 LAB — TSH: TSH: 3.11 u[IU]/mL (ref 0.35–5.50)

## 2024-02-20 ENCOUNTER — Ambulatory Visit: Payer: Self-pay | Admitting: Student

## 2024-02-20 LAB — URINE CULTURE
MICRO NUMBER:: 17102759
SPECIMEN QUALITY:: ADEQUATE

## 2024-03-18 ENCOUNTER — Ambulatory Visit: Attending: Student | Admitting: Audiology

## 2024-03-23 ENCOUNTER — Telehealth: Payer: Self-pay | Admitting: *Deleted

## 2024-03-23 ENCOUNTER — Ambulatory Visit (INDEPENDENT_AMBULATORY_CARE_PROVIDER_SITE_OTHER): Admitting: *Deleted

## 2024-03-23 VITALS — Ht 63.0 in | Wt 164.0 lb

## 2024-03-23 DIAGNOSIS — Z0000 Encounter for general adult medical examination without abnormal findings: Secondary | ICD-10-CM | POA: Diagnosis not present

## 2024-03-23 NOTE — Patient Instructions (Addendum)
 Brittany Salinas,  Thank you for taking the time for your Medicare Wellness Visit. I appreciate your continued commitment to your health goals. Please review the care plan we discussed, and feel free to reach out if I can assist you further.  Please note that Annual Wellness Visits do not include a physical exam. Some assessments may be limited, especially if the visit was conducted virtually. If needed, we may recommend an in-person follow-up with your provider.  Ongoing Care Seeing your primary care provider every 3 to 6 months helps us  monitor your health and provide consistent, personalized care.   Dr Domenica: 07/08/24 8:40am Annual Wellness Visit:  03/25/25 9am, telephone   Referrals If a referral was made during today's visit and you haven't received any updates within two weeks, please contact the referred provider directly to check on the status.   GI (colonoscopy):  [phone] due on or after 07/04/24.  Recommended Screenings:  You will need to get the following vaccines at your local pharmacy: Tetanus, pneumonia (if you change your mind).  Health Maintenance  Topic Date Due   Medicare Annual Wellness Visit  05/25/2023   Colon Cancer Screening  07/04/2024   DTaP/Tdap/Td vaccine (1 - Tdap) 04/19/2024*   Zoster (Shingles) Vaccine (2 of 2) 04/19/2024*   Flu Shot  08/03/2024*   Pneumococcal Vaccine for age over 61 (1 of 1 - PCV) 02/17/2025*   Breast Cancer Screening  12/08/2025   DEXA scan (bone density measurement)  Completed   Hepatitis C Screening  Completed   Meningitis B Vaccine  Aged Out   COVID-19 Vaccine  Discontinued  *Topic was postponed. The date shown is not the original due date.       03/23/2024    9:02 AM  Advanced Directives  Does Patient Have a Medical Advance Directive? No  Would patient like information on creating a medical advance directive? Yes (MAU/Ambulatory/Procedural Areas - Information given)  Please let me know if you do not receive your  Advanced Directive Packet within 1 week. Once completed and notarized, you may return a copy of your Advanced Directive(s) by either of the following:  Bring a copy of your health care power of attorney and living will to the office to be added to your chart at your convenience. You can mail a copy to [facility] [address] [place], [place] [address] or email to [email or web address]   Vision: Annual vision screenings are recommended for early detection of glaucoma, cataracts, and diabetic retinopathy. These exams can also reveal signs of chronic conditions such as diabetes and high blood pressure.  Dental: Annual dental screenings help detect early signs of oral cancer, gum disease, and other conditions linked to overall health, including heart disease and diabetes.  Please see the attached documents for additional preventive care recommendations.

## 2024-03-23 NOTE — Progress Notes (Signed)
 Please attest this visit in the absence of patient primary care provider.    Chief Complaint  Patient presents with   Medicare Wellness     Subjective:   Brittany Salinas is a 73 y.o. female who presents for a Medicare Annual Wellness Visit.  Allergies (verified) Shellfish allergy, Codeine, and Crestor  [rosuvastatin ]   History: Past Medical History:  Diagnosis Date   Blood transfusion without reported diagnosis 1997   Complications c-section   Cataracts, both eyes    Closed fracture of fifth metatarsal bone 12/14/2021   Closed fracture of fourth metatarsal bone 12/14/2021   Colon polyp 10/09/2017   Dyspareunia in female 11/16/2018   Family history of adverse reaction to anesthesia    mother and sister - slow to awaken   Gait disturbance 04/18/2016   Generalized anxiety disorder 09/17/2022   History of COVID-19 03/16/2021   Hyperlipidemia    Hypertension    Insomnia    Left elbow fracture 02/12/2020   Left knee pain 05/13/2017   Lumbar radiculopathy 01/22/2022   Lumbar spondylosis 03/05/2022   Major depressive disorder 04/18/2016   Neuromuscular disorder (HCC)    Obesity 04/13/2018   Osteoarthritis    Osteoporosis 01/21/2023   Pain in joint of right hip 01/04/2022   Right foot pain 10/09/2017   Subjective memory complaints 04/18/2016   Vaginitis 04/13/2018   Vertigo 01/29/2019   Vision abnormalities    Past Surgical History:  Procedure Laterality Date   BREAST BIOPSY Right    benign   BREAST REDUCTION SURGERY  2007   CESAREAN SECTION     4 c-sections   COLONOSCOPY  2008   COLONOSCOPY W/ POLYPECTOMY  11/15/2011   Dr. Gladies. [facility]. A single semi-pedunculated non-bleeding polyp of benigh apperance was found in the distal ascending colon. A single-piece polypectomy was performed using hot snare in the distal ascending colon. The polyp was completely removed.   EYE SURGERY Bilateral    catarcts   FRACTURE SURGERY  Oct 2021   Shattered  elbow   ORIF ELBOW FRACTURE Left 02/12/2020   Procedure: Left elbow olecranon osteotomy, ulnar nerve release, and surgical reconstruction distal end of the humerus with repair;  Surgeon: Camella Fallow, MD;  Location: [facility] OR;  Service: Orthopedics;  Laterality: Left;  2.5 hrs   REDUCTION MAMMAPLASTY Bilateral    ROTATOR CUFF REPAIR Bilateral    right in 2010, left in 2017.   TONSILLECTOMY     TUBAL LIGATION     Family History  Problem Relation Age of Onset   Colon polyps Mother    Heart defect Mother        valvular heart disease   Depression Mother    Memory loss Mother        vascular in nature   Heart attack Father    Hyperlipidemia Father    Hypertension Father    Heart disease Father    Colon polyps Sister    Marfan syndrome Sister    Other Sister        connective tissue disorder   Hypertension Brother    Other Brother        vertigo   Cancer Maternal Grandmother        stomach   Asthma Maternal Grandfather    Stroke Maternal Grandfather    Marfan syndrome Maternal Grandfather        ?   Alcohol abuse Maternal Grandfather    Psoriasis Daughter    Mental illness Son  depression alcohol in past   Depression Son    Arrhythmia Son    Alcohol abuse Son        h/o drug abuse   Breast cancer Maternal Aunt    Colon cancer Neg Hx    Esophageal cancer Neg Hx    Rectal cancer Neg Hx    Stomach cancer Neg Hx    Social History   Occupational History   Occupation: Retired  Tobacco Use   Smoking status: Never   Smokeless tobacco: Never  Vaping Use   Vaping status: Never Used  Substance and Sexual Activity   Alcohol use: Yes    Alcohol/week: 2.0 standard drinks of alcohol    Types: 2 Glasses of wine per week    Comment: 1-2 drinks weekly   Drug use: No   Sexual activity: Not on file   Tobacco Counseling Counseling given: Not Answered  SDOH Screenings   Food Insecurity: No Food Insecurity (03/23/2024)  Housing: Low Risk  (03/23/2024)  Transportation  Needs: No Transportation Needs (03/23/2024)  Utilities: Not At Risk (03/23/2024)  Alcohol Screen: Low Risk  (02/18/2024)  Depression (PHQ2-9): Medium Risk (03/23/2024)  Financial Resource Strain: Low Risk  (02/18/2024)  Physical Activity: Insufficiently Active (03/23/2024)  Social Connections: Socially Integrated (03/23/2024)  Stress: Stress Concern Present (03/23/2024)  Tobacco Use: Low Risk  (03/23/2024)   See flowsheets for full screening details  Depression Screen PHQ 2 & 9 Depression Scale- Over the past 2 weeks, how often have you been bothered by any of the following problems? Little interest or pleasure in doing things: 0 Feeling down, depressed, or hopeless (PHQ Adolescent also includes...irritable): 0 PHQ-2 Total Score: 0 Trouble falling or staying asleep, or sleeping too much: 3 (takes melatonin) Feeling tired or having little energy: 3 Poor appetite or overeating (PHQ Adolescent also includes...weight loss): 0 Feeling bad about yourself - or that you are a failure or have let yourself or your family down: 0 Trouble concentrating on things, such as reading the newspaper or watching television (PHQ Adolescent also includes...like school work): 2 (has been a problem since Covid, can't stay focused and can't retain what is read) Moving or speaking so slowly that other people could have noticed. Or the opposite - being so fidgety or restless that you have been moving around a lot more than usual: 1 (feels this was due to balance issues) Thoughts that you would be better off dead, or of hurting yourself in some way: 0 PHQ-9 Total Score: 9     Goals Addressed             This Visit's Progress    To discuss new activity she can participate in at the [facility] with her balance issues         Visit info / Clinical Intake: Medicare Wellness Visit Type:: Subsequent Annual Wellness Visit Persons participating in visit:: patient Medicare Wellness Visit Mode:: Telephone If  telephone:: video declined Because this visit was a virtual/telehealth visit:: pt reported vitals If Telephone or Video please confirm:: I connected with the patient using audio enabled telemedicine application and verified that I am speaking with the correct person using two identifiers; The patient expressed understanding and agreed to proceed; I discussed the limitations of evaluation and management by telemedicine Patient Location:: home Provider Location:: office Information given by:: patient Interpreter Needed?: No Pre-visit prep was completed: yes AWV questionnaire completed by patient prior to visit?: no Living arrangements:: lives with spouse/significant other Patient's Overall Health Status Rating: ROLLEN)  fair Typical amount of pain: some Does pain affect daily life?: (!) yes (when it goes up to a higher level) Are you currently prescribed opioids?: no  Dietary Habits and Nutritional Risks How many meals a day?: 2 (2-3 meals a day) Eats fruit and vegetables daily?: yes Most meals are obtained by: preparing own meals In the last 2 weeks, have you had any of the following?: none (has frequent nausea & dizziness. Takes motion sickness medication that helps.) Diabetic:: no  Functional Status Activities of Daily Living (to include ambulation/medication): Independent Ambulation: Independent Medication Administration: Independent Home Management: Independent (spouse assists) Manage your own finances?: (!) no (spouse manages) Primary transportation is: driving Concerns about vision?: no *vision screening is required for [facility]* (up to date with Dr Randon Everitt Dawn) Concerns about hearing?: (!) yes (Has to repeat herself. Had to cancel appt with audiologist this week but will reschedule) Uses hearing aids?: no  Fall Screening Falls in the past year?: 1 Number of falls in past year: 0 Was there an injury with Fall?: 1 (has completed PT) Fall Risk Category Calculator: 2 Patient  Fall Risk Level: Moderate Fall Risk  Fall Risk Patient at Risk for Falls Due to: Impaired balance/gait Fall risk Follow up: Falls evaluation completed  Home and Transportation Safety: All rugs have non-skid backing?: (!) no All stairs or steps have railings?: (!) no (one step into house without rail) Grab bars in the bathtub or shower?: (!) no Have non-skid surface in bathtub or shower?: yes Good home lighting?: yes Regular seat belt use?: yes Hospital stays in the last year:: no  Cognitive Assessment Difficulty concentrating, remembering, or making decisions? : yes (has brain fog since Covid) Will 6CIT or Mini Cog be Completed: yes What year is it?: 0 points What month is it?: 0 points Give patient an address phrase to remember (5 components): [address], [place]  About what time is it?: 0 points Count backwards from 20 to 1: 0 points Say the months of the year in reverse: 0 points Repeat the address phrase from earlier: 0 points 6 CIT Score: 0 points  Advance Directives (For Healthcare) Does Patient Have a Medical Advance Directive?: No Would patient like information on creating a medical advance directive?: Yes (MAU/Ambulatory/Procedural Areas - Information given)  Reviewed/Updated  Reviewed/Updated: Reviewed All (Medical, Surgical, Family, Medications, Allergies, Care Teams, Patient Goals)        Objective:    Today's Vitals   03/23/24 0859  Weight: 164 lb (74.4 kg)  Height: 5' 3 (1.6 m)   Body mass index is 29.05 kg/m.  Current Medications (verified) Outpatient Encounter Medications as of 03/23/2024  Medication Sig   Aromatic Inhalants (INHALER DECONGESTANT IN) Inhale into the lungs.   Ascorbic Acid  (VITAMIN C) 1000 MG tablet Take 1,000 mg by mouth daily.   aspirin  EC 81 MG tablet Take 81 mg by mouth daily. Swallow whole.   b complex vitamins capsule Take 1 capsule by mouth daily. 500mg    buPROPion  (WELLBUTRIN  XL) 300 MG 24 hr tablet Take 1  tablet (300 mg total) by mouth daily.   cetirizine  (ZYRTEC ) 10 MG tablet Take 1 tablet (10 mg total) by mouth daily.   famotidine  (PEPCID ) 20 MG tablet TAKE 1 TABLET BY MOUTH TWICE A DAY   fluticasone  (FLONASE ) 50 MCG/ACT nasal spray Place 2 sprays into both nostrils 2 (two) times daily.   Krill Oil 500 MG CAPS Take 1,200 mg by mouth daily.   magnesium oxide (MAG-OX) 400 MG  tablet Take 400 mg by mouth at bedtime.   meclizine  (ANTIVERT ) 12.5 MG tablet Take 1 tablet (12.5 mg total) by mouth 3 (three) times daily as needed for dizziness.   Melatonin 10 MG TABS Take 5 mg by mouth at bedtime.   metoprolol  succinate (TOPROL -XL) 50 MG 24 hr tablet Take 1 tablet (50 mg total) by mouth daily. TAKE WITH OR IMMEDIATELY FOLLOWING A MEAL.   TURMERIC CURCUMIN PO Take 750 mg by mouth daily.   VITAMIN D -VITAMIN K PO Take 250 mcg by mouth daily.   niacin 500 MG tablet Take 500 mg by mouth daily. (Patient not taking: Reported on 03/23/2024)   No facility-administered encounter medications on file as of 03/23/2024.   Hearing/Vision screen No results found. Immunizations and Health Maintenance Health Maintenance  Topic Date Due   Colonoscopy  07/04/2024   DTaP/Tdap/Td (1 - Tdap) 04/19/2024 (Originally 08/29/1969)   Zoster Vaccines- Shingrix  (2 of 2) 04/19/2024 (Originally 03/18/2023)   Influenza Vaccine  08/03/2024 (Originally 12/05/2023)   Pneumococcal Vaccine: 50+ Years (1 of 1 - PCV) 02/17/2025 (Originally 08/28/2001)   Medicare Annual Wellness (AWV)  03/23/2025   Mammogram  12/08/2025   DEXA SCAN  Completed   Hepatitis C Screening  Completed   Meningococcal B Vaccine  Aged Out   COVID-19 Vaccine  Discontinued        Assessment/Plan:  This is a routine wellness examination for [facility].  Patient Care Team: Domenica Harlene LABOR, MD as PCP - General (Family Medicine) Dyane Rush, MD (Inactive) as Consulting Physician (Gastroenterology) Schuyler Loan, DC (Inactive) as Referring Physician  (Chiropractic Medicine) de Dasie, [place], OD (Optometry)  I have personally reviewed and noted the following in the patient's chart:   Medical and social history Use of alcohol, tobacco or illicit drugs  Current medications and supplements including opioid prescriptions. Functional ability and status Nutritional status Physical activity Advanced directives List of other physicians Hospitalizations, surgeries, and ER visits in previous 12 months Vitals Screenings to include cognitive, depression, and falls Referrals and appointments  No orders of the defined types were placed in this encounter.  In addition, I have reviewed and discussed with patient certain preventive protocols, quality metrics, and best practice recommendations. A written personalized care plan for preventive services as well as general preventive health recommendations were provided to patient.   Lolita Libra, CMA   03/23/2024   Return in 1 year (on 03/23/2025).  After Visit Summary: (MyChart) Due to this being a telephonic visit, the after visit summary with patients personalized plan was offered to patient via MyChart   Nurse Notes: see phone note

## 2024-03-23 NOTE — Telephone Encounter (Signed)
 Pt had AWV today, scored 9 on PHQ-9 and reported that several of her responses were due to residual COVID effects. Continues to have balance issues, brain fog and fatigue (normal household tasks wipe her out). Mind will not shut down at night because I have too much to do and not enough time to do it.   She voices concern about the number of supplements and prescription medications she is taking. States she can't get them all in in a single day but has not tried to break medication up into other time periods (breakfast/lunch/dinner).    She is wanting PCP to review medication list and advise what supplements are essential or if there are any she can discontinue?  Would also like to know which medications can be split up to take at different times of day?  She states she has been on zyrtec  and pepcid  since Covid and wonders if she can discontinue these?  Pt has scheduled routine follow up with PCP in March.  Please advise?

## 2024-03-25 ENCOUNTER — Ambulatory Visit: Admitting: Audiology

## 2024-03-25 NOTE — Telephone Encounter (Signed)
 Notified pt of PCP recommendations and she voices understanding.  She is willing to come in earlier than March to discuss current health stressors and PHQ-9 score of 9.    Cherise -- are you able to work pt in with dr Domenica to discuss?

## 2024-03-26 NOTE — Telephone Encounter (Signed)
 Can you try to work pt in ? Cherise is out of the office

## 2024-03-26 NOTE — Telephone Encounter (Signed)
Pt scheduled for 1/8

## 2024-05-09 NOTE — Assessment & Plan Note (Signed)
 Encouraged good sleep hygiene such as dark, quiet room. No blue/green glowing lights such as computer screens in bedroom. No alcohol or stimulants in evening. Cut down on caffeine as able. Regular exercise is helpful but not just prior to bed time.

## 2024-05-09 NOTE — Assessment & Plan Note (Signed)
 Encourage heart healthy diet such as MIND or DASH diet, increase exercise, avoid trans fats, simple carbohydrates and processed foods, consider a krill or fish or flaxseed oil cap daily.

## 2024-05-09 NOTE — Assessment & Plan Note (Signed)
 Doing well on wellbutrin

## 2024-05-09 NOTE — Assessment & Plan Note (Signed)
Well controlled, no changes to meds, had run out of her Metoprolol and is well controlled without it so we will not restart Metoprolol. Encouraged heart healthy diet such as the DASH diet and exercise as tolerated.

## 2024-05-09 NOTE — Assessment & Plan Note (Signed)
 Encouraged to get adequate exercise, calcium and vitamin d intake

## 2024-05-09 NOTE — Progress Notes (Signed)
 "  Subjective:    Patient ID: Brittany Salinas, female    DOB: [birth year], 74 y.o.   MRN: [id number]  Chief Complaint  Patient presents with   Hypertension    Here for follow up    HPI Discussed the use of AI scribe software for clinical note transcription with the patient, who gave verbal consent to proceed.  History of Present Illness Brittany Salinas is a 74 year old female who presents with dizziness and right arm pain.  She experiences dizziness and balance issues, describing the sensation as 'dizzy, dizzy' rather than spinning or woozy. Her balance improves when she wears shoes with thin soles, allowing her to feel the ground beneath her feet. These episodes occur a couple of times every day without a specific pattern related to time of day or activity. She has had her eyes checked, which helped slightly, but she does not experience dizziness when outside, where she can control her footwear.  She has a history of right shoulder pain with decreased strength in her right arm and hand. She frequently wakes up with numb fingers on her right hand, which resolve after movement. She experiences chronic pain on the right side of her neck, which she attributes to her sleeping position and daily activities. She has arthritis in her right hand, which is painful and affects her ability to perform activities like cooking and gardening. She uses Voltaren gel up to four times a day for pain relief.  She has a history of memory issues and has previously undergone neurological evaluation, which did not reveal any pathological findings. She reports short-term memory difficulties and challenges with focus, particularly when reading. Her mother had some memory issues later in life, but there is no significant family history of dementia.  She mentions experiencing symptoms she attributes to long COVID, including a dry throat and a raspy voice, which affect her ability to sing. She has  undergone previous evaluations, including ENT and pulmonology, which did not yield significant findings. These symptoms are not associated with a chronic cough, but occur when her throat is dry or when she talks extensively.    Past Medical History:  Diagnosis Date   Blood transfusion without reported diagnosis 1997   Complications c-section   Cataracts, both eyes    Closed fracture of fifth metatarsal bone 12/14/2021   Closed fracture of fourth metatarsal bone 12/14/2021   Colon polyp 10/09/2017   Dyspareunia in female 11/16/2018   Family history of adverse reaction to anesthesia    mother and sister - slow to awaken   Gait disturbance 04/18/2016   Generalized anxiety disorder 09/17/2022   History of COVID-19 03/16/2021   Hyperlipidemia    Hypertension    Insomnia    Left elbow fracture 02/12/2020   Left knee pain 05/13/2017   Lumbar radiculopathy 01/22/2022   Lumbar spondylosis 03/05/2022   Major depressive disorder 04/18/2016   Neuromuscular disorder (HCC)    Obesity 04/13/2018   Osteoarthritis    Osteoporosis 01/21/2023   Pain in joint of right hip 01/04/2022   Right foot pain 10/09/2017   Subjective memory complaints 04/18/2016   Vaginitis 04/13/2018   Vertigo 01/29/2019   Vision abnormalities     Past Surgical History:  Procedure Laterality Date   BREAST BIOPSY Right    benign   BREAST REDUCTION SURGERY  2007   CESAREAN SECTION     4 c-sections   COLONOSCOPY  2008   COLONOSCOPY W/ POLYPECTOMY  11/15/2011  Dr. Gladies. [facility]. A single semi-pedunculated non-bleeding polyp of benigh apperance was found in the distal ascending colon. A single-piece polypectomy was performed using hot snare in the distal ascending colon. The polyp was completely removed.   EYE SURGERY Bilateral    catarcts   FRACTURE SURGERY  Oct 2021   Shattered elbow   ORIF ELBOW FRACTURE Left 02/12/2020   Procedure: Left elbow olecranon osteotomy, ulnar nerve release, and  surgical reconstruction distal end of the humerus with repair;  Surgeon: Camella Fallow, MD;  Location: [facility] OR;  Service: Orthopedics;  Laterality: Left;  2.5 hrs   REDUCTION MAMMAPLASTY Bilateral    ROTATOR CUFF REPAIR Bilateral    right in 2010, left in 2017.   TONSILLECTOMY     TUBAL LIGATION      Family History  Problem Relation Age of Onset   Colon polyps Mother    Heart defect Mother        valvular heart disease   Depression Mother    Memory loss Mother        vascular in nature   Heart attack Father    Hyperlipidemia Father    Hypertension Father    Heart disease Father    Colon polyps Sister    Marfan syndrome Sister    Other Sister        connective tissue disorder   Hypertension Brother    Other Brother        vertigo   Cancer Maternal Grandmother        stomach   Asthma Maternal Grandfather    Stroke Maternal Grandfather    Marfan syndrome Maternal Grandfather        ?   Alcohol abuse Maternal Grandfather    Psoriasis Daughter    Mental illness Son        depression alcohol in past   Depression Son    Arrhythmia Son    Alcohol abuse Son        h/o drug abuse   Breast cancer Maternal Aunt    Colon cancer Neg Hx    Esophageal cancer Neg Hx    Rectal cancer Neg Hx    Stomach cancer Neg Hx     Social History   Socioeconomic History   Marital status: Married    Spouse name: Not on file   Number of children: Not on file   Years of education: 17   Highest education level: Bachelor's degree (e.g., BA, AB, BS)  Occupational History   Occupation: Retired  Tobacco Use   Smoking status: Never   Smokeless tobacco: Never  Vaping Use   Vaping status: Never Used  Substance and Sexual Activity   Alcohol use: Yes    Alcohol/week: 2.0 standard drinks of alcohol    Types: 2 Glasses of wine per week    Comment: 1-2 drinks weekly   Drug use: No   Sexual activity: Not on file  Other Topics Concern   Not on file  Social History Narrative   Works as it sales professional, 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 Uses occasional light alcohol   Right handed   2 floor home   Social Drivers of Health   Tobacco Use: Low Risk (03/23/2024)   Patient History    Smoking Tobacco Use: Never    Smokeless Tobacco Use: Never    Passive Exposure: Not on file  Financial Resource Strain: Low Risk (02/18/2024)   Overall Financial Resource Strain (CARDIA)  Difficulty of Paying Living Expenses: Not hard at all  Food Insecurity: No Food Insecurity (03/23/2024)   Epic    Worried About Programme Researcher, Broadcasting/film/video in the Last Year: Never true    Ran Out of Food in the Last Year: Never true  Transportation Needs: No Transportation Needs (03/23/2024)   Epic    Lack of Transportation (Medical): No    Lack of Transportation (Non-Medical): No  Physical Activity: Insufficiently Active (03/23/2024)   Exercise Vital Sign    Days of Exercise per Week: 4 days    Minutes of Exercise per Session: 30 min  Stress: Stress Concern Present (03/23/2024)   [employer] of Occupational Health - Occupational Stress Questionnaire    Feeling of Stress: To some extent  Social Connections: Socially Integrated (03/23/2024)   Social Connection and Isolation Panel    Frequency of Communication with Friends and Family: Three times a week    Frequency of Social Gatherings with Friends and Family: Twice a week    Attends Religious Services: More than 4 times per year    Active Member of Clubs or Organizations: Yes    Attends Banker Meetings: More than 4 times per year    Marital Status: Married  Catering Manager Violence: Not At Risk (03/23/2024)   Epic    Fear of Current or Ex-Partner: No    Emotionally Abused: No    Physically Abused: No    Sexually Abused: No  Depression (PHQ2-9): Medium Risk (05/13/2024)   Depression (PHQ2-9)    PHQ-2 Score: 6  Alcohol Screen: Low Risk (02/18/2024)   Alcohol Screen    Last Alcohol Screening Score (AUDIT): 2   Housing: Low Risk (03/23/2024)   Epic    Unable to Pay for Housing in the Last Year: No    Number of Times Moved in the Last Year: 0    Homeless in the Last Year: No  Utilities: Not At Risk (03/23/2024)   Epic    Threatened with loss of utilities: No  Health Literacy: Not on file    Outpatient Medications Prior to Visit  Medication Sig Dispense Refill   aspirin  EC 81 MG tablet Take 81 mg by mouth daily. Swallow whole.     buPROPion  (WELLBUTRIN  XL) 300 MG 24 hr tablet Take 1 tablet (300 mg total) by mouth daily. 90 tablet 0   Krill Oil 500 MG CAPS Take 1,200 mg by mouth daily.     meclizine  (ANTIVERT ) 12.5 MG tablet Take 1 tablet (12.5 mg total) by mouth 3 (three) times daily as needed for dizziness. 30 tablet 0   Melatonin 10 MG TABS Take 5 mg by mouth at bedtime.     metoprolol  succinate (TOPROL -XL) 50 MG 24 hr tablet Take 1 tablet (50 mg total) by mouth daily. TAKE WITH OR IMMEDIATELY FOLLOWING A MEAL. 90 tablet 0   Aromatic Inhalants (INHALER DECONGESTANT IN) Inhale into the lungs.     Ascorbic Acid  (VITAMIN C) 1000 MG tablet Take 1,000 mg by mouth daily.     b complex vitamins capsule Take 1 capsule by mouth daily. 500mg      cetirizine  (ZYRTEC ) 10 MG tablet Take 1 tablet (10 mg total) by mouth daily. 30 tablet 11   famotidine  (PEPCID ) 20 MG tablet TAKE 1 TABLET BY MOUTH TWICE A DAY 180 tablet 1   fluticasone  (FLONASE ) 50 MCG/ACT nasal spray Place 2 sprays into both nostrils 2 (two) times daily. 16 g 6   magnesium oxide (MAG-OX) 400 MG  tablet Take 400 mg by mouth at bedtime.     niacin 500 MG tablet Take 500 mg by mouth daily. (Patient not taking: Reported on 03/23/2024)     TURMERIC CURCUMIN PO Take 750 mg by mouth daily.     VITAMIN D -VITAMIN K PO Take 250 mcg by mouth daily.     No facility-administered medications prior to visit.    Allergies[1]  Review of Systems  Constitutional:  Positive for malaise/fatigue. Negative for fever.  HENT:  Negative for congestion.    Eyes:  Negative for blurred vision.  Respiratory:  Negative for shortness of breath.   Cardiovascular:  Negative for chest pain, palpitations and leg swelling.  Gastrointestinal:  Negative for abdominal pain, blood in stool and nausea.  Genitourinary:  Negative for dysuria and frequency.  Musculoskeletal:  Positive for back pain and neck pain. Negative for falls.  Skin:  Negative for rash.  Neurological:  Positive for dizziness. Negative for loss of consciousness and headaches.  Endo/Heme/Allergies:  Negative for environmental allergies.  Psychiatric/Behavioral:  Positive for memory loss. Negative for depression. The patient is not nervous/anxious.        Objective:    Physical Exam Constitutional:      General: She is not in acute distress.    Appearance: Normal appearance. She is well-developed. She is not toxic-appearing.  HENT:     Head: Normocephalic and atraumatic.     Right Ear: External ear normal.     Left Ear: External ear normal.     Nose: Nose normal.  Eyes:     General:        Right eye: No discharge.        Left eye: No discharge.     Conjunctiva/sclera: Conjunctivae normal.  Neck:     Thyroid : No thyromegaly.  Cardiovascular:     Rate and Rhythm: Normal rate and regular rhythm.     Heart sounds: Normal heart sounds. No murmur heard. Pulmonary:     Effort: Pulmonary effort is normal. No respiratory distress.     Breath sounds: Normal breath sounds.  Abdominal:     General: Bowel sounds are normal.     Palpations: Abdomen is soft.     Tenderness: There is no abdominal tenderness. There is no guarding.  Musculoskeletal:        General: Normal range of motion.     Cervical back: Neck supple.  Lymphadenopathy:     Cervical: No cervical adenopathy.  Skin:    General: Skin is warm and dry.  Neurological:     Mental Status: She is alert and oriented to person, place, and time.  Psychiatric:        Mood and Affect: Mood normal.        Behavior: Behavior  normal.        Thought Content: Thought content normal.        Judgment: Judgment normal.     BP (!) 140/86   Pulse 61   Temp 98.7 F (37.1 C) (Oral)   Resp 16   Ht 5' 3 (1.6 m)   Wt 159 lb 12.8 oz (72.5 kg)   SpO2 100%   BMI 28.31 kg/m  Wt Readings from Last 3 Encounters:  05/13/24 159 lb 12.8 oz (72.5 kg)  03/23/24 164 lb (74.4 kg)  02/18/24 161 lb 6.4 oz (73.2 kg)    Diabetic Foot Exam - Simple   No data filed    Lab Results  Component Value Date  WBC 8.8 02/18/2024   HGB 13.0 02/18/2024   HCT 39.2 02/18/2024   PLT 230.0 02/18/2024   GLUCOSE 81 02/18/2024   CHOL 251 (H) 02/18/2024   TRIG 219.0 (H) 02/18/2024   HDL 51.80 02/18/2024   LDLDIRECT 99.0 08/30/2021   LDLCALC 155 (H) 02/18/2024   ALT 13 02/18/2024   AST 18 02/18/2024   NA 142 02/18/2024   K 4.3 02/18/2024   CL 105 02/18/2024   CREATININE 0.74 02/18/2024   BUN 17 02/18/2024   CO2 26 02/18/2024   TSH 3.11 02/18/2024    Lab Results  Component Value Date   TSH 3.11 02/18/2024   Lab Results  Component Value Date   WBC 8.8 02/18/2024   HGB 13.0 02/18/2024   HCT 39.2 02/18/2024   MCV 91.2 02/18/2024   PLT 230.0 02/18/2024   Lab Results  Component Value Date   NA 142 02/18/2024   K 4.3 02/18/2024   CO2 26 02/18/2024   GLUCOSE 81 02/18/2024   BUN 17 02/18/2024   CREATININE 0.74 02/18/2024   BILITOT 0.6 02/18/2024   ALKPHOS 65 02/18/2024   AST 18 02/18/2024   ALT 13 02/18/2024   PROT 6.8 02/18/2024   ALBUMIN 4.4 02/18/2024   CALCIUM  9.4 02/18/2024   ANIONGAP 11 02/12/2020   GFR 80.80 02/18/2024   Lab Results  Component Value Date   CHOL 251 (H) 02/18/2024   Lab Results  Component Value Date   HDL 51.80 02/18/2024   Lab Results  Component Value Date   LDLCALC 155 (H) 02/18/2024   Lab Results  Component Value Date   TRIG 219.0 (H) 02/18/2024   Lab Results  Component Value Date   CHOLHDL 5 02/18/2024   No results found for: HGBA1C     Assessment & Plan:   Osteoporosis, unspecified osteoporosis type, unspecified pathological fracture presence Assessment & Plan: Encouraged to get adequate exercise, calcium  and vitamin d  intake    Mild episode of recurrent major depressive disorder Assessment & Plan: Doing well on wellbutrin    Primary hypertension Assessment & Plan: Well controlled, no changes to meds, had run out of her Metoprolol  and is well controlled without it so we will not restart Metoprolol . Encouraged heart healthy diet such as the DASH diet and exercise as tolerated.    Hyperlipidemia, unspecified hyperlipidemia type Assessment & Plan: Encourage heart healthy diet such as MIND or DASH diet, increase exercise, avoid trans fats, simple carbohydrates and processed foods, consider a krill or fish or flaxseed oil cap daily.    Insomnia, unspecified type Assessment & Plan: Encouraged good sleep hygiene such as dark, quiet room. No blue/green glowing lights such as computer screens in bedroom. No alcohol or stimulants in evening. Cut down on caffeine as able. Regular exercise is helpful but not just prior to bed time.    Bunion, right foot -     Ambulatory referral to Podiatry  Bilateral foot pain -     Ambulatory referral to Podiatry  Hand pain, right -     Ambulatory referral to Sports Medicine  Right arm pain -     Ambulatory referral to Sports Medicine  Immunization due -     Flu vaccine HIGH DOSE PF(Fluzone Trivalent)    Assessment and Plan Assessment & Plan Balance disturbance and unsteady gait Intermittent dizziness and unsteady gait, possibly related to central nervous system processing issues. No specific pattern or triggers identified. Previous neurology evaluation did not find a pathologic cause. Consideration of podiatry consultation for  shoe recommendations due to thin soles preference. - Referred to podiatry for shoe recommendations - Will consider neurology evaluation if symptoms persist  Right upper  extremity pain and weakness due to osteoarthritis and possible thoracic outlet syndrome Chronic pain and weakness in the right shoulder, arm, and hand, with numbness in the right hand fingers. Possible thoracic outlet syndrome and osteoarthritis. Previous x-ray showed arthritis. Pain exacerbated by use, especially in the kitchen and during gardening. Discussed the role of physical therapy and sports medicine in managing symptoms and slowing arthritis progression. - Referred to sports medicine for evaluation of thoracic outlet syndrome and osteoarthritis - Continue Voltaren gel up to four times daily - Will consider physical therapy for symptom management  Bunion of right foot with bilateral foot pain Bunion on the right foot causing deformity and pain. Uses toe separators for comfort. No skin breakdown or sores reported. Discussed potential benefits of podiatry consultation for further management. - Referred to podiatry for evaluation and management of bunion  Primary hypertension Blood pressure slightly elevated at 142/86 mmHg. Previous readings at home were high normal. Metoprolol  prescribed but not taken consistently. Discussed the importance of consistent medication adherence to achieve target blood pressure range of 130s/80s. - Encouraged consistent use of metoprolol  - Monitor blood pressure at home or at a pharmacy - Will consider nurse visit for blood pressure check if needed  Hyperlipidemia Cholesterol levels previously noted to be elevated. No immediate need for repeat testing as it is 10 days shy of the 90-day window for retesting. - Will consider repeat cholesterol testing in a few weeks if desired  Major depressive disorder, recurrent, mild Mild depression potentially affecting memory and focus. Previous neurology evaluation did not find a pathologic cause for memory issues. Discussed the impact of depression on memory and focus, and the importance of addressing depressive  symptoms. 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 Previous evaluations by ENT and pulmonology did not find a definitive cause. Discussed the potential for long COVID symptoms to improve over time and the importance of preventive measures to avoid reinfection. - Continue annual COVID and flu vaccinations - Consider mask use in risky situations  General health maintenance Discussed the importance of vaccinations and preventive health measures. Consideration of RSV and Prevnar vaccines in the future. Discussed the benefits of shingles vaccination in reducing dementia risk. - Administered flu vaccine today - Consider RSV and Prevnar vaccines in the future - Consider second shingles vaccine to reduce dementia risk  Recording duration: 35 minutes     Harlene Horton, MD    [1]  Allergies Allergen Reactions   Shellfish Allergy Diarrhea and Other (See Comments)   Codeine Nausea And Vomiting   Crestor  [Rosuvastatin ]     Muscle aches   "

## 2024-05-13 ENCOUNTER — Ambulatory Visit: Payer: Self-pay | Admitting: Family Medicine

## 2024-05-13 VITALS — BP 140/86 | HR 61 | Temp 98.7°F | Resp 16 | Ht 63.0 in | Wt 159.8 lb

## 2024-05-13 DIAGNOSIS — M79672 Pain in left foot: Secondary | ICD-10-CM | POA: Diagnosis not present

## 2024-05-13 DIAGNOSIS — M79601 Pain in right arm: Secondary | ICD-10-CM

## 2024-05-13 DIAGNOSIS — M21611 Bunion of right foot: Secondary | ICD-10-CM

## 2024-05-13 DIAGNOSIS — M810 Age-related osteoporosis without current pathological fracture: Secondary | ICD-10-CM | POA: Diagnosis not present

## 2024-05-13 DIAGNOSIS — Z23 Encounter for immunization: Secondary | ICD-10-CM | POA: Diagnosis not present

## 2024-05-13 DIAGNOSIS — F330 Major depressive disorder, recurrent, mild: Secondary | ICD-10-CM | POA: Diagnosis not present

## 2024-05-13 DIAGNOSIS — I10 Essential (primary) hypertension: Secondary | ICD-10-CM | POA: Diagnosis not present

## 2024-05-13 DIAGNOSIS — G4700 Insomnia, unspecified: Secondary | ICD-10-CM

## 2024-05-13 DIAGNOSIS — M79671 Pain in right foot: Secondary | ICD-10-CM

## 2024-05-13 DIAGNOSIS — M79641 Pain in right hand: Secondary | ICD-10-CM | POA: Insufficient documentation

## 2024-05-13 DIAGNOSIS — E785 Hyperlipidemia, unspecified: Secondary | ICD-10-CM | POA: Diagnosis not present

## 2024-05-13 NOTE — Patient Instructions (Signed)
 Prevnar 20 once at pharmacy  Respiratory Syncitial Virus, RSV vaccine, Arexvy at pharmacy  Tetanus booster   Annual covid and flu boosters  Shingrix  is the new shingles shot, 2 shots over 2-6 months, confirm coverage with insurance and document, then can return here for shots with nurse appt or at pharmacy

## 2024-05-14 ENCOUNTER — Other Ambulatory Visit: Payer: Self-pay | Admitting: Family Medicine

## 2024-05-16 ENCOUNTER — Encounter: Payer: Self-pay | Admitting: Family Medicine

## 2024-05-17 ENCOUNTER — Ambulatory Visit: Payer: Self-pay | Attending: Student | Admitting: Physical Therapy

## 2024-05-17 ENCOUNTER — Other Ambulatory Visit: Payer: Self-pay

## 2024-05-17 DIAGNOSIS — M6281 Muscle weakness (generalized): Secondary | ICD-10-CM | POA: Diagnosis present

## 2024-05-17 DIAGNOSIS — R279 Unspecified lack of coordination: Secondary | ICD-10-CM | POA: Diagnosis present

## 2024-05-17 DIAGNOSIS — R269 Unspecified abnormalities of gait and mobility: Secondary | ICD-10-CM | POA: Diagnosis present

## 2024-05-17 DIAGNOSIS — R32 Unspecified urinary incontinence: Secondary | ICD-10-CM | POA: Insufficient documentation

## 2024-05-17 DIAGNOSIS — R4189 Other symptoms and signs involving cognitive functions and awareness: Secondary | ICD-10-CM | POA: Insufficient documentation

## 2024-05-17 DIAGNOSIS — R293 Abnormal posture: Secondary | ICD-10-CM | POA: Insufficient documentation

## 2024-05-17 DIAGNOSIS — M62838 Other muscle spasm: Secondary | ICD-10-CM | POA: Diagnosis present
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
## 2024-05-17 NOTE — Patient Instructions (Signed)

## 2024-05-18 NOTE — Progress Notes (Unsigned)
 "               Brittany Salinas D.[facility] [address] [place] [address] Phone: [phone]   Assessment and Plan:     1. Neck pain (Primary) 2. Right cervical radiculopathy 3. DDD (degenerative disc disease), cervical 4. Numbness and tingling of right upper extremity 5. Right arm pain -Chronic with exacerbation, initial sports medicine visit - Consistent with degenerative changes in cervical spine leading to right sided cervical radiculopathy, present for years, no specific MOI, fluctuating intensity. - X-ray obtained in clinic.  My interpretation: No acute fracture or vertebral collapse.  Degenerative changes most advanced at C4-C5.  Additional degenerative changes present C6-T1 - Start meloxicam  15 mg daily x2 weeks.  If still having pain after 2 weeks, complete 3rd-week of NSAID. May use remaining NSAID as needed once daily for pain control.  Do not to use additional over-the-counter NSAIDs (ibuprofen, naproxen, Advil, Aleve, etc.) while taking prescription NSAIDs.  May use Tylenol  [phone] mg 2 to 3 times a day for breakthrough pain. - Start HEP for neck, trapezius  15 additional minutes spent for educating Therapeutic Home Exercise Program.  This included exercises focusing on stretching, strengthening, with focus on eccentric aspects.   Long term goals include an improvement in range of motion, strength, endurance as well as avoiding reinjury. Patient's frequency would include in 1-2 times a day, 3-5 times a week for a duration of 6-12 weeks. Proper technique shown and discussed handout in great detail with ATC.  All questions were discussed and answered.    6. Right hand pain 7. Primary osteoarthritis of first carpometacarpal joint of right hand -Chronic with exacerbation, initial sports medicine visit - Chronic right hand pain, swelling consistent with [facility] osteoarthritis - X-ray obtained in clinic.  My interpretation: No acute fracture or dislocation.   Advanced degenerative changes at [facility] joint.  Additional degenerative changes at IP and DIP joints - Start meloxicam  15 mg daily x2 weeks.  If still having pain after 2 weeks, complete 3rd-week of NSAID. May use remaining NSAID as needed once daily for pain control.  Do not to use additional over-the-counter NSAIDs (ibuprofen, naproxen, Advil, Aleve, etc.) while taking prescription NSAIDs.  May use Tylenol  [phone] mg 2 to 3 times a day for breakthrough pain. -May use topical Voltaren gel over areas of pain    Pertinent previous records reviewed include family medicine note 05/13/2024   Follow Up: 4 to 6 weeks for reevaluation.  Could consider MRI C-spine versus physical therapy   Subjective:   I, Brittany Salinas, am serving as a neurosurgeon for Doctor Brittany Salinas  Chief Complaint: right arm and neck pain   HPI:   05/19/2024 Patient is a 74 year old female with right arm and neck pain. Patient states pain started a few years ago. Pain in the shoulder radiates down the arm and to the hand. She has numbness and tingling in her hands when she wakes. Decreased grip strength. Decreased ROM. Ibu for the pain and that helps. Decreased ROM at the neck. Right side is in pain. Hx of bilat rotator cuff surgery.    Relevant Historical Information: Hypertension  Additional pertinent review of systems negative.  Current Medications[1]   Objective:     Vitals:   05/19/24 1001  BP: 126/82  Pulse: 67  SpO2: 98%  Weight: 159 lb (72.1 kg)  Height: 5' 3 (1.6 m)      Body mass index is 28.17  kg/m.    Physical Exam:     Neck Exam: Cervical Spine- Posture normal Skin- normal, intact  Neuro:  Strength-  Right Left   Deltoid (C5) 5/5 5/5  Bicep/Brachioradialis (C5/6) 5/5  5/5  Wrist Extension (C6) 5/5 5/5  Tricep (C7) 5/5 5/5  Wrist Flexion (C7) 5/5 5/5  Grip (C8) 5/5 5/5  Finger Abduction (T1) 5/5 5/5   Sensation: intact to light touch in upper extremities bilaterally  Spurling's:   negative bilaterally Neck ROM: Full active ROM TTP: Right cervical paraspinal, right trapezius NTTP: cervical spinous processes, left cervical paraspinal, thoracic paraspinal, left trapezius   Electronically signed by:  Brittany Salinas D.[facility] 10:36 AM 05/19/2024     [1]  Current Outpatient Medications:    aspirin  EC 81 MG tablet, Take 81 mg by mouth daily. Swallow whole., Disp: , Rfl:    buPROPion  (WELLBUTRIN  XL) 300 MG 24 hr tablet, Take 1 tablet (300 mg total) by mouth daily., Disp: 90 tablet, Rfl: 0   Krill Oil 500 MG CAPS, Take 1,200 mg by mouth daily., Disp: , Rfl:    meclizine  (ANTIVERT ) 12.5 MG tablet, Take 1 tablet (12.5 mg total) by mouth 3 (three) times daily as needed for dizziness., Disp: 30 tablet, Rfl: 0   Melatonin 10 MG TABS, Take 5 mg by mouth at bedtime., Disp: , Rfl:    meloxicam  (MOBIC ) 15 MG tablet, Take 1 tablet daily for 2 weeks.  If still in pain after 2 weeks, take 1 tablet daily for an additional 1 week., Disp: 30 tablet, Rfl: 0   metoprolol  succinate (TOPROL -XL) 50 MG 24 hr tablet, Take 1 tablet (50 mg total) by mouth daily. TAKE WITH OR IMMEDIATELY FOLLOWING A MEAL., Disp: 90 tablet, Rfl: 1  "

## 2024-05-19 ENCOUNTER — Ambulatory Visit

## 2024-05-19 ENCOUNTER — Ambulatory Visit: Admitting: Sports Medicine

## 2024-05-19 VITALS — BP 126/82 | HR 67 | Ht 63.0 in | Wt 159.0 lb

## 2024-05-19 DIAGNOSIS — M79601 Pain in right arm: Secondary | ICD-10-CM

## 2024-05-19 DIAGNOSIS — M79641 Pain in right hand: Secondary | ICD-10-CM

## 2024-05-19 DIAGNOSIS — M542 Cervicalgia: Secondary | ICD-10-CM

## 2024-05-19 DIAGNOSIS — M1811 Unilateral primary osteoarthritis of first carpometacarpal joint, right hand: Secondary | ICD-10-CM

## 2024-05-19 DIAGNOSIS — M5412 Radiculopathy, cervical region: Secondary | ICD-10-CM

## 2024-05-19 DIAGNOSIS — R202 Paresthesia of skin: Secondary | ICD-10-CM

## 2024-05-19 DIAGNOSIS — R200 Anesthesia of skin: Secondary | ICD-10-CM

## 2024-05-19 DIAGNOSIS — M5030 Other cervical disc degeneration, unspecified cervical region: Secondary | ICD-10-CM

## 2024-05-19 MED ORDER — MELOXICAM 15 MG PO TABS: ORAL_TABLET | ORAL | 0 refills | Status: AC

## 2024-05-19 NOTE — Patient Instructions (Signed)
-   Start meloxicam  15 mg daily x2 weeks.  If still having pain after 2 weeks, complete 3rd-week of NSAID. May use remaining NSAID as needed once daily for pain control.  Do not to use additional over-the-counter NSAIDs (ibuprofen, naproxen, Advil, Aleve, etc.) while taking prescription NSAIDs.  May use Tylenol  [phone] mg 2 to 3 times a day for breakthrough pain.  May use Voltaren gel over hand for pain relief  Neck HEP  4-6 week follow up

## 2024-05-25 ENCOUNTER — Ambulatory Visit: Payer: Self-pay | Admitting: Sports Medicine

## 2024-06-02 ENCOUNTER — Ambulatory Visit: Admitting: Physical Therapy

## 2024-06-09 ENCOUNTER — Telehealth: Payer: Self-pay | Admitting: Physical Therapy

## 2024-06-09 ENCOUNTER — Ambulatory Visit: Admitting: Physical Therapy

## 2024-06-09 NOTE — Telephone Encounter (Signed)
 PT called pt about this morning's appointment at 845. Pt made aware of next appointment and plans to be there.    Darryle Navy, PT, DPT 02/04/269:30 AM  [facility] [address], [address] [place], [place] [address] Phone # [phone] Fax [phone]

## 2024-06-15 ENCOUNTER — Ambulatory Visit: Admitting: Physical Therapy

## 2024-06-16 ENCOUNTER — Ambulatory Visit: Admitting: Sports Medicine

## 2024-06-22 ENCOUNTER — Ambulatory Visit: Admitting: Physical Therapy

## 2024-06-29 ENCOUNTER — Ambulatory Visit: Admitting: Physical Therapy

## 2024-07-06 ENCOUNTER — Ambulatory Visit: Admitting: Physical Therapy

## 2024-07-08 ENCOUNTER — Ambulatory Visit: Admitting: Family Medicine

## 2024-07-13 ENCOUNTER — Ambulatory Visit: Admitting: Physical Therapy

## 2024-12-09 ENCOUNTER — Encounter: Admitting: Family Medicine

## 2025-03-25 ENCOUNTER — Ambulatory Visit
